# Patient Record
Sex: Male | Born: 1940 | Race: White | Hispanic: No | Marital: Married | Smoking: Never smoker
Health system: Southern US, Community
[De-identification: ages and names within clinical notes are randomized; demographics above are authoritative.]

## PROBLEM LIST (undated history)

## (undated) DIAGNOSIS — E119 Type 2 diabetes mellitus without complications: Secondary | ICD-10-CM

## (undated) DIAGNOSIS — T8859XA Other complications of anesthesia, initial encounter: Secondary | ICD-10-CM

## (undated) DIAGNOSIS — I499 Cardiac arrhythmia, unspecified: Secondary | ICD-10-CM

## (undated) DIAGNOSIS — R51 Headache: Secondary | ICD-10-CM

## (undated) DIAGNOSIS — R519 Headache, unspecified: Secondary | ICD-10-CM

## (undated) DIAGNOSIS — Z87442 Personal history of urinary calculi: Secondary | ICD-10-CM

## (undated) DIAGNOSIS — N200 Calculus of kidney: Secondary | ICD-10-CM

## (undated) DIAGNOSIS — D649 Anemia, unspecified: Secondary | ICD-10-CM

## (undated) DIAGNOSIS — I10 Essential (primary) hypertension: Secondary | ICD-10-CM

## (undated) DIAGNOSIS — C801 Malignant (primary) neoplasm, unspecified: Secondary | ICD-10-CM

## (undated) DIAGNOSIS — T4145XA Adverse effect of unspecified anesthetic, initial encounter: Secondary | ICD-10-CM

## (undated) DIAGNOSIS — I639 Cerebral infarction, unspecified: Secondary | ICD-10-CM

## (undated) HISTORY — PX: HERNIA REPAIR: SHX51

## (undated) HISTORY — DX: Calculus of kidney: N20.0

## (undated) HISTORY — PX: EYE SURGERY: SHX253

---

## 1987-01-11 HISTORY — PX: HAND SURGERY: SHX662

## 1998-12-15 ENCOUNTER — Ambulatory Visit (HOSPITAL_COMMUNITY): Admission: RE | Admit: 1998-12-15 | Discharge: 1998-12-15 | Payer: Self-pay

## 2003-04-01 ENCOUNTER — Encounter: Admission: RE | Admit: 2003-04-01 | Discharge: 2003-04-01 | Payer: Self-pay | Admitting: Internal Medicine

## 2004-02-16 ENCOUNTER — Other Ambulatory Visit: Payer: Self-pay

## 2004-02-17 ENCOUNTER — Observation Stay: Payer: Self-pay | Admitting: Internal Medicine

## 2004-11-22 ENCOUNTER — Emergency Department: Payer: Self-pay | Admitting: Emergency Medicine

## 2004-12-09 ENCOUNTER — Ambulatory Visit: Payer: Self-pay | Admitting: Unknown Physician Specialty

## 2006-06-01 ENCOUNTER — Ambulatory Visit: Payer: Self-pay | Admitting: Internal Medicine

## 2006-09-26 ENCOUNTER — Ambulatory Visit: Payer: Self-pay | Admitting: Urology

## 2006-10-07 ENCOUNTER — Encounter: Admission: RE | Admit: 2006-10-07 | Discharge: 2006-10-07 | Payer: Self-pay | Admitting: Internal Medicine

## 2007-03-27 ENCOUNTER — Ambulatory Visit: Payer: Self-pay | Admitting: Urology

## 2008-01-11 HISTORY — PX: HERNIA REPAIR: SHX51

## 2008-04-14 ENCOUNTER — Ambulatory Visit: Payer: Self-pay | Admitting: Surgery

## 2008-06-24 ENCOUNTER — Ambulatory Visit: Payer: Self-pay | Admitting: Urology

## 2011-01-21 ENCOUNTER — Ambulatory Visit: Payer: Self-pay | Admitting: Internal Medicine

## 2011-09-27 ENCOUNTER — Other Ambulatory Visit: Payer: Self-pay | Admitting: Podiatry

## 2011-10-02 LAB — WOUND CULTURE

## 2013-05-13 ENCOUNTER — Other Ambulatory Visit: Payer: Self-pay | Admitting: Internal Medicine

## 2013-05-13 DIAGNOSIS — M545 Low back pain, unspecified: Secondary | ICD-10-CM

## 2013-05-13 DIAGNOSIS — D510 Vitamin B12 deficiency anemia due to intrinsic factor deficiency: Secondary | ICD-10-CM | POA: Insufficient documentation

## 2013-05-13 DIAGNOSIS — G629 Polyneuropathy, unspecified: Secondary | ICD-10-CM | POA: Insufficient documentation

## 2013-05-13 DIAGNOSIS — Z139 Encounter for screening, unspecified: Secondary | ICD-10-CM

## 2013-05-16 ENCOUNTER — Ambulatory Visit
Admission: RE | Admit: 2013-05-16 | Discharge: 2013-05-16 | Disposition: A | Discharge status: Home / Self Care | Payer: No Typology Code available for payment source | Source: Ambulatory Visit | Attending: Internal Medicine | Admitting: Internal Medicine

## 2013-05-16 DIAGNOSIS — Z139 Encounter for screening, unspecified: Secondary | ICD-10-CM

## 2013-05-16 DIAGNOSIS — M545 Low back pain, unspecified: Secondary | ICD-10-CM

## 2013-06-17 DIAGNOSIS — M5416 Radiculopathy, lumbar region: Secondary | ICD-10-CM | POA: Insufficient documentation

## 2014-10-28 ENCOUNTER — Other Ambulatory Visit: Payer: Self-pay | Admitting: Neurology

## 2014-10-28 DIAGNOSIS — R404 Transient alteration of awareness: Secondary | ICD-10-CM

## 2014-10-29 ENCOUNTER — Ambulatory Visit
Admission: RE | Admit: 2014-10-29 | Discharge: 2014-10-29 | Disposition: A | Discharge status: Home / Self Care | Payer: Medicare Other | Source: Ambulatory Visit | Attending: Neurology | Admitting: Neurology

## 2014-10-29 DIAGNOSIS — R404 Transient alteration of awareness: Secondary | ICD-10-CM

## 2014-10-29 MED ORDER — GADOBENATE DIMEGLUMINE 529 MG/ML IV SOLN
19.0000 mL | Freq: Once | INTRAVENOUS | Status: AC | PRN
  Administered 2014-10-29: 19 mL via INTRAVENOUS

## 2015-08-18 ENCOUNTER — Other Ambulatory Visit: Payer: Self-pay | Admitting: Internal Medicine

## 2015-08-18 DIAGNOSIS — M5412 Radiculopathy, cervical region: Secondary | ICD-10-CM

## 2015-08-27 ENCOUNTER — Other Ambulatory Visit: Payer: Medicare Other

## 2015-09-15 ENCOUNTER — Ambulatory Visit
Admission: RE | Admit: 2015-09-15 | Discharge: 2015-09-15 | Disposition: A | Discharge status: Home / Self Care | Payer: Medicare Other | Source: Ambulatory Visit | Attending: Internal Medicine | Admitting: Internal Medicine

## 2015-09-15 ENCOUNTER — Other Ambulatory Visit: Payer: Self-pay | Admitting: Internal Medicine

## 2015-09-15 DIAGNOSIS — Z77018 Contact with and (suspected) exposure to other hazardous metals: Secondary | ICD-10-CM

## 2015-09-15 DIAGNOSIS — M5412 Radiculopathy, cervical region: Secondary | ICD-10-CM

## 2015-11-19 DIAGNOSIS — I10 Essential (primary) hypertension: Secondary | ICD-10-CM | POA: Insufficient documentation

## 2015-11-19 DIAGNOSIS — M5010 Cervical disc disorder with radiculopathy, unspecified cervical region: Secondary | ICD-10-CM | POA: Insufficient documentation

## 2016-08-06 ENCOUNTER — Emergency Department: Payer: Medicare Other

## 2016-08-06 ENCOUNTER — Emergency Department
Admission: EM | Admit: 2016-08-06 | Discharge: 2016-08-06 | Disposition: A | Discharge status: Home / Self Care | Payer: Medicare Other | Attending: Emergency Medicine | Admitting: Emergency Medicine

## 2016-08-06 DIAGNOSIS — Y9279 Other farm location as the place of occurrence of the external cause: Secondary | ICD-10-CM | POA: Diagnosis not present

## 2016-08-06 DIAGNOSIS — S92001A Unspecified fracture of right calcaneus, initial encounter for closed fracture: Secondary | ICD-10-CM | POA: Diagnosis not present

## 2016-08-06 DIAGNOSIS — W1789XA Other fall from one level to another, initial encounter: Secondary | ICD-10-CM | POA: Diagnosis not present

## 2016-08-06 DIAGNOSIS — Y93H9 Activity, other involving exterior property and land maintenance, building and construction: Secondary | ICD-10-CM | POA: Diagnosis not present

## 2016-08-06 DIAGNOSIS — Y999 Unspecified external cause status: Secondary | ICD-10-CM | POA: Diagnosis not present

## 2016-08-06 DIAGNOSIS — S92009A Unspecified fracture of unspecified calcaneus, initial encounter for closed fracture: Secondary | ICD-10-CM

## 2016-08-06 DIAGNOSIS — R52 Pain, unspecified: Secondary | ICD-10-CM

## 2016-08-06 DIAGNOSIS — S99911A Unspecified injury of right ankle, initial encounter: Secondary | ICD-10-CM | POA: Diagnosis present

## 2016-08-06 MED ORDER — OXYCODONE-ACETAMINOPHEN 5-325 MG PO TABS
1.0000 | ORAL_TABLET | Freq: Once | ORAL | Status: AC
  Administered 2016-08-06: 1 via ORAL
  Filled 2016-08-06: qty 1

## 2016-08-06 MED ORDER — ONDANSETRON HCL 4 MG/2ML IJ SOLN
4.0000 mg | Freq: Once | INTRAMUSCULAR | Status: AC
  Administered 2016-08-06: 4 mg via INTRAVENOUS
  Filled 2016-08-06: qty 2

## 2016-08-06 MED ORDER — OXYCODONE-ACETAMINOPHEN 5-325 MG PO TABS
1.0000 | ORAL_TABLET | Freq: Four times a day (QID) | ORAL | 0 refills | Status: DC | PRN
Start: 2016-08-06 — End: 2017-10-11

## 2016-08-06 MED ORDER — ONDANSETRON 4 MG PO TBDP: 4.0000 mg | ORAL_TABLET | Freq: Three times a day (TID) | ORAL | 0 refills | Status: DC | PRN

## 2016-08-06 NOTE — Discharge Instructions (Signed)
Please call the number provided for Dr. Sabra Heck for a follow-up appointment as soon as possible. Do not bear weight on your right ankle. Use crutches while walking. Please take your pain medication as needed, as written.

## 2016-08-06 NOTE — ED Notes (Signed)
Kurt Hernandez is in with pt to place splint. Crutches in room.

## 2016-08-06 NOTE — ED Triage Notes (Signed)
Pt was on the trailer helping to push off bales of hay (1400lb bales) had to jump off trailer out of the way of a falling bale and as he landed (approx jumping 3-4') he heard a pop and had pain and felt nauseated and sweaty.

## 2016-08-06 NOTE — ED Notes (Signed)
Pt taken out to car by tech after splint applied

## 2016-08-06 NOTE — ED Provider Notes (Signed)
[facility] Emergency Department Provider Note  Time seen: 7:42 PM  I have reviewed the triage vital signs and the nursing notes.   HISTORY  Chief Complaint Ankle Pain    HPI Kurt Hernandez is a 76 y.o. male with a very minimal past medical history of palpitations who presents to the emergency department with right ankle pain. According to the patient he was loading hay, states he slipped and had to jump off the top of the [employer] trailer approximately 8 feet off the ground. Patient states he landed on his right leg and rolled forwards. States immediate pain to the right heel denies any other injuries. Did not hit his head or pass out. Denies vomiting. States significant pain with attempted ambulation.  No past medical history on file.  There are no active problems to display for this patient.   No past surgical history on file.  Prior to Admission medications   Not on File    Not on File  No family history on file.  Social History Social History  Substance Use Topics  . Smoking status: Not on file  . Smokeless tobacco: Not on file  . Alcohol use Not on file    Review of Systems Constitutional: Negative for fever. Cardiovascular: Negative for chest pain. Respiratory: Negative for shortness of breath. Gastrointestinal: Negative for abdominal pain Genitourinary: Negative for dysuria. Musculoskeletal: Right heel pain Neurological: Negative for headache All other ROS negative  ____________________________________________   PHYSICAL EXAM:  VITAL SIGNS: ED Triage Vitals [08/06/16 1856]  Enc Vitals Group     BP 137/75     Pulse Rate (!) 51     Resp 18     Temp 98 F (36.7 C)     Temp src      SpO2 100 %     Weight 212 lb (96.2 kg)     Height 6\' 2"  (1.88 m)     Head Circumference      Peak Flow      Pain Score 3     Pain Loc      Pain Edu?      Excl. in [place]?     Constitutional: Alert and oriented. Well appearing and in no  distress. Eyes: Normal exam ENT   Head: Normocephalic and atraumatic.   Mouth/Throat: Mucous membranes are moist. Cardiovascular: Normal rate, regular rhythm. No murmur Respiratory: Normal respiratory effort without tachypnea nor retractions. Breath sounds are clear  Gastrointestinal: Soft and nontender. No distention. Musculoskeletal: Moderate tenderness of the right calcaneus. Neurologic:  Normal speech and language. No gross focal neurologic deficits are appreciated. Skin:  Skin is warm, dry and intact.  Psychiatric: Mood and affect are normal. Speech and behavior are normal.   ____________________________________________   RADIOLOGY   IMPRESSION: Comminuted and impacted fractures of the right calcaneus.  ____________________________________________   INITIAL IMPRESSION / ASSESSMENT AND PLAN / ED COURSE  Pertinent labs & imaging results that were available during my care of the patient were reviewed by me and considered in my medical decision making (see chart for details).  Patient presents to the emergency department with right heel pain after a fall from approximately 8 feet. No other injuries identified on examination. No other pain complaints. Patient has a comminuted and impacted right calcaneal fracture. I discussed the patient with orthopedics. They recommend splint/boot, nonweightbearing status. Patient sees Dr. Sabra Heck as his orthopedist we will have him follow-up with Dr. Sabra Heck this week. We will discharge pain medication. Orthopedics  recommends obtaining a CT scan in the emergency department which will be useful for possible operative planning if needed by Dr. Sabra Heck. Patient agreeable to this plan. States moderate pain at this time we will discharge with Percocet.  ____________________________________________   FINAL CLINICAL IMPRESSION(S) / ED DIAGNOSES  Right calcaneus fracture    Harvest Dark, MD 08/06/16 2010

## 2016-08-11 ENCOUNTER — Emergency Department
Admission: EM | Admit: 2016-08-11 | Discharge: 2016-08-11 | Disposition: A | Discharge status: Home / Self Care | Payer: Medicare Other | Attending: Emergency Medicine | Admitting: Emergency Medicine

## 2016-08-11 ENCOUNTER — Emergency Department: Payer: Medicare Other

## 2016-08-11 ENCOUNTER — Encounter: Payer: Self-pay | Admitting: Emergency Medicine

## 2016-08-11 DIAGNOSIS — R339 Retention of urine, unspecified: Secondary | ICD-10-CM | POA: Insufficient documentation

## 2016-08-11 DIAGNOSIS — Z79899 Other long term (current) drug therapy: Secondary | ICD-10-CM | POA: Insufficient documentation

## 2016-08-11 DIAGNOSIS — K5900 Constipation, unspecified: Secondary | ICD-10-CM

## 2016-08-11 DIAGNOSIS — K5641 Fecal impaction: Secondary | ICD-10-CM | POA: Insufficient documentation

## 2016-08-11 DIAGNOSIS — I10 Essential (primary) hypertension: Secondary | ICD-10-CM | POA: Insufficient documentation

## 2016-08-11 HISTORY — DX: Essential (primary) hypertension: I10

## 2016-08-11 HISTORY — DX: Cardiac arrhythmia, unspecified: I49.9

## 2016-08-11 MED ORDER — MAGNESIUM CITRATE PO SOLN
0.5000 | Freq: Once | ORAL | Status: AC
  Administered 2016-08-11: 0.5 via ORAL
  Filled 2016-08-11: qty 296

## 2016-08-11 MED ORDER — DOCUSATE SODIUM 50 MG/5ML PO LIQD
100.0000 mg | Freq: Once | ORAL | Status: AC
  Administered 2016-08-11: 100 mg
  Filled 2016-08-11: qty 10

## 2016-08-11 NOTE — ED Notes (Signed)
1/2 enema given at this time.  Pt tolerating only half at this time, stated he was starting to feel nauseated.  Pt instructed to hold liquid for as long as possible and to call out when he felt that he needed to have a BM.  Call bell given to patient.  Sign placed on patient's door for privacy.  Pt verbalized understanding of using call bell.

## 2016-08-11 NOTE — ED Triage Notes (Addendum)
Pt here Saturday because broke leg.  Took 3 hydrocodone but has not had bowel movement since then.  Tried fleets over the counter and stool softener without relief.  No vomiting but has had some mild nausea.  Pt feels like most of stool is stuck at rectum.

## 2016-08-11 NOTE — ED Notes (Signed)
Pt had BM on toilet, pt states that he feels relief in his stomach, but doesn't know if he passed all of the stool.  Pt states that he feels like his butt is extended outward and needs to retract back.  Pt asked if he has a history of hemorrhoids and he states no.  Pt states he is no longer having pain like he was prior to coming in.

## 2016-08-11 NOTE — ED Provider Notes (Signed)
[facility] Emergency Department Provider Note  ____________________________________________   First MD Initiated Contact with Patient 08/11/16 1103     (approximate)  I have reviewed the triage vital signs and the nursing notes.   HISTORY  Chief Complaint Constipation   HPI Kurt Hernandez is a 76 y.o. male with a history of recent right foot fracture who is presenting to the emergency department with constipation. He says that he normally has a bowel movement every morning but has not had a bowel movement since this past Saturday, 5 days ago. He says that he now feels like there is something in his rectum that he just can't push out. He has tried to disimpact himself at home and has been able to get a minimal amount of stool out but is still unable to have a normal bowel movement. After breaking his foot he has been walking minimally because of recommendations not to bear any weight to the right foot. He is also been taking hydrocodone. He has had no other changes in his diet. He has pain that feels like a pressure type pain in his suprapubic region. Has not had any vomiting. Says he is able to urinate.Patient says that he has been taking stool softeners but without relief.   Past Medical History:  Diagnosis Date  . Arrhythmia   . Hypertension     There are no active problems to display for this patient.   Past Surgical History:  Procedure Laterality Date  . HAND SURGERY    . HERNIA REPAIR      Prior to Admission medications   Medication Sig Start Date End Date Taking? Authorizing Provider  gabapentin (NEURONTIN) 300 MG capsule Take 300 mg by mouth 3 (three) times daily. 08/03/16  Yes [provider]  metoprolol succinate (TOPROL-XL) 25 MG 24 hr tablet Take 12.5 mg by mouth 2 (two) times daily. 07/20/16  Yes [provider]  olmesartan-hydrochlorothiazide (BENICAR HCT) 20-12.5 MG tablet Take 1 tablet by mouth daily. 06/01/16  Yes  [provider]  ondansetron (ZOFRAN ODT) 4 MG disintegrating tablet Take 1 tablet (4 mg total) by mouth every 8 (eight) hours as needed for nausea or vomiting. 08/06/16  Yes Harvest Dark, MD  oxyCODONE-acetaminophen (ROXICET) 5-325 MG tablet Take 1 tablet by mouth every 6 (six) hours as needed. 08/06/16  Yes Harvest Dark, MD    Allergies Patient has no known allergies.  History reviewed. No pertinent family history.  Social History Social History  Substance Use Topics  . Smoking status: Never Smoker  . Smokeless tobacco: Never Used  . Alcohol use No    Review of Systems  Constitutional: No fever/chills Eyes: No visual changes. ENT: No sore throat. Cardiovascular: Denies chest pain. Respiratory: Denies shortness of breath. Gastrointestinal:  No nausea, no vomiting.  No diarrhea.   Genitourinary: Negative for dysuria. Musculoskeletal: Negative for back pain. Skin: Negative for rash. Neurological: Negative for headaches, focal weakness or numbness.   ____________________________________________   PHYSICAL EXAM:  VITAL SIGNS: ED Triage Vitals  Enc Vitals Group     BP 08/11/16 1037 101/64     Pulse Rate 08/11/16 1037 64     Resp 08/11/16 1037 18     Temp 08/11/16 1036 98.1 F (36.7 C)     Temp Source 08/11/16 1036 Oral     SpO2 08/11/16 1037 96 %     Weight 08/11/16 1037 212 lb (96.2 kg)     Height 08/11/16 1037 6\' 2"  (1.88  m)     Head Circumference --      Peak Flow --      Pain Score 08/11/16 1036 8     Pain Loc --      Pain Edu? --      Excl. in [place]? --     Constitutional: Alert and oriented. Well appearing and in no acute distress. Eyes: Conjunctivae are normal.  Head: Atraumatic. Nose: No congestion/rhinnorhea. Mouth/Throat: Mucous membranes are moist.  Neck: No stridor.   Cardiovascular: Normal rate, regular rhythm. Grossly normal heart sounds. Respiratory: Normal respiratory effort.  No retractions. Lungs CTAB. Gastrointestinal:  Soft and nontender. No distention.   Digital rectal exam with stool that is high up in the rectum that I'm unable to disimpact. The stool is hard and there appears to be a large mass there. There is a small amount of liquid stool that came out of the patient's rectum after the exam. Stool is grossly brown.  Musculoskeletal: No lower extremity tenderness nor edema.  No joint effusions. Neurologic:  Normal speech and language. No gross focal neurologic deficits are appreciated. Skin:  Skin is warm, dry and intact. No rash noted. Psychiatric: Mood and affect are normal. Speech and behavior are normal.  ____________________________________________   LABS (all labs ordered are listed, but only abnormal results are displayed)  Labs Reviewed - No data to display ____________________________________________  EKG   ____________________________________________  RADIOLOGY  Abdominal x-ray with a right upper pole nephrolithiasis. ____________________________________________   PROCEDURES  Procedure(s) performed:   Procedures  Critical Care performed:   ____________________________________________   INITIAL IMPRESSION / ASSESSMENT AND PLAN / ED COURSE  Pertinent labs & imaging results that were available during my care of the patient were reviewed by me and considered in my medical decision making (see chart for details).    Clinical Course as of Aug 11 1605  Thu Aug 11, 2016  1111 DG Abdomen 1 View [DS]  2440 DG Abdomen 1 View [DS]  1027 DG Abdomen 1 View [DS]    Clinical Course User Index [DS] Orbie Pyo, MD   ----------------------------------------- 11:40 AM on 08/11/2016 -----------------------------------------  Patient to receive enema.  ----------------------------------------- 2:59 PM on 08/11/2016 -----------------------------------------  Patient had some success with the enema but was only able to tolerate half of the back. He says now that  he is unable to urinate and still feels a pressure-like sensation in his abdomen and also like his rectum is coming out. However, after resting he says that the sensation has resolved.  I reexamined his rectum and there is no prolapse. However, on digital exam I can still feel hard stool high up in the rectum. He had a bladder scan showing greater than 500 cc of urine that he was retaining. Likely caused by the persistent fecal impaction.  ----------------------------------------- 4:07 PM on 08/11/2016 -----------------------------------------  After the second enema patient had a large bowel movement and was also able to void. He is feeling much improved. He'll be discharged home. I discussed with him and his family to stop taking his hydrocodone and also to make sure to drink water as well as supplement his diet with fiber. He is understanding of this plan and willing to comply. He will continue as needed with his over-the-counter stool softeners, he is currently taking senna but we discussed advancing to MiraLAX or magnesium if needed. The patient is understanding of this plan and willing to comply. ____________________________________________   FINAL CLINICAL IMPRESSION(S) / ED DIAGNOSES  Fecal  impaction.    NEW MEDICATIONS STARTED DURING THIS VISIT:  New Prescriptions   No medications on file     Note:  This document was prepared using Dragon voice recognition software and may include unintentional dictation errors.     Orbie Pyo, MD 08/11/16 [phone]

## 2016-08-11 NOTE — ED Notes (Signed)
Administered other 1/2 of enema to patient.  Pt tolerated well.  Pt educated to call when he needs to have BM.  Verbalized understanding.

## 2016-08-11 NOTE — ED Notes (Signed)
Spoke with pharmacy regarding colace liquid, pharmacy to send to ER.

## 2016-08-11 NOTE — ED Notes (Signed)
Pt voided and had a bm. md aware.  Pt states he feels much better now.  Family with pt.  Pt alert.

## 2016-08-24 DIAGNOSIS — I493 Ventricular premature depolarization: Secondary | ICD-10-CM | POA: Insufficient documentation

## 2016-11-21 DIAGNOSIS — E039 Hypothyroidism, unspecified: Secondary | ICD-10-CM | POA: Insufficient documentation

## 2017-09-08 DIAGNOSIS — M189 Osteoarthritis of first carpometacarpal joint, unspecified: Secondary | ICD-10-CM | POA: Insufficient documentation

## 2017-09-08 DIAGNOSIS — M19039 Primary osteoarthritis, unspecified wrist: Secondary | ICD-10-CM | POA: Insufficient documentation

## 2017-09-08 DIAGNOSIS — S92009A Unspecified fracture of unspecified calcaneus, initial encounter for closed fracture: Secondary | ICD-10-CM | POA: Insufficient documentation

## 2017-09-08 DIAGNOSIS — M7670 Peroneal tendinitis, unspecified leg: Secondary | ICD-10-CM | POA: Insufficient documentation

## 2017-10-11 ENCOUNTER — Ambulatory Visit (INDEPENDENT_AMBULATORY_CARE_PROVIDER_SITE_OTHER): Payer: Medicare Other | Admitting: Urology

## 2017-10-11 ENCOUNTER — Ambulatory Visit
Admission: RE | Admit: 2017-10-11 | Discharge: 2017-10-11 | Disposition: A | Discharge status: Home / Self Care | Payer: Medicare Other | Source: Ambulatory Visit | Attending: Urology | Admitting: Urology

## 2017-10-11 ENCOUNTER — Other Ambulatory Visit: Payer: Self-pay | Admitting: Urology

## 2017-10-11 ENCOUNTER — Encounter: Payer: Self-pay | Admitting: Urology

## 2017-10-11 VITALS — BP 138/82 | HR 69 | Ht 74.0 in | Wt 194.1 lb

## 2017-10-11 DIAGNOSIS — N200 Calculus of kidney: Secondary | ICD-10-CM

## 2017-10-11 DIAGNOSIS — R109 Unspecified abdominal pain: Secondary | ICD-10-CM

## 2017-10-11 DIAGNOSIS — N281 Cyst of kidney, acquired: Secondary | ICD-10-CM | POA: Insufficient documentation

## 2017-10-11 DIAGNOSIS — N400 Enlarged prostate without lower urinary tract symptoms: Secondary | ICD-10-CM | POA: Insufficient documentation

## 2017-10-11 DIAGNOSIS — R3129 Other microscopic hematuria: Secondary | ICD-10-CM | POA: Diagnosis not present

## 2017-10-11 DIAGNOSIS — K5730 Diverticulosis of large intestine without perforation or abscess without bleeding: Secondary | ICD-10-CM | POA: Diagnosis not present

## 2017-10-11 LAB — URINALYSIS, COMPLETE
Bilirubin, UA: NEGATIVE
Glucose, UA: NEGATIVE
Ketones, UA: NEGATIVE
LEUKOCYTES UA: NEGATIVE
Nitrite, UA: NEGATIVE
PH UA: 5.5 (ref 5.0–7.5)
Specific Gravity, UA: >1.03 — ABNORMAL HIGH (ref 1.005–1.030)
Urobilinogen, Ur: 0.2 mg/dL (ref 0.2–1.0)

## 2017-10-11 LAB — MICROSCOPIC EXAMINATION: EPITHELIAL CELLS (NON RENAL): NONE SEEN /HPF (ref 0–10)

## 2017-10-11 NOTE — H&P (View-Only) (Signed)
10/11/2017 9:49 PM   Kurt Hernandez 1940-01-12 [id number]  Referring provider: Rusty Aus, MD [facility] [place], [place] [address]  Chief Complaint  Patient presents with  . Nephrolithiasis    HPI: Patient is a 77 year old Caucasian male who was referred by Dr. Emily Filbert for nephrolithiasis.    He was seen on 09/22/2017 by Dr. Sabra Heck for one month of right lower back pain that radiates around to the right groin.  He had an x-ray and I cannot view the films or the report.     Labs with Dr. Sabra Heck:  RBC's 0-3 and 0-3 WBC's.     Meds given by Dr. Sabra Heck:  tamsulosin 0.4 mg daily  Prior urological history:  He was followed by Dr. Quillian Quince, Dr. Karsten Ro and Dr. Bernardo Heater for nephrolithiasis.     Current NSAID/anticoagulation:   Mobic and ASA   Today, he is having right colicky pain in the flank radiating to the right waist.   Pain is 8/10.  The pain can last for several minutes.  He has not noted anything that helps the pain or makes it worse.  Patient denies any gross hematuria, dysuria or suprapubic/flank pain.  Patient denies any fevers, chills, nausea or vomiting.   STAT CT Renal stone study on 10/11/2017 revealed 10 x 9 mm upper pole right renal calculus but no obstructing ureteral calculi or bladder calculi.  Right renal cysts but no worrisome renal lesions without contrast.  No acute abdominal/pelvic findings, mass lesions or adenopathy.  Colonic diverticulosis without findings for acute diverticulitis.  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  His UA today is negative.    PMH: Past Medical History:  Diagnosis Date  . Arrhythmia   . Hypertension     Surgical History: Past Surgical History:  Procedure Laterality Date  . HAND SURGERY    . HERNIA REPAIR    . HERNIA REPAIR      Home Medications:  Allergies as of 10/11/2017   No Known Allergies     Medication  List        Accurate as of 10/11/17 11:59 PM. Always use your most recent med list.          aspirin 81 MG tablet Take 81 mg by mouth daily.   gabapentin 300 MG capsule Commonly known as:  NEURONTIN Take 300 mg by mouth 3 (three) times daily.   meloxicam 7.5 MG tablet Commonly known as:  MOBIC Take 7.5 mg by mouth daily.   metoprolol succinate 25 MG 24 hr tablet Commonly known as:  TOPROL-XL Take 12.5 mg by mouth 2 (two) times daily.   vitamin B-12 500 MCG tablet Commonly known as:  CYANOCOBALAMIN Take 500 mcg by mouth daily.       Allergies: No Known Allergies  Family History: Family History  Problem Relation Age of Onset  . Heart attack Father   . Stroke Mother     Social History:  reports that he has never smoked. He has never used smokeless tobacco. He reports that he does not drink alcohol or use drugs.  ROS: UROLOGY Frequent Urination?: No Hard to postpone urination?: No Burning/pain with urination?: No Get up at night to urinate?: No Leakage of urine?: Yes Urine stream starts and stops?: Yes Trouble starting stream?: No Do you have to strain to urinate?: No Blood in urine?: No Urinary tract infection?: No Sexually transmitted disease?: No Injury  to kidneys or bladder?: No Painful intercourse?: No Weak stream?: Yes Erection problems?: No Penile pain?: No  Gastrointestinal Nausea?: No Vomiting?: No Indigestion/heartburn?: No Diarrhea?: No Constipation?: No  Constitutional Fever: No Night sweats?: No Weight loss?: Yes Fatigue?: Yes  Skin Skin rash/lesions?: No Itching?: No  Eyes Blurred vision?: Yes Double vision?: No  Ears/Nose/Throat Sore throat?: No Sinus problems?: No  Hematologic/Lymphatic Swollen glands?: No Easy bruising?: Yes  Cardiovascular Leg swelling?: No Chest pain?: No  Respiratory Cough?: No Shortness of breath?: No  Endocrine Excessive thirst?: No  Musculoskeletal Back pain?: Yes Joint pain?:  Yes  Neurological Headaches?: No Dizziness?: No  Psychologic Depression?: No Anxiety?: No  Physical Exam: BP 138/82 (BP Location: Left Arm, Patient Position: Sitting, Cuff Size: Normal)   Pulse 69   Ht 6\' 2"  (1.88 m)   Wt 194 lb 1.6 oz (88 kg)   BMI 24.92 kg/m   Constitutional:  Well nourished. Alert and oriented, No acute distress. HEENT: [place] AT, moist mucus membranes.  Trachea midline, no masses. Cardiovascular: No clubbing, cyanosis, or edema. Respiratory: Normal respiratory effort, no increased work of breathing. GI: Abdomen is soft, non tender, non distended, no abdominal masses. Liver and spleen not palpable.  No hernias appreciated.  Stool sample for occult testing is not indicated.   GU: Right CVA tenderness.  No bladder fullness or masses.   Skin: No rashes, bruises or suspicious lesions. Lymph: No cervical or inguinal adenopathy. Neurologic: Grossly intact, no focal deficits, moving all 4 extremities. Psychiatric: Normal mood and affect.  Laboratory Data: Lab Results  Component Value Date   WBC WILL FOLLOW 10/11/2017   HGB WILL FOLLOW 10/11/2017   HCT WILL FOLLOW 10/11/2017   MCV WILL FOLLOW 10/11/2017    Lab Results  Component Value Date   CREATININE 1.29 (H) 10/11/2017    No results found for: PSA  No results found for: TESTOSTERONE  No results found for: HGBA1C  No results found for: TSH  No results found for: CHOL, HDL, CHOLHDL, VLDL, LDLCALC  No results found for: AST No results found for: ALT No components found for: ALKALINEPHOPHATASE No components found for: BILIRUBINTOTAL  No results found for: ESTRADIOL  Urinalysis    Component Value Date/Time   APPEARANCEUR Clear 10/11/2017 1445   GLUCOSEU Negative 10/11/2017 1445   BILIRUBINUR Negative 10/11/2017 1445   PROTEINUR 1+ (A) 10/11/2017 1445   NITRITE Negative 10/11/2017 1445   LEUKOCYTESUR Negative 10/11/2017 1445    I have reviewed the labs.   Pertinent Imaging: CLINICAL DATA:   Right flank pain for 1 month.  EXAM: CT ABDOMEN AND PELVIS WITHOUT CONTRAST  TECHNIQUE: Multidetector CT imaging of the abdomen and pelvis was performed following the standard protocol without IV contrast.  COMPARISON:  CT scan of 01/21/2011  FINDINGS: Lower chest: The lung bases are clear of an acute process. No worrisome pulmonary lesions. The heart is normal in size. No pericardial effusion.  Hepatobiliary: Simple appearing cyst at the hepatic dome. No worrisome hepatic lesions without IV contrast. Small calcified granuloma is noted in the lower aspect of the right hepatic lobe. The gallbladder is normal. No common bile duct dilatation.  Pancreas: No mass, inflammation or ductal dilatation. Prominent fatty interstices.  Spleen: Normal size.  No focal lesions.  Adrenals/Urinary Tract: The adrenal glands are normal.  10 x 9 mm upper pole right renal calculus but no obstructing ureteral calculi. No left-sided renal or ureteral calculi. Simple appearing right renal cysts are noted. No worrisome renal lesions without  IV contrast. No bladder calculi or bladder lesions.  Stomach/Bowel: The stomach, duodenum, small bowel and colon are grossly normal without oral contrast. No inflammatory changes, mass lesions or obstructive findings. The terminal ileum and appendix are normal. Colonic diverticulosis without findings for acute diverticulitis.  Vascular/Lymphatic: Scattered atherosclerotic calcifications involving the abdominal aorta and iliac arteries but no aneurysm. Small scattered mesenteric and retroperitoneal lymph nodes but no mass or adenopathy.  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 The seminal vesicles are grossly normal.  Other: Evidence of prior lower abdominal wall hernia repair with mesh. No recurrent abdominal wall or inguinal hernias. No pelvic mass or adenopathy. No  inguinal adenopathy.  Musculoskeletal: No significant bony findings. 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  IMPRESSION: 1. 10 x 9 mm upper pole right renal calculus but no obstructing ureteral calculi or bladder calculi. 2. Right renal cysts but no worrisome renal lesions without contrast. 3. No acute abdominal/pelvic findings, mass lesions or adenopathy. 4. Colonic diverticulosis without findings for acute diverticulitis. 5. 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   Electronically Signed   By: Marijo Sanes M.D.   On: 10/11/2017 15:53 I have independently reviewed the films.    Assessment & Plan:    1. Right renal stone/right flank pain Discussed with the patient possible approaches to treating his right renal stone such as ESWL, PCNL and URS.   He has a friend who has undergone URS in the past and was quite pleased with results and therefore the patient is wanting to pursue URS at this time schedule right ureteroscopy with laser lithotripsy and ureteral stent placement explained to the patient how the procedure is performed and the risks involved informed patient that they will have a stent placed during the procedure and will remain in place after the procedure for a short time.  stent may be removed in the office with a cystoscope or patient may be instructed to remove the stent themselves by the string described "stent pain" as feelings of needing to urinate/overactive bladder and a warm, tingling sensation to intense pain in the affected flank residual stones within the kidney or ureter may be present after the procedure and may need to have these addressed at a different encounter injury to the ureter is the most common intra-operative risk, it may result in an open procedure to correct the defect infection and bleeding are also risks explained the risks of general anesthesia, such as:  MI, CVA, paralysis, coma and/or death. advised to contact our office or seek treatment in the ED if becomes febrile or pain/ vomiting are difficult control in order to arrange for emergent/urgent intervention obtain RUS to ensure the hydronephrosis has resolved once they have passed and/or recovered from procedure to ensure to iatrogenic hydronephrosis remains - it is explained to the patient that it is important to document resolution of the hydronephrosis as "silent hydronephrosis" can occur and cause damage and/or loss of the kidney  2. Microscopic hematuria  - UA today demonstrates 0-2 RBC's  - continue to monitor the patient's UA after the treatment/passage of the stone to ensure the hematuria has resolved  - if hematuria persists, we will pursue a hematuria workup with CT Urogram and cystoscopy if appropriate.    Return for right URS/LL/ureteral stent .  These notes generated with voice recognition software. I apologize for typographical errors.  Rudolph Daoust,  PA-C  [facility] [address]  [place], [place] [address] [phone]

## 2017-10-11 NOTE — Progress Notes (Signed)
10/11/2017 9:49 PM   Kurt Hernandez Dec Dec 15, 1940 [id number]  Referring provider: Rusty Aus, MD [facility] [place], [place] [address]  Chief Complaint  Patient presents with  . Nephrolithiasis    HPI: Patient is a 77 year old Caucasian male who was referred by Dr. Emily Filbert for nephrolithiasis.    He was seen on 09/22/2017 by Dr. Sabra Heck for one month of right lower back pain that radiates around to the right groin.  He had an x-ray and I cannot view the films or the report.     Labs with Dr. Sabra Heck:  RBC's 0-3 and 0-3 WBC's.     Meds given by Dr. Sabra Heck:  tamsulosin 0.4 mg daily  Prior urological history:  He was followed by Dr. Quillian Quince, Dr. Karsten Ro and Dr. Bernardo Heater for nephrolithiasis.     Current NSAID/anticoagulation:   Mobic and ASA   Today, he is having right colicky pain in the flank radiating to the right waist.   Pain is 8/10.  The pain can last for several minutes.  He has not noted anything that helps the pain or makes it worse.  Patient denies any gross hematuria, dysuria or suprapubic/flank pain.  Patient denies any fevers, chills, nausea or vomiting.   STAT CT Renal stone study on 10/11/2017 revealed 10 x 9 mm upper pole right renal calculus but no obstructing ureteral calculi or bladder calculi.  Right renal cysts but no worrisome renal lesions without contrast.  No acute abdominal/pelvic findings, mass lesions or adenopathy.  Colonic diverticulosis without findings for acute diverticulitis.  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  His UA today is negative.    PMH: Past Medical History:  Diagnosis Date  . Arrhythmia   . Hypertension     Surgical History: Past Surgical History:  Procedure Laterality Date  . HAND SURGERY    . HERNIA REPAIR    . HERNIA REPAIR      Home Medications:  Allergies as of 10/11/2017   No Known Allergies     Medication  List        Accurate as of 10/11/17 11:59 PM. Always use your most recent med list.          aspirin 81 MG tablet Take 81 mg by mouth daily.   gabapentin 300 MG capsule Commonly known as:  NEURONTIN Take 300 mg by mouth 3 (three) times daily.   meloxicam 7.5 MG tablet Commonly known as:  MOBIC Take 7.5 mg by mouth daily.   metoprolol succinate 25 MG 24 hr tablet Commonly known as:  TOPROL-XL Take 12.5 mg by mouth 2 (two) times daily.   vitamin B-12 500 MCG tablet Commonly known as:  CYANOCOBALAMIN Take 500 mcg by mouth daily.       Allergies: No Known Allergies  Family History: Family History  Problem Relation Age of Onset  . Heart attack Father   . Stroke Mother     Social History:  reports that he has never smoked. He has never used smokeless tobacco. He reports that he does not drink alcohol or use drugs.  ROS: UROLOGY Frequent Urination?: No Hard to postpone urination?: No Burning/pain with urination?: No Get up at night to urinate?: No Leakage of urine?: Yes Urine stream starts and stops?: Yes Trouble starting stream?: No Do you have to strain to urinate?: No Blood in urine?: No Urinary tract infection?: No Sexually transmitted disease?: No Injury  to kidneys or bladder?: No Painful intercourse?: No Weak stream?: Yes Erection problems?: No Penile pain?: No  Gastrointestinal Nausea?: No Vomiting?: No Indigestion/heartburn?: No Diarrhea?: No Constipation?: No  Constitutional Fever: No Night sweats?: No Weight loss?: Yes Fatigue?: Yes  Skin Skin rash/lesions?: No Itching?: No  Eyes Blurred vision?: Yes Double vision?: No  Ears/Nose/Throat Sore throat?: No Sinus problems?: No  Hematologic/Lymphatic Swollen glands?: No Easy bruising?: Yes  Cardiovascular Leg swelling?: No Chest pain?: No  Respiratory Cough?: No Shortness of breath?: No  Endocrine Excessive thirst?: No  Musculoskeletal Back pain?: Yes Joint pain?:  Yes  Neurological Headaches?: No Dizziness?: No  Psychologic Depression?: No Anxiety?: No  Physical Exam: BP 138/82 (BP Location: Left Arm, Patient Position: Sitting, Cuff Size: Normal)   Pulse 69   Ht 6\' 2"  (1.88 m)   Wt 194 lb 1.6 oz (88 kg)   BMI 24.92 kg/m   Constitutional:  Well nourished. Alert and oriented, No acute distress. HEENT: [place] AT, moist mucus membranes.  Trachea midline, no masses. Cardiovascular: No clubbing, cyanosis, or edema. Respiratory: Normal respiratory effort, no increased work of breathing. GI: Abdomen is soft, non tender, non distended, no abdominal masses. Liver and spleen not palpable.  No hernias appreciated.  Stool sample for occult testing is not indicated.   GU: Right CVA tenderness.  No bladder fullness or masses.   Skin: No rashes, bruises or suspicious lesions. Lymph: No cervical or inguinal adenopathy. Neurologic: Grossly intact, no focal deficits, moving all 4 extremities. Psychiatric: Normal mood and affect.  Laboratory Data: Lab Results  Component Value Date   WBC WILL FOLLOW 10/11/2017   HGB WILL FOLLOW 10/11/2017   HCT WILL FOLLOW 10/11/2017   MCV WILL FOLLOW 10/11/2017    Lab Results  Component Value Date   CREATININE 1.29 (H) 10/11/2017    No results found for: PSA  No results found for: TESTOSTERONE  No results found for: HGBA1C  No results found for: TSH  No results found for: CHOL, HDL, CHOLHDL, VLDL, LDLCALC  No results found for: AST No results found for: ALT No components found for: ALKALINEPHOPHATASE No components found for: BILIRUBINTOTAL  No results found for: ESTRADIOL  Urinalysis    Component Value Date/Time   APPEARANCEUR Clear 10/11/2017 1445   GLUCOSEU Negative 10/11/2017 1445   BILIRUBINUR Negative 10/11/2017 1445   PROTEINUR 1+ (A) 10/11/2017 1445   NITRITE Negative 10/11/2017 1445   LEUKOCYTESUR Negative 10/11/2017 1445    I have reviewed the labs.   Pertinent Imaging: CLINICAL DATA:   Right flank pain for 1 month.  EXAM: CT ABDOMEN AND PELVIS WITHOUT CONTRAST  TECHNIQUE: Multidetector CT imaging of the abdomen and pelvis was performed following the standard protocol without IV contrast.  COMPARISON:  CT scan of 01/21/2011  FINDINGS: Lower chest: The lung bases are clear of an acute process. No worrisome pulmonary lesions. The heart is normal in size. No pericardial effusion.  Hepatobiliary: Simple appearing cyst at the hepatic dome. No worrisome hepatic lesions without IV contrast. Small calcified granuloma is noted in the lower aspect of the right hepatic lobe. The gallbladder is normal. No common bile duct dilatation.  Pancreas: No mass, inflammation or ductal dilatation. Prominent fatty interstices.  Spleen: Normal size.  No focal lesions.  Adrenals/Urinary Tract: The adrenal glands are normal.  10 x 9 mm upper pole right renal calculus but no obstructing ureteral calculi. No left-sided renal or ureteral calculi. Simple appearing right renal cysts are noted. No worrisome renal lesions without  IV contrast. No bladder calculi or bladder lesions.  Stomach/Bowel: The stomach, duodenum, small bowel and colon are grossly normal without oral contrast. No inflammatory changes, mass lesions or obstructive findings. The terminal ileum and appendix are normal. Colonic diverticulosis without findings for acute diverticulitis.  Vascular/Lymphatic: Scattered atherosclerotic calcifications involving the abdominal aorta and iliac arteries but no aneurysm. Small scattered mesenteric and retroperitoneal lymph nodes but no mass or adenopathy.  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 The seminal vesicles are grossly normal.  Other: Evidence of prior lower abdominal wall hernia repair with mesh. No recurrent abdominal wall or inguinal hernias. No pelvic mass or adenopathy. No  inguinal adenopathy.  Musculoskeletal: No significant bony findings. 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  IMPRESSION: 1. 10 x 9 mm upper pole right renal calculus but no obstructing ureteral calculi or bladder calculi. 2. Right renal cysts but no worrisome renal lesions without contrast. 3. No acute abdominal/pelvic findings, mass lesions or adenopathy. 4. Colonic diverticulosis without findings for acute diverticulitis. 5. 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   Electronically Signed   By: Marijo Sanes M.D.   On: 10/11/2017 15:53 I have independently reviewed the films.    Assessment & Plan:    1. Right renal stone/right flank pain Discussed with the patient possible approaches to treating his right renal stone such as ESWL, PCNL and URS.   He has a friend who has undergone URS in the past and was quite pleased with results and therefore the patient is wanting to pursue URS at this time schedule right ureteroscopy with laser lithotripsy and ureteral stent placement explained to the patient how the procedure is performed and the risks involved informed patient that they will have a stent placed during the procedure and will remain in place after the procedure for a short time.  stent may be removed in the office with a cystoscope or patient may be instructed to remove the stent themselves by the string described "stent pain" as feelings of needing to urinate/overactive bladder and a warm, tingling sensation to intense pain in the affected flank residual stones within the kidney or ureter may be present after the procedure and may need to have these addressed at a different encounter injury to the ureter is the most common intra-operative risk, it may result in an open procedure to correct the defect infection and bleeding are also risks explained the risks of general anesthesia, such as:  MI, CVA, paralysis, coma and/or death. advised to contact our office or seek treatment in the ED if becomes febrile or pain/ vomiting are difficult control in order to arrange for emergent/urgent intervention obtain RUS to ensure the hydronephrosis has resolved once they have passed and/or recovered from procedure to ensure to iatrogenic hydronephrosis remains - it is explained to the patient that it is important to document resolution of the hydronephrosis as "silent hydronephrosis" can occur and cause damage and/or loss of the kidney  2. Microscopic hematuria  - UA today demonstrates 0-2 RBC's  - continue to monitor the patient's UA after the treatment/passage of the stone to ensure the hematuria has resolved  - if hematuria persists, we will pursue a hematuria workup with CT Urogram and cystoscopy if appropriate.    Return for right URS/LL/ureteral stent .  These notes generated with voice recognition software. I apologize for typographical errors.  Marton Malizia,  PA-C  [facility] [address]  [place], [place] [address] [phone]

## 2017-10-13 ENCOUNTER — Telehealth: Payer: Self-pay | Admitting: Radiology

## 2017-10-13 ENCOUNTER — Other Ambulatory Visit: Payer: Self-pay | Admitting: Radiology

## 2017-10-13 DIAGNOSIS — N200 Calculus of kidney: Secondary | ICD-10-CM

## 2017-10-13 NOTE — Telephone Encounter (Signed)
Dr Bernardo Heater, This patient will be having right ureteroscopy, laser lithotripsy, stent placement on 11/03/2017 with you. He saw Larene Beach on 10/11/2017.

## 2017-10-14 LAB — CULTURE, URINE COMPREHENSIVE

## 2017-10-17 ENCOUNTER — Other Ambulatory Visit: Payer: Self-pay | Admitting: Radiology

## 2017-10-17 LAB — CBC WITH DIFFERENTIAL

## 2017-10-17 LAB — BASIC METABOLIC PANEL
BUN/Creatinine Ratio: 17 (ref 10–24)
BUN: 22 mg/dL (ref 8–27)
CO2: 23 mmol/L (ref 20–29)
CREATININE: 1.29 mg/dL — AB (ref 0.76–1.27)
Calcium: 9.6 mg/dL (ref 8.6–10.2)
Chloride: 104 mmol/L (ref 96–106)
GFR calc Af Amer: 62 mL/min/{1.73_m2} (ref >59)
GFR calc non Af Amer: 54 mL/min/{1.73_m2} — ABNORMAL LOW (ref >59)
GLUCOSE: 105 mg/dL — AB (ref 65–99)
Potassium: 4.6 mmol/L (ref 3.5–5.2)
Sodium: 142 mmol/L (ref 134–144)

## 2017-10-19 ENCOUNTER — Other Ambulatory Visit: Payer: Self-pay | Admitting: Radiology

## 2017-10-24 DIAGNOSIS — Z01810 Encounter for preprocedural cardiovascular examination: Secondary | ICD-10-CM | POA: Insufficient documentation

## 2017-10-26 ENCOUNTER — Encounter
Admission: RE | Admit: 2017-10-26 | Discharge: 2017-10-26 | Disposition: A | Discharge status: Home / Self Care | Payer: Medicare Other | Source: Ambulatory Visit | Attending: Urology | Admitting: Urology

## 2017-10-26 ENCOUNTER — Other Ambulatory Visit: Payer: Self-pay

## 2017-10-26 DIAGNOSIS — Z79899 Other long term (current) drug therapy: Secondary | ICD-10-CM | POA: Diagnosis not present

## 2017-10-26 DIAGNOSIS — Z01812 Encounter for preprocedural laboratory examination: Secondary | ICD-10-CM | POA: Diagnosis present

## 2017-10-26 DIAGNOSIS — R3129 Other microscopic hematuria: Secondary | ICD-10-CM | POA: Insufficient documentation

## 2017-10-26 DIAGNOSIS — R109 Unspecified abdominal pain: Secondary | ICD-10-CM | POA: Insufficient documentation

## 2017-10-26 DIAGNOSIS — N200 Calculus of kidney: Secondary | ICD-10-CM | POA: Insufficient documentation

## 2017-10-26 DIAGNOSIS — Z7982 Long term (current) use of aspirin: Secondary | ICD-10-CM | POA: Diagnosis not present

## 2017-10-26 HISTORY — DX: Malignant (primary) neoplasm, unspecified: C80.1

## 2017-10-26 HISTORY — DX: Headache, unspecified: R51.9

## 2017-10-26 HISTORY — DX: Other complications of anesthesia, initial encounter: T88.59XA

## 2017-10-26 HISTORY — DX: Headache: R51

## 2017-10-26 HISTORY — DX: Anemia, unspecified: D64.9

## 2017-10-26 HISTORY — DX: Adverse effect of unspecified anesthetic, initial encounter: T41.45XA

## 2017-10-26 HISTORY — DX: Personal history of urinary calculi: Z87.442

## 2017-10-26 LAB — CBC
HEMATOCRIT: 40.2 % (ref 39.0–52.0)
Hemoglobin: 13.9 g/dL (ref 13.0–17.0)
MCH: 31.2 pg (ref 26.0–34.0)
MCHC: 34.6 g/dL (ref 30.0–36.0)
MCV: 90.1 fL (ref 80.0–100.0)
Platelets: 182 10*3/uL (ref 150–400)
RBC: 4.46 MIL/uL (ref 4.22–5.81)
RDW: 12.3 % (ref 11.5–15.5)
WBC: 7.8 10*3/uL (ref 4.0–10.5)
nRBC: 0 % (ref 0.0–0.2)

## 2017-10-26 LAB — URINALYSIS, COMPLETE (UACMP) WITH MICROSCOPIC
BACTERIA UA: NONE SEEN
BILIRUBIN URINE: NEGATIVE
Glucose, UA: NEGATIVE mg/dL
KETONES UR: NEGATIVE mg/dL
LEUKOCYTES UA: NEGATIVE
Nitrite: NEGATIVE
Protein, ur: NEGATIVE mg/dL
SPECIFIC GRAVITY, URINE: 1.017 (ref 1.005–1.030)
SQUAMOUS EPITHELIAL / LPF: NONE SEEN (ref 0–5)
pH: 5 (ref 5.0–8.0)

## 2017-10-26 LAB — BASIC METABOLIC PANEL
Anion gap: 4 — ABNORMAL LOW (ref 5–15)
BUN: 21 mg/dL (ref 8–23)
CO2: 26 mmol/L (ref 22–32)
CREATININE: 1.24 mg/dL (ref 0.61–1.24)
Calcium: 9.3 mg/dL (ref 8.9–10.3)
Chloride: 105 mmol/L (ref 98–111)
GFR calc non Af Amer: 55 mL/min — ABNORMAL LOW (ref >60)
Glucose, Bld: 105 mg/dL — ABNORMAL HIGH (ref 70–99)
Potassium: 4.1 mmol/L (ref 3.5–5.1)
Sodium: 135 mmol/L (ref 135–145)

## 2017-10-26 NOTE — Patient Instructions (Signed)
Your procedure is scheduled on: Friday, October 25th  Report to [facility]     DO NOT STOP ON THE FIRST FLOOR TO REGISTER  To find out your arrival time please call [phone] between 1PM - 3PM on Thursday, October 24th  Remember: Instructions that are not followed completely may result in serious medical risk,  up to and including death, or upon the discretion of your surgeon and anesthesiologist your  surgery may need to be rescheduled.     _X__ 1. Do not eat food after midnight the night before your procedure.                 No gum chewing or hard candies.                   You may drink clear liquids up to 2 hours before you are scheduled to arrive for your surgery-                   DO not drink clear liquids within 2 hours of the start of your surgery.                  Clear Liquids include:  water, apple juice without pulp, clear carbohydrate                 drink such as Clearfast of Gatorade, Black Coffee or Tea (Do not add                 anything to coffee or tea).  __X__2.  On the morning of surgery brush your teeth with toothpaste and water,                 You may rinse your mouth with mouthwash if you wish.                       Do not swallow any toothpaste of mouthwash.     _X__ 3.  No Alcohol for 24 hours before or after surgery.   _X__ 4.  Do Not Smoke or use e-cigarettes For 24 Hours Prior to Your Surgery.                 Do not use any chewable tobacco products for at least 6 hours prior to                 surgery.  ____  5.  Bring all medications with you on the day of surgery if instructed.   ____  6.  Notify your doctor if there is any change in your medical condition      (cold, fever, infections).     Do not wear jewelry, make-up, hairpins, clips or nail polish. Do not wear lotions, powders, or perfumes. You may wear deodorant. Do not shave 48 hours prior to surgery. Men may shave face and neck. Do  not bring valuables to the hospital.    [facility] is not responsible for any belongings or valuables.  Contacts, dentures or bridgework may not be worn into surgery. Leave your suitcase in the car. After surgery it may be brought to your room. For patients admitted to the hospital, discharge time is determined by your treatment team.   Patients discharged the day of surgery will not be allowed to drive home.   Please read over the following fact sheets that you were given:   PREPARING FOR SURGERY  ____ Take these medicines the morning of surgery with A SIP OF WATER:    1. NEURONTIN  2. ASTELIN NASAL SPRAY  3.   4.  5.  6.  ____ Fleet Enema (as directed)   _X___ Use ANTIBACTERIAL Soap as directed  ____ Use inhalers on the day of surgery  _X___ Stop ASPIRIN PRODUCTS TODAY.               THIS INCLUDES EXCEDRIN MIGRAINE / BC POWDER / GOODIES POWDER  __X__ Stop Anti-inflammatories AS OF TODAY                    THIS INCLUDES MOBIC / IBUPROFEN / MOTRIN / ADVIL / ALEVE          YOU MAY TAKE TYLENOL AT ANY TIME PRIOR TO SURGERY  __X__ Stop supplements until after surgery.                   STOP PREVAGEN / NEUGENIX UNTIL AFTER SURGERY     YOU MAY CONTINUE TAKING VITAMIN B12 AND MAGNESIUM BUT DO NOT TAKE EITHER ON THE DAY OF SURGERY  ____ Bring C-Pap to the hospital.   CONTINUE TAKING THE METOPROLOL AT NIGHT AS USUAL.  WEAR SOMETHING LOOSE AND COMFORTABLE TO THE HOSPITAL.

## 2017-10-27 LAB — URINE CULTURE: CULTURE: NO GROWTH

## 2017-11-02 MED ORDER — CEFAZOLIN SODIUM-DEXTROSE 2-4 GM/100ML-% IV SOLN
2.0000 g | INTRAVENOUS | Status: AC
  Administered 2017-11-03: 2 g via INTRAVENOUS

## 2017-11-03 ENCOUNTER — Ambulatory Visit
Admission: RE | Admit: 2017-11-03 | Discharge: 2017-11-03 | Disposition: A | Discharge status: Home / Self Care | Payer: Medicare Other | Source: Ambulatory Visit | Attending: Urology | Admitting: Urology

## 2017-11-03 ENCOUNTER — Encounter
Admission: RE | Disposition: A | Discharge status: Home / Self Care | Payer: Self-pay | Source: Ambulatory Visit | Attending: Urology

## 2017-11-03 ENCOUNTER — Emergency Department
Admission: EM | Admit: 2017-11-03 | Discharge: 2017-11-04 | Disposition: A | Discharge status: Home / Self Care | Payer: Medicare Other | Source: Home / Self Care | Attending: Emergency Medicine | Admitting: Emergency Medicine

## 2017-11-03 ENCOUNTER — Ambulatory Visit: Payer: Medicare Other | Admitting: Anesthesiology

## 2017-11-03 ENCOUNTER — Other Ambulatory Visit: Payer: Self-pay

## 2017-11-03 ENCOUNTER — Telehealth: Payer: Self-pay | Admitting: Urology

## 2017-11-03 ENCOUNTER — Emergency Department: Payer: Medicare Other

## 2017-11-03 DIAGNOSIS — N281 Cyst of kidney, acquired: Secondary | ICD-10-CM | POA: Diagnosis not present

## 2017-11-03 DIAGNOSIS — Z79899 Other long term (current) drug therapy: Secondary | ICD-10-CM | POA: Insufficient documentation

## 2017-11-03 DIAGNOSIS — N200 Calculus of kidney: Secondary | ICD-10-CM | POA: Diagnosis present

## 2017-11-03 DIAGNOSIS — G9009 Other idiopathic peripheral autonomic neuropathy: Secondary | ICD-10-CM | POA: Insufficient documentation

## 2017-11-03 DIAGNOSIS — G8918 Other acute postprocedural pain: Secondary | ICD-10-CM

## 2017-11-03 DIAGNOSIS — E039 Hypothyroidism, unspecified: Secondary | ICD-10-CM

## 2017-11-03 DIAGNOSIS — Z7982 Long term (current) use of aspirin: Secondary | ICD-10-CM | POA: Insufficient documentation

## 2017-11-03 DIAGNOSIS — N400 Enlarged prostate without lower urinary tract symptoms: Secondary | ICD-10-CM | POA: Diagnosis not present

## 2017-11-03 DIAGNOSIS — K5730 Diverticulosis of large intestine without perforation or abscess without bleeding: Secondary | ICD-10-CM | POA: Diagnosis not present

## 2017-11-03 DIAGNOSIS — I10 Essential (primary) hypertension: Secondary | ICD-10-CM

## 2017-11-03 HISTORY — PX: CYSTOSCOPY/RETROGRADE/URETEROSCOPY: SHX5316

## 2017-11-03 HISTORY — PX: CYSTOSCOPY WITH STENT PLACEMENT: SHX5790

## 2017-11-03 SURGERY — CYSTOSCOPY/RETROGRADE/URETEROSCOPY
Site: Ureter | Laterality: Right

## 2017-11-03 MED ORDER — SUGAMMADEX SODIUM 500 MG/5ML IV SOLN
INTRAVENOUS | Status: DC | PRN
  Administered 2017-11-03: 200 mg via INTRAVENOUS

## 2017-11-03 MED ORDER — OXYBUTYNIN CHLORIDE 5 MG PO TABS: ORAL_TABLET | ORAL | 1 refills | Status: DC

## 2017-11-03 MED ORDER — LACTATED RINGERS IV SOLN
INTRAVENOUS | Status: DC
  Administered 2017-11-03: 08:00:00 via INTRAVENOUS

## 2017-11-03 MED ORDER — NALOXEGOL OXALATE 25 MG PO TABS: 25.0000 mg | ORAL_TABLET | Freq: Every day | ORAL | 0 refills | Status: AC

## 2017-11-03 MED ORDER — FAMOTIDINE 20 MG PO TABS
ORAL_TABLET | ORAL | Status: AC
  Administered 2017-11-03: 20 mg via ORAL
  Filled 2017-11-03: qty 1

## 2017-11-03 MED ORDER — ONDANSETRON 4 MG PO TBDP
ORAL_TABLET | ORAL | Status: AC
  Administered 2017-11-03: 8 mg via ORAL
  Filled 2017-11-03: qty 2

## 2017-11-03 MED ORDER — PROPOFOL 10 MG/ML IV BOLUS
INTRAVENOUS | Status: DC | PRN
  Administered 2017-11-03: 130 mg via INTRAVENOUS

## 2017-11-03 MED ORDER — MIDAZOLAM HCL 2 MG/2ML IJ SOLN
INTRAMUSCULAR | Status: AC
  Filled 2017-11-03: qty 2

## 2017-11-03 MED ORDER — IOPAMIDOL (ISOVUE-200) INJECTION 41%
INTRAVENOUS | Status: DC | PRN
  Administered 2017-11-03: 15 mL

## 2017-11-03 MED ORDER — OXYCODONE-ACETAMINOPHEN 5-325 MG PO TABS: 1.0000 | ORAL_TABLET | Freq: Four times a day (QID) | ORAL | 0 refills | Status: DC | PRN

## 2017-11-03 MED ORDER — TRAMADOL HCL 50 MG PO TABS
ORAL_TABLET | ORAL | Status: AC
  Administered 2017-11-03: 50 mg via ORAL
  Filled 2017-11-03: qty 1

## 2017-11-03 MED ORDER — ROCURONIUM BROMIDE 100 MG/10ML IV SOLN
INTRAVENOUS | Status: DC | PRN
  Administered 2017-11-03: 30 mg via INTRAVENOUS

## 2017-11-03 MED ORDER — ONDANSETRON 4 MG PO TBDP
8.0000 mg | ORAL_TABLET | Freq: Once | ORAL | Status: AC
  Administered 2017-11-03: 8 mg via ORAL

## 2017-11-03 MED ORDER — DEXAMETHASONE SODIUM PHOSPHATE 10 MG/ML IJ SOLN
INTRAMUSCULAR | Status: DC | PRN
  Administered 2017-11-03: 5 mg via INTRAVENOUS

## 2017-11-03 MED ORDER — OXYBUTYNIN CHLORIDE 5 MG PO TABS
ORAL_TABLET | ORAL | Status: AC
  Administered 2017-11-03: 5 mg
  Filled 2017-11-03: qty 1

## 2017-11-03 MED ORDER — FENTANYL CITRATE (PF) 100 MCG/2ML IJ SOLN
INTRAMUSCULAR | Status: DC | PRN
  Administered 2017-11-03 (×2): 50 ug via INTRAVENOUS

## 2017-11-03 MED ORDER — FENTANYL CITRATE (PF) 100 MCG/2ML IJ SOLN
INTRAMUSCULAR | Status: AC
  Filled 2017-11-03: qty 2

## 2017-11-03 MED ORDER — OXYCODONE-ACETAMINOPHEN 5-325 MG PO TABS: 1.0000 | ORAL_TABLET | ORAL | 0 refills | Status: DC | PRN

## 2017-11-03 MED ORDER — HYDROMORPHONE HCL 1 MG/ML IJ SOLN
1.0000 mg | Freq: Once | INTRAMUSCULAR | Status: AC
  Administered 2017-11-03: 1 mg via SUBCUTANEOUS
  Filled 2017-11-03: qty 1

## 2017-11-03 MED ORDER — HYDROMORPHONE HCL 1 MG/ML IJ SOLN
INTRAMUSCULAR | Status: AC
  Filled 2017-11-03: qty 1

## 2017-11-03 MED ORDER — IBUPROFEN 600 MG PO TABS
600.0000 mg | ORAL_TABLET | Freq: Once | ORAL | Status: AC
  Administered 2017-11-03: 600 mg via ORAL
  Filled 2017-11-03: qty 1

## 2017-11-03 MED ORDER — FAMOTIDINE 20 MG PO TABS
20.0000 mg | ORAL_TABLET | Freq: Once | ORAL | Status: AC
  Administered 2017-11-03: 20 mg via ORAL

## 2017-11-03 MED ORDER — PROPOFOL 10 MG/ML IV BOLUS
INTRAVENOUS | Status: AC
  Filled 2017-11-03: qty 20

## 2017-11-03 MED ORDER — ONDANSETRON HCL 4 MG/2ML IJ SOLN
INTRAMUSCULAR | Status: AC
  Filled 2017-11-03: qty 2

## 2017-11-03 MED ORDER — OXYBUTYNIN CHLORIDE 5 MG PO TABS
5.0000 mg | ORAL_TABLET | Freq: Three times a day (TID) | ORAL | Status: DC | PRN
  Filled 2017-11-03: qty 1

## 2017-11-03 MED ORDER — TAMSULOSIN HCL 0.4 MG PO CAPS: 0.4000 mg | ORAL_CAPSULE | Freq: Every day | ORAL | 0 refills | Status: DC

## 2017-11-03 MED ORDER — MIDAZOLAM HCL 2 MG/2ML IJ SOLN
INTRAMUSCULAR | Status: DC | PRN
  Administered 2017-11-03 (×2): 1 mg via INTRAVENOUS

## 2017-11-03 MED ORDER — GABAPENTIN 300 MG PO CAPS
900.0000 mg | ORAL_CAPSULE | Freq: Once | ORAL | Status: AC
  Administered 2017-11-03: 900 mg via ORAL
  Filled 2017-11-03 (×2): qty 3

## 2017-11-03 MED ORDER — TRAMADOL HCL 50 MG PO TABS: 50.0000 mg | ORAL_TABLET | Freq: Four times a day (QID) | ORAL | 0 refills | Status: DC | PRN

## 2017-11-03 MED ORDER — ONDANSETRON HCL 4 MG/2ML IJ SOLN
INTRAMUSCULAR | Status: AC
  Administered 2017-11-03: 4 mg via INTRAVENOUS
  Filled 2017-11-03: qty 2

## 2017-11-03 MED ORDER — LIDOCAINE HCL (CARDIAC) PF 100 MG/5ML IV SOSY
PREFILLED_SYRINGE | INTRAVENOUS | Status: DC | PRN
  Administered 2017-11-03: 40 mg via INTRAVENOUS

## 2017-11-03 MED ORDER — ONDANSETRON HCL 4 MG/2ML IJ SOLN
INTRAMUSCULAR | Status: DC | PRN
  Administered 2017-11-03: 4 mg via INTRAVENOUS

## 2017-11-03 MED ORDER — TRAMADOL HCL 50 MG PO TABS
50.0000 mg | ORAL_TABLET | Freq: Four times a day (QID) | ORAL | Status: AC
  Administered 2017-11-03: 50 mg via ORAL
  Filled 2017-11-03: qty 1

## 2017-11-03 MED ORDER — ONDANSETRON HCL 4 MG/2ML IJ SOLN
4.0000 mg | Freq: Once | INTRAMUSCULAR | Status: AC
  Administered 2017-11-03: 4 mg via INTRAVENOUS

## 2017-11-03 MED ORDER — CEFAZOLIN SODIUM-DEXTROSE 2-4 GM/100ML-% IV SOLN
INTRAVENOUS | Status: AC
  Filled 2017-11-03: qty 100

## 2017-11-03 SURGICAL SUPPLY — 30 items
BAG DRAIN CYSTO-URO LG1000N (MISCELLANEOUS) ×4 IMPLANT
BASKET ZERO TIP 1.9FR (BASKET) IMPLANT
BRUSH SCRUB EZ 1% IODOPHOR (MISCELLANEOUS) ×4 IMPLANT
CATH URETL 5X70 OPEN END (CATHETERS) IMPLANT
CNTNR SPEC 2.5X3XGRAD LEK (MISCELLANEOUS)
CONRAY 43 FOR UROLOGY 50M (MISCELLANEOUS) ×4 IMPLANT
CONT SPEC 4OZ STER OR WHT (MISCELLANEOUS)
CONTAINER SPEC 2.5X3XGRAD LEK (MISCELLANEOUS) IMPLANT
DRAPE UTILITY 15X26 TOWEL STRL (DRAPES) ×4 IMPLANT
FIBER LASER LITHO 273 (Laser) IMPLANT
GLOVE BIO SURGEON STRL SZ8 (GLOVE) ×4 IMPLANT
GOWN STRL REUS W/ TWL LRG LVL3 (GOWN DISPOSABLE) ×2 IMPLANT
GOWN STRL REUS W/ TWL XL LVL3 (GOWN DISPOSABLE) ×2 IMPLANT
GOWN STRL REUS W/TWL LRG LVL3 (GOWN DISPOSABLE) ×2
GOWN STRL REUS W/TWL XL LVL3 (GOWN DISPOSABLE) ×2
INFUSOR MANOMETER BAG 3000ML (MISCELLANEOUS) ×4 IMPLANT
INTRODUCER DILATOR DOUBLE (INTRODUCER) ×3 IMPLANT
KIT TURNOVER CYSTO (KITS) ×4 IMPLANT
PACK CYSTO AR (MISCELLANEOUS) ×4 IMPLANT
SENSORWIRE 0.038 NOT ANGLED (WIRE) ×8
SET CYSTO W/LG BORE CLAMP LF (SET/KITS/TRAYS/PACK) ×4 IMPLANT
SHEATH URETERAL 12FR 45CM (SHEATH) ×3 IMPLANT
SHEATH URETERAL 12FRX35CM (MISCELLANEOUS) IMPLANT
SOL .9 NS 3000ML IRR  AL (IV SOLUTION) ×2
SOL .9 NS 3000ML IRR UROMATIC (IV SOLUTION) ×2 IMPLANT
STENT URET 6FRX24 CONTOUR (STENTS) IMPLANT
STENT URET 6FRX26 CONTOUR (STENTS) ×3 IMPLANT
SURGILUBE 2OZ TUBE FLIPTOP (MISCELLANEOUS) ×4 IMPLANT
WATER STERILE IRR 1000ML POUR (IV SOLUTION) ×4 IMPLANT
WIRE SENSOR 0.038 NOT ANGLED (WIRE) ×3 IMPLANT

## 2017-11-03 NOTE — Transfer of Care (Signed)
Immediate Anesthesia Transfer of Care Note  Patient: Kurt Hernandez  Procedure(s) Performed: CYSTOSCOPY/RETROGRADE/URETEROSCOPY (Right Ureter) CYSTOSCOPY WITH STENT PLACEMENT (Right Ureter)  Patient Location: PACU  Anesthesia Type:General  Level of Consciousness: sedated  Airway & Oxygen Therapy: Patient Spontanous Breathing and Patient connected to face mask oxygen  Post-op Assessment: Report given to RN and Post -op Vital signs reviewed and stable  Post vital signs: Reviewed and stable  Last Vitals:  Vitals Value Taken Time  BP 159/84 11/03/2017 10:03 AM  Temp    Pulse 50 11/03/2017 10:09 AM  Resp 18 11/03/2017 10:09 AM  SpO2 100 % 11/03/2017 10:09 AM  Vitals shown include unvalidated device data.  Last Pain:  Vitals:   11/03/17 0732  TempSrc: Tympanic  PainSc: 0-No pain         Complications: No apparent anesthesia complications

## 2017-11-03 NOTE — Telephone Encounter (Signed)
Informed Dr.Stoioff of this information, he states that he will call in a stronger medicine for patient

## 2017-11-03 NOTE — Anesthesia Procedure Notes (Signed)
Procedure Name: Intubation Date/Time: 11/03/2017 9:02 AM Performed by: Justus Memory, CRNA Pre-anesthesia Checklist: Patient identified, Patient being monitored, Timeout performed, Emergency Drugs available and Suction available Patient Re-evaluated:Patient Re-evaluated prior to induction Oxygen Delivery Method: Circle system utilized Preoxygenation: Pre-oxygenation with 100% oxygen Induction Type: IV induction Ventilation: Mask ventilation without difficulty Laryngoscope Size: Mac and 3 Grade View: Grade I Tube type: Oral Tube size: 7.0 mm Number of attempts: 1 Airway Equipment and Method: Stylet Placement Confirmation: ETT inserted through vocal cords under direct vision,  positive ETCO2 and breath sounds checked- equal and bilateral Secured at: 21 cm Tube secured with: Tape Dental Injury: Teeth and Oropharynx as per pre-operative assessment

## 2017-11-03 NOTE — Telephone Encounter (Signed)
Pt informed. Please send in pain meds

## 2017-11-03 NOTE — ED Triage Notes (Signed)
Patient presents to Emergency Department via EMS with complaints of right flank pain.  Pt had stent placed this am by Dr Bernardo Heater for "30mm kidney stone".     Pt took Toradol, Ditropan, and Flomax earlier today  Prefers non-narcotic interventions for pain owing to severe bout of constipation from last percoset   Pt took beta blocker approx 24 hours prior

## 2017-11-03 NOTE — ED Provider Notes (Signed)
[facility] Emergency Department Provider Note  ____________________________________________   First MD Initiated Contact with Patient 11/03/17 2301     (approximate)  I have reviewed the triage vital signs and the nursing notes.   HISTORY  Chief Complaint Flank Pain   HPI Kurt Hernandez is a 77 y.o. male comes to the emergency department by EMS with gradual onset slowly progressive now severe right flank pain associated with nausea and occasional vomiting.  Yesterday he had a ureteral stent placed for 14 mm right sided kidney stone.  He initially did well postoperatively however throughout the course of the day his symptoms have gradually progressed and are now severe.  He has a long-standing history of peripheral neuropathy and takes gabapentin daily.  He refuses to take any opioid pain medications because in the past if this made him constipated and he had to come to the hospital to be disimpacted.  Nothing at this time seems to make his symptoms better or worse.  He denies dysuria.  He denies fevers or chills.  He is quite frustrated with the situation and is "desperate for help".    Past Medical History:  Diagnosis Date  . Anemia    pernicious anemia  . Arrhythmia    patient unaware of diagnosis except heart skips beats  . Cancer (HCC)    lip cancer  . Complication of anesthesia    unable to void after surgery and had to stay overnight for several days w/ cath  . Headache   . History of kidney stones   . Hypertension     Patient Active Problem List   Diagnosis Date Noted  . Acquired hypothyroidism 11/21/2016  . PVC's (premature ventricular contractions) 08/24/2016  . Benign essential hypertension 11/19/2015  . 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 Lumbar radiculopathy 06/17/2013  . Peripheral neuropathy 05/13/2013  . Pernicious anemia 05/13/2013    Past Surgical History:  Procedure Laterality Date  . CYSTOSCOPY WITH  STENT PLACEMENT Right 11/03/2017   Procedure: CYSTOSCOPY WITH STENT PLACEMENT;  Surgeon: Abbie Sons, MD;  Location: [facility] ORS;  Service: Urology;  Laterality: Right;  . CYSTOSCOPY/RETROGRADE/URETEROSCOPY Right 11/03/2017   Procedure: CYSTOSCOPY/RETROGRADE/URETEROSCOPY;  Surgeon: Abbie Sons, MD;  Location: [facility] ORS;  Service: Urology;  Laterality: Right;  . EYE SURGERY Right    cataract extraction.  needed yag laser and still has problems   . HAND SURGERY Right 1989   tendons came undone from bones in wrist. metal removed  . HERNIA REPAIR Bilateral 2010   inguinal hernias  . HERNIA REPAIR      Prior to Admission medications   Medication Sig Start Date End Date Taking? Authorizing Provider  Apoaequorin (PREVAGEN) 10 MG CAPS Take 10 mg by mouth daily.    [provider]  aspirin 81 MG tablet Take 81 mg by mouth daily.    [provider]  aspirin-acetaminophen-caffeine (EXCEDRIN MIGRAINE) [phone] MG tablet Take 1 tablet by mouth daily as needed for headache.    [provider]  azelastine (ASTELIN) 0.1 % nasal spray Place 1 spray into both nostrils at bedtime. Use in each nostril as directed    [provider]  Cyanocobalamin (B-12) 5000 MCG CAPS Take 5,000 mcg by mouth daily.    [provider]  gabapentin (NEURONTIN) 300 MG capsule Take 300 mg by mouth See admin instructions. Take 300 mg by mouth 2 times daily, may take a 3rd 300 mg dose as needed for pain 08/03/16  [provider]  Magnesium 500 MG TABS Take 500 mg by mouth daily.    [provider]  meloxicam (MOBIC) 15 MG tablet Take 7.5 mg by mouth at bedtime.    [provider]  metoprolol succinate (TOPROL-XL) 25 MG 24 hr tablet Take 12.5 mg by mouth at bedtime.  07/20/16   [provider]  naloxegol oxalate (MOVANTIK) 25 MG TABS tablet Take 1 tablet (25 mg total) by mouth daily for 7 days. 11/03/17 11/10/17  Darel Hong, MD  ondansetron  (ZOFRAN ODT) 4 MG disintegrating tablet Take 1 tablet (4 mg total) by mouth every 8 (eight) hours as needed for nausea or vomiting. 11/04/17   Darel Hong, MD  OVER THE COUNTER MEDICATION Take 1 tablet by mouth daily. Nugenix otc supplement    [provider]  oxybutynin (DITROPAN) 5 MG tablet 1 tab tid prn frequency,urgency, bladder spasm 11/03/17   Stoioff, Ronda Fairly, MD  oxyCODONE-acetaminophen (PERCOCET/ROXICET) 5-325 MG tablet Take 1 tablet by mouth every 6 (six) hours as needed for severe pain. 11/03/17   Darel Hong, MD  tamsulosin (FLOMAX) 0.4 MG CAPS capsule Take 1 capsule (0.4 mg total) by mouth daily. 11/03/17   Stoioff, Ronda Fairly, MD    Allergies Patient has no known allergies.  Family History  Problem Relation Age of Onset  . Heart attack Father   . Stroke Mother     Social History Social History   Tobacco Use  . Smoking status: Never Smoker  . Smokeless tobacco: Never Used  Substance Use Topics  . Alcohol use: No    Comment: rare  . Drug use: No    Review of Systems Constitutional: No fever/chills Eyes: No visual changes. ENT: No sore throat. Cardiovascular: Denies chest pain. Respiratory: Denies shortness of breath. Gastrointestinal: Positive for abdominal pain.  Positive for nausea, positive for vomiting.  No diarrhea.  No constipation. Genitourinary: Negative for dysuria. Musculoskeletal: Positive for back pain. Skin: Negative for rash. Neurological: Negative for headaches, focal weakness or numbness.   ____________________________________________   PHYSICAL EXAM:  VITAL SIGNS: ED Triage Vitals  Enc Vitals Group     BP      Pulse      Resp      Temp      Temp src      SpO2      Weight      Height      Head Circumference      Peak Flow      Pain Score      Pain Loc      Pain Edu?      Excl. in [place]?     Constitutional: Alert and oriented x4 appears quite uncomfortable grimacing and holding his right side Eyes: PERRL  EOMI. Head: Atraumatic. Nose: No congestion/rhinnorhea. Mouth/Throat: No trismus Neck: No stridor.   Cardiovascular: Normal rate, regular rhythm. Grossly normal heart sounds.  Good peripheral circulation. Respiratory: Normal respiratory effort.  No retractions. Lungs CTAB and moving good air Gastrointestinal: Soft nondistended nontender no rebound or guarding no peritonitis very minimal right-sided costovertebral tenderness Musculoskeletal: No lower extremity edema   Neurologic:  Normal speech and language. No gross focal neurologic deficits are appreciated. Skin:  Skin is warm, dry and intact. No rash noted. Psychiatric: Mood and affect are normal. Speech and behavior are normal.    ____________________________________________   DIFFERENTIAL includes but not limited to  Postoperative pain, kidney stone, urinary tract infection, pyelonephritis ____________________________________________   LABS (all labs ordered  are listed, but only abnormal results are displayed)  Labs Reviewed - No data to display   __________________________________________  EKG   ____________________________________________  RADIOLOGY  KUB reviewed by me shows ureteral stent in appropriate position ____________________________________________   PROCEDURES  Procedure(s) performed: no  Procedures  Critical Care performed: no  ____________________________________________   INITIAL IMPRESSION / ASSESSMENT AND PLAN / ED COURSE  Pertinent labs & imaging results that were available during my care of the patient were reviewed by me and considered in my medical decision making (see chart for details).   As part of my medical decision making, I reviewed the following data within the [place] History obtained from family if available, nursing notes, old chart and ekg, as well as notes from prior ED visits.  Had a lengthy discussion with the patient, his wife, and son at bedside  regarding his postoperative pain.  I did recommend at least a single dose of opioid pain medication and he was given 1 mg of Dilaudid subcutaneously with significant improvement in his symptoms.  He persisted with nausea and I gave 8 mg of Zofran ODT which did help.  I also gave the patient 900 mg of Neurontin for pain and sedative effects in addition to 600 mg of ibuprofen.  I then had another conversation with the patient and family regarding pain control in the postoperative period.  We discussed that constipation is a side effect of both Zofran and opioid pain medications however it can be mitigated by taking over-the-counter Colace as well as a high-fiber diet and remaining well-hydrated.  I also recommended that he begin taking movantik which should help mitigate the effects of Percocet.  The patient is afebrile and nontoxic and his KUB shows the stent in appropriate position.  He is not peritoneal it.  I think this is normal postoperative pain that had not been appropriately treated as the patient was afraid.  I encouraged him to call Dr. Bernardo Heater in the morning to discuss future options.  He is discharged home with his wife driving in improved condition after a second dose of subcutaneous Dilaudid as it is quite late and no pharmacies are open.      ____________________________________________   FINAL CLINICAL IMPRESSION(S) / ED DIAGNOSES  Final diagnoses:  Acute post-operative pain      NEW MEDICATIONS STARTED DURING THIS VISIT:  Discharge Medication List as of 11/04/2017 12:54 AM    START taking these medications   Details  naloxegol oxalate (MOVANTIK) 25 MG TABS tablet Take 1 tablet (25 mg total) by mouth daily for 7 days., Starting Fri 11/03/2017, Until Fri 11/10/2017, Print    ondansetron (ZOFRAN ODT) 4 MG disintegrating tablet Take 1 tablet (4 mg total) by mouth every 8 (eight) hours as needed for nausea or vomiting., Starting Sat 11/04/2017, Print         Note:  This  document was prepared using Dragon voice recognition software and may include unintentional dictation errors.     Darel Hong, MD 11/04/17 [phone]

## 2017-11-03 NOTE — Telephone Encounter (Signed)
Pt's wife called and said Stoioff did surgery this morning and pt is in extreme pain, throwing up and they can't get the pain under control.  Please call pt [phone]

## 2017-11-03 NOTE — Progress Notes (Signed)
Pt spit up x1  Not measurable  Amt too small  But states feels better at present

## 2017-11-03 NOTE — Anesthesia Post-op Follow-up Note (Signed)
Anesthesia QCDR form completed.        

## 2017-11-03 NOTE — Telephone Encounter (Signed)
Rx sent 

## 2017-11-03 NOTE — Interval H&P Note (Signed)
History and Physical Interval Note: I discussed the procedure in detail with Kurt Hernandez.  He currently has a mild, achy right flank pain and a nonobstructing upper pole stone.  He was informed that his pain may not be related to the calculus and there is no guarantee it will resolve with stone removal.  The procedure was discussed in detail.  The low chance of inability to remove the stone secondary to inability to gain ureteral access was discussed.  In this event he would require stent placement and follow-up ureteroscopy versus shockwave lithotripsy.  11/03/2017 8:19 AM  Kurt Hernandez  has presented today for surgery, with the diagnosis of Right renal stone  The various methods of treatment have been discussed with the patient and family. After consideration of risks, benefits and other options for treatment, the patient has consented to  Procedure(s): CYSTOSCOPY/URETEROSCOPY/HOLMIUM LASER/STENT PLACEMENT (Right) as a surgical intervention .  The patient's history has been reviewed, patient examined, no change in status, stable for surgery.  I have reviewed the patient's chart and labs.  Questions were answered to the patient's satisfaction.     [place]

## 2017-11-03 NOTE — Anesthesia Preprocedure Evaluation (Signed)
Anesthesia Evaluation  Patient identified by MRN, date of birth, ID band Patient awake    Reviewed: Allergy & Precautions, H&P , NPO status , Patient's Chart, lab work & pertinent test results, reviewed documented beta blocker date and time   History of Anesthesia Complications (+) history of anesthetic complications  Airway Mallampati: II  TM Distance: >3 FB Neck ROM: full    Dental  (+) Teeth Intact   Pulmonary neg pulmonary ROS,    Pulmonary exam normal        Cardiovascular Exercise Tolerance: Good hypertension, On Medications negative cardio ROS Normal cardiovascular exam Rhythm:regular Rate:Normal     Neuro/Psych  Headaches,  Neuromuscular disease negative neurological ROS  negative psych ROS   GI/Hepatic negative GI ROS, Neg liver ROS,   Endo/Other  negative endocrine ROSHypothyroidism   Renal/GU negative Renal ROS  negative genitourinary   Musculoskeletal   Abdominal   Peds  Hematology negative hematology ROS (+) Blood dyscrasia, anemia ,   Anesthesia Other Findings Past Medical History: No date: Anemia     Comment:  pernicious anemia No date: Arrhythmia     Comment:  patient unaware of diagnosis except heart skips beats No date: Cancer ([place])     Comment:  lip cancer No date: Complication of anesthesia     Comment:  unable to void after surgery and had to stay overnight               for several days w/ cath No date: Headache No date: History of kidney stones No date: Hypertension Past Surgical History: No date: EYE SURGERY; Right     Comment:  cataract extraction.  needed yag laser and still has               problems  1989: HAND SURGERY; Right     Comment:  tendons came undone from bones in wrist. metal removed 2010: [place]; Bilateral     Comment:  inguinal hernias No date: HERNIA REPAIR   Reproductive/Obstetrics negative OB ROS                              Anesthesia Physical Anesthesia Plan  ASA: II  Anesthesia Plan: General ETT   Post-op Pain Management:    Induction:   PONV Risk Score and Plan:   Airway Management Planned:   Additional Equipment:   Intra-op Plan:   Post-operative Plan:   Informed Consent: I have reviewed the patients History and Physical, chart, labs and discussed the procedure including the risks, benefits and alternatives for the proposed anesthesia with the patient or authorized representative who has indicated his/her understanding and acceptance.   Dental Advisory Given  Plan Discussed with: CRNA  Anesthesia Plan Comments:         Anesthesia Quick Evaluation

## 2017-11-03 NOTE — Interval H&P Note (Signed)
History and Physical Interval Note: CV RRR, lungs clear  11/03/2017 8:43 AM  Kurt Hernandez  has presented today for surgery, with the diagnosis of Right renal stone  The various methods of treatment have been discussed with the patient and family. After consideration of risks, benefits and other options for treatment, the patient has consented to  Procedure(s): CYSTOSCOPY/URETEROSCOPY/HOLMIUM LASER/STENT PLACEMENT (Right) as a surgical intervention .  The patient's history has been reviewed, patient examined, no change in status, stable for surgery.  I have reviewed the patient's chart and labs.  Questions were answered to the patient's satisfaction.     [place]

## 2017-11-03 NOTE — Discharge Instructions (Addendum)
Please call Dr. Dene Gentry office tomorrow to discuss your postoperative pain.  Take your pain and nausea medication as needed for severe symptoms and return to the emergency department for any concerns.  It was a pleasure to take care of you today, and thank you for coming to our emergency department.  If you have any questions or concerns before leaving please ask the nurse to grab me and I'm more than happy to go through your aftercare instructions again.  If you were prescribed any opioid pain medication today such as Norco, Vicodin, Percocet, morphine, hydrocodone, or oxycodone please make sure you do not drive when you are taking this medication as it can alter your ability to drive safely.  If you have any concerns once you are home that you are not improving or are in fact getting worse before you can make it to your follow-up appointment, please do not hesitate to call 911 and come back for further evaluation.  Darel Hong, MD  Results for orders placed or performed during the hospital encounter of 10/26/17  Urine Culture  Result Value Ref Range   Specimen Description      URINE, CLEAN CATCH Performed at [facility], [address] [place], [place] [address]    Special Requests      NONE Performed at [facility], [address] [place], [place] [address]    Culture      NO GROWTH Performed at [facility], [place] [address] [place], [place] [address]    Report Status 10/27/2017 FINAL   CBC  Result Value Ref Range   WBC 7.8 4.0 - 10.5 K/uL   RBC 4.46 4.22 - 5.81 MIL/uL   Hemoglobin 13.9 13.0 - 17.0 g/dL   HCT 40.2 39.0 - 52.0 %   MCV 90.1 80.0 - 100.0 fL   MCH 31.2 26.0 - 34.0 pg   MCHC 34.6 30.0 - 36.0 g/dL   RDW 12.3 11.5 - 15.5 %   Platelets 182 150 - 400 K/uL   nRBC 0.0 0.0 - 0.2 %  Basic metabolic panel  Result Value Ref Range   Sodium 135 135 - 145 mmol/L   Potassium 4.1 3.5 - 5.1 mmol/L   Chloride 105 98 - 111 mmol/L   CO2 26 22  - 32 mmol/L   Glucose, Bld 105 (H) 70 - 99 mg/dL   BUN 21 8 - 23 mg/dL   Creatinine, Ser 1.24 0.61 - 1.24 mg/dL   Calcium 9.3 8.9 - 10.3 mg/dL   GFR calc non Af Amer 55 (L) >60 mL/min   GFR calc Af Amer >60 >60 mL/min   Anion gap 4 (L) 5 - 15  Urinalysis, Complete w Microscopic  Result Value Ref Range   Color, Urine YELLOW (A) YELLOW   APPearance CLEAR (A) CLEAR   Specific Gravity, Urine 1.017 1.005 - 1.030   pH 5.0 5.0 - 8.0   Glucose, UA NEGATIVE NEGATIVE mg/dL   Hgb urine dipstick SMALL (A) NEGATIVE   Bilirubin Urine NEGATIVE NEGATIVE   Ketones, ur NEGATIVE NEGATIVE mg/dL   Protein, ur NEGATIVE NEGATIVE mg/dL   Nitrite NEGATIVE NEGATIVE   Leukocytes, UA NEGATIVE NEGATIVE   RBC / HPF 0-5 0 - 5 RBC/hpf   WBC, UA 0-5 0 - 5 WBC/hpf   Bacteria, UA NONE SEEN NONE SEEN   Squamous Epithelial / LPF NONE SEEN 0 - 5   Mucus PRESENT    Dg Abdomen 1 View  Result Date: 11/03/2017  CLINICAL DATA:  Right flank pain after stent placement EXAM: ABDOMEN - 1 VIEW COMPARISON:  CT 10/11/2017 FINDINGS: Nonobstructed bowel-gas pattern with moderate stool in the colon. Right-sided ureteral stent. No stones along the course of the stent. 11 mm stone projecting over the upper right kidney. Previous hernia repair at the pelvis. IMPRESSION: Right-sided ureteral stent in place. 11 mm stone projecting over the upper aspect of right kidney. Electronically Signed   By: Donavan Foil M.D.   On: 11/03/2017 23:50   Ct Renal Stone Study  Result Date: 10/11/2017 CLINICAL DATA:  Right flank pain for 1 month. EXAM: CT ABDOMEN AND PELVIS WITHOUT CONTRAST TECHNIQUE: Multidetector CT imaging of the abdomen and pelvis was performed following the standard protocol without IV contrast. COMPARISON:  CT scan of 01/21/2011 FINDINGS: Lower chest: The lung bases are clear of an acute process. No worrisome pulmonary lesions. The heart is normal in size. No pericardial effusion. Hepatobiliary: Simple appearing cyst at the hepatic  dome. No worrisome hepatic lesions without IV contrast. Small calcified granuloma is noted in the lower aspect of the right hepatic lobe. The gallbladder is normal. No common bile duct dilatation. Pancreas: No mass, inflammation or ductal dilatation. Prominent fatty interstices. Spleen: Normal size.  No focal lesions. Adrenals/Urinary Tract: The adrenal glands are normal. 10 x 9 mm upper pole right renal calculus but no obstructing ureteral calculi. No left-sided renal or ureteral calculi. Simple appearing right renal cysts are noted. No worrisome renal lesions without IV contrast. No bladder calculi or bladder lesions. Stomach/Bowel: The stomach, duodenum, small bowel and colon are grossly normal without oral contrast. No inflammatory changes, mass lesions or obstructive findings. The terminal ileum and appendix are normal. Colonic diverticulosis without findings for acute diverticulitis. Vascular/Lymphatic: Scattered atherosclerotic calcifications involving the abdominal aorta and iliac arteries but no aneurysm. Small scattered mesenteric and retroperitoneal lymph nodes but no mass or adenopathy. 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 The seminal vesicles are grossly normal. Other: Evidence of prior lower abdominal wall hernia repair with mesh. No recurrent abdominal wall or inguinal hernias. No pelvic mass or adenopathy. No inguinal adenopathy. Musculoskeletal: No significant bony findings. 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 IMPRESSION: 1. 10 x 9 mm upper pole right renal calculus but no obstructing ureteral calculi or bladder calculi. 2. Right renal cysts but no worrisome renal lesions without contrast. 3. No acute abdominal/pelvic findings, mass lesions or adenopathy. 4. Colonic diverticulosis without findings for acute diverticulitis. 5. Mild prostate gland enlargement with median lobe hypertrophy  impressing on the base the bladder. Electronically Signed   By: Marijo Sanes M.D.   On: 10/11/2017 15:53

## 2017-11-03 NOTE — Op Note (Signed)
Preoperative diagnosis: Right nephrolithiasis   Postoperative diagnosis: Right nephrolithiasis  Procedure:  1. Cystoscopy 2. Right ureteroscopy  3. Right ureteral stent placement (6FR) 26 cm 4. Right retrograde pyelography with interpretation  Surgeon: Nicki Reaper C. Loribeth Katich, M.D.  Anesthesia: General  Complications: None  Intraoperative findings:  1.  Right retrograde pyelography demonstrated a filling defect within a right upper calyx consistent with the patient's known calculus.  No hydronephrosis was noted.  Minimal contrast extravasation.    EBL: Minimal  Specimens: 1. None   Indication: Kurt Hernandez is a 77 y.o. year old patient with a nonobstructing right upper pole renal calculus who desired ureteroscopic removal. After reviewing the management options for treatment, the patient elected to proceed with the above surgical procedure(s). We have discussed the potential benefits and risks of the procedure, side effects of the proposed treatment, the likelihood of the patient achieving the goals of the procedure, and any potential problems that might occur during the procedure or recuperation. Informed consent has been obtained.  Description of procedure:  The patient was taken to the operating room and general anesthesia was induced.  The patient was placed in the dorsal lithotomy position, prepped and draped in the usual sterile fashion, and preoperative antibiotics were administered. A preoperative time-out was performed.   A 22 French cystoscope was lubricated and passed under direct vision.  The urethra was normal in caliber without stricture.  The prostate demonstrated mild lateral lobe enlargement.  Panendoscopy was performed and the bladder mucosa showed no erythema, solid or papillary lesions.  Attention was directed to the right ureteral orifice and a 0.038 Sensor wire was then advanced up the ureter into the renal pelvis under fluoroscopic guidance.  The cystoscope was  removed and a dual-lumen catheter was placed over the sensor wire and a second sensor wire was placed in a similar fashion.  A 12/14 French ureteral access sheath was placed over the working wire under fluoroscopic guidance; mild resistance was noted in the proximal ureter and the sheath was not forced.  A digital flexible ureteroscope was placed through the access sheath and up the ureter.  A small perforation was noted in the proximal ureter.  The ureteroscope was unable to be advanced beyond a narrowed area in the proximal ureter even over a second guidewire.  The flexible ureteroscope and sheath were removed in tandem and the remainder of the ureter was normal in appearance.  A 6 French open-ended ureteral catheter was placed over the guidewire and retrograde pyelogram was performed to confirm correct location with findings as described above.  The guidewire was replaced and backloaded on the cystoscope.  A 6 French/26 cm double-J ureteral stent was placed through the cystoscope.  The proximal tip was located in an upper pole calyx.  The distal lumen stent was well-positioned in the bladder.  The bladder was then emptied and the procedure ended.  The patient appeared to tolerate the procedure well and without complications.  After anesthetic reversal the patient was transported to the PACU in stable condition.   Plan: We will leave the stent indwelling for a period of passive dilation of the ureter and will schedule follow-up ureteroscopy.  The alternative of shockwave lithotripsy will also be discussed with the patient.   John Giovanni, MD

## 2017-11-03 NOTE — Progress Notes (Signed)
Voided 100cc blood tinged urine

## 2017-11-04 DIAGNOSIS — N200 Calculus of kidney: Secondary | ICD-10-CM | POA: Diagnosis not present

## 2017-11-04 MED ORDER — HYDROMORPHONE HCL 1 MG/ML IJ SOLN
1.0000 mg | Freq: Once | INTRAMUSCULAR | Status: AC
  Administered 2017-11-04: 1 mg via SUBCUTANEOUS
  Filled 2017-11-04: qty 1

## 2017-11-04 MED ORDER — ONDANSETRON 4 MG PO TBDP
4.0000 mg | ORAL_TABLET | Freq: Once | ORAL | Status: AC
  Administered 2017-11-04: 4 mg via ORAL
  Filled 2017-11-04: qty 1

## 2017-11-04 MED ORDER — ONDANSETRON 4 MG PO TBDP: 4.0000 mg | ORAL_TABLET | Freq: Three times a day (TID) | ORAL | 0 refills | Status: DC | PRN

## 2017-11-04 NOTE — ED Notes (Signed)
ED Provider at bedside. 

## 2017-11-04 NOTE — ED Notes (Signed)
No peripheral IV placed this visit.   Discharge instructions reviewed with patient. Questions fielded by this RN. Patient verbalizes understanding of instructions. Patient discharged home in stable condition per rifenbark. No acute distress noted at time of discharge.   

## 2017-11-04 NOTE — ED Notes (Signed)
Pt given PB, crackers, applesauce, and cranberry

## 2017-11-06 ENCOUNTER — Telehealth: Payer: Self-pay | Admitting: Urology

## 2017-11-06 NOTE — Telephone Encounter (Signed)
Pt wife was advised by ER to call Dr. Bernardo Heater to let him know the pt was transported to ER Friday evening, Pt wife states pt is doing somewhat better now, still in some pain but only taking tramadol due to pt not wanting to take the stronger pain meds. Wife also wants to know when will the next surgery be. Advised wife that the Surgical coordinator should be in touch once Dr. Bernardo Heater gives her the details.  FYI

## 2017-11-06 NOTE — Telephone Encounter (Signed)
Will need follow-up ureteroscopy in 2 to 3 weeks

## 2017-11-08 ENCOUNTER — Other Ambulatory Visit: Payer: Medicare Other

## 2017-11-08 ENCOUNTER — Other Ambulatory Visit: Payer: Self-pay | Admitting: Radiology

## 2017-11-08 DIAGNOSIS — N200 Calculus of kidney: Secondary | ICD-10-CM

## 2017-11-08 NOTE — Telephone Encounter (Signed)
Surgery has been scheduled on 11/14/2017. Appointment made to return to clinic for urine culture prior to surgery.

## 2017-11-11 LAB — CULTURE, URINE COMPREHENSIVE

## 2017-11-13 MED ORDER — CEFAZOLIN SODIUM-DEXTROSE 2-4 GM/100ML-% IV SOLN
2.0000 g | INTRAVENOUS | Status: AC
  Administered 2017-11-14: 2 g via INTRAVENOUS

## 2017-11-13 NOTE — Anesthesia Postprocedure Evaluation (Signed)
Anesthesia Post Note  Patient: Marquette Blodgett Singleton  Procedure(s) Performed: CYSTOSCOPY/RETROGRADE/URETEROSCOPY (Right Ureter) CYSTOSCOPY WITH STENT PLACEMENT (Right Ureter)  Patient location during evaluation: PACU Anesthesia Type: General Level of consciousness: awake and alert Pain management: pain level controlled Vital Signs Assessment: post-procedure vital signs reviewed and stable Respiratory status: spontaneous breathing, nonlabored ventilation, respiratory function stable and patient connected to nasal cannula oxygen Cardiovascular status: blood pressure returned to baseline and stable Postop Assessment: no apparent nausea or vomiting Anesthetic complications: no     Last Vitals:  Vitals:   11/03/17 1103 11/03/17 1126  BP: (!) 181/89 (!) 154/99  Pulse: 65 (!) 51  Resp: 16   Temp: (!) 35.8 C   SpO2: 97% 100%    Last Pain:  Vitals:   11/06/17 0825  TempSrc:   PainSc: Vicco Adams

## 2017-11-14 ENCOUNTER — Encounter
Admission: RE | Disposition: A | Discharge status: Home / Self Care | Payer: Self-pay | Source: Ambulatory Visit | Attending: Urology

## 2017-11-14 ENCOUNTER — Ambulatory Visit
Admission: RE | Admit: 2017-11-14 | Discharge: 2017-11-14 | Disposition: A | Discharge status: Home / Self Care | Payer: Medicare Other | Source: Ambulatory Visit | Attending: Urology | Admitting: Urology

## 2017-11-14 ENCOUNTER — Ambulatory Visit: Payer: Medicare Other | Admitting: Anesthesiology

## 2017-11-14 DIAGNOSIS — I10 Essential (primary) hypertension: Secondary | ICD-10-CM | POA: Insufficient documentation

## 2017-11-14 DIAGNOSIS — N200 Calculus of kidney: Secondary | ICD-10-CM | POA: Insufficient documentation

## 2017-11-14 DIAGNOSIS — Z85819 Personal history of malignant neoplasm of unspecified site of lip, oral cavity, and pharynx: Secondary | ICD-10-CM | POA: Diagnosis not present

## 2017-11-14 HISTORY — PX: CYSTOSCOPY/URETEROSCOPY/HOLMIUM LASER/STENT PLACEMENT: SHX6546

## 2017-11-14 HISTORY — PX: CYSTOSCOPY W/ RETROGRADES: SHX1426

## 2017-11-14 SURGERY — CYSTOSCOPY/URETEROSCOPY/HOLMIUM LASER/STENT PLACEMENT
Anesthesia: General | Site: Ureter | Laterality: Right

## 2017-11-14 MED ORDER — LIDOCAINE HCL (CARDIAC) PF 100 MG/5ML IV SOSY
PREFILLED_SYRINGE | INTRAVENOUS | Status: DC | PRN
  Administered 2017-11-14: 80 mg via INTRAVENOUS

## 2017-11-14 MED ORDER — MEPERIDINE HCL 50 MG/ML IJ SOLN: 6.2500 mg | INTRAMUSCULAR | Status: DC | PRN

## 2017-11-14 MED ORDER — FENTANYL CITRATE (PF) 100 MCG/2ML IJ SOLN
INTRAMUSCULAR | Status: DC | PRN
  Administered 2017-11-14 (×2): 50 ug via INTRAVENOUS

## 2017-11-14 MED ORDER — ROCURONIUM BROMIDE 100 MG/10ML IV SOLN
INTRAVENOUS | Status: DC | PRN
  Administered 2017-11-14: 50 mg via INTRAVENOUS

## 2017-11-14 MED ORDER — ONDANSETRON HCL 4 MG/2ML IJ SOLN
INTRAMUSCULAR | Status: DC | PRN
  Administered 2017-11-14: 4 mg via INTRAVENOUS

## 2017-11-14 MED ORDER — MIDAZOLAM HCL 2 MG/2ML IJ SOLN
INTRAMUSCULAR | Status: DC | PRN
  Administered 2017-11-14: 2 mg via INTRAVENOUS

## 2017-11-14 MED ORDER — OXYCODONE HCL 5 MG/5ML PO SOLN: 5.0000 mg | Freq: Once | ORAL | Status: DC | PRN

## 2017-11-14 MED ORDER — ONDANSETRON HCL 4 MG/2ML IJ SOLN
INTRAMUSCULAR | Status: AC
  Filled 2017-11-14: qty 2

## 2017-11-14 MED ORDER — IOPAMIDOL (ISOVUE-200) INJECTION 41%
INTRAVENOUS | Status: DC | PRN
  Administered 2017-11-14: 15 mL

## 2017-11-14 MED ORDER — PROPOFOL 10 MG/ML IV BOLUS
INTRAVENOUS | Status: DC | PRN
  Administered 2017-11-14: 160 mg via INTRAVENOUS

## 2017-11-14 MED ORDER — HYDROMORPHONE HCL 1 MG/ML IJ SOLN
INTRAMUSCULAR | Status: AC
  Filled 2017-11-14: qty 1

## 2017-11-14 MED ORDER — EPHEDRINE SULFATE 50 MG/ML IJ SOLN
INTRAMUSCULAR | Status: DC | PRN
  Administered 2017-11-14: 10 mg via INTRAVENOUS

## 2017-11-14 MED ORDER — PROMETHAZINE HCL 25 MG/ML IJ SOLN
6.2500 mg | INTRAMUSCULAR | Status: DC | PRN
  Administered 2017-11-14: 6.25 mg via INTRAVENOUS

## 2017-11-14 MED ORDER — PROMETHAZINE HCL 25 MG/ML IJ SOLN
INTRAMUSCULAR | Status: AC
  Filled 2017-11-14: qty 1

## 2017-11-14 MED ORDER — MIDAZOLAM HCL 2 MG/2ML IJ SOLN
INTRAMUSCULAR | Status: AC
  Filled 2017-11-14: qty 2

## 2017-11-14 MED ORDER — HYDROMORPHONE HCL 1 MG/ML IJ SOLN
INTRAMUSCULAR | Status: DC | PRN
  Administered 2017-11-14 (×2): .3 mg via INTRAVENOUS

## 2017-11-14 MED ORDER — OXYCODONE HCL 5 MG PO TABS: 5.0000 mg | ORAL_TABLET | Freq: Once | ORAL | Status: DC | PRN

## 2017-11-14 MED ORDER — CEFAZOLIN SODIUM-DEXTROSE 2-4 GM/100ML-% IV SOLN
INTRAVENOUS | Status: AC
  Filled 2017-11-14: qty 100

## 2017-11-14 MED ORDER — FENTANYL CITRATE (PF) 100 MCG/2ML IJ SOLN
INTRAMUSCULAR | Status: AC
  Filled 2017-11-14: qty 2

## 2017-11-14 MED ORDER — PROPOFOL 10 MG/ML IV BOLUS
INTRAVENOUS | Status: AC
  Filled 2017-11-14: qty 20

## 2017-11-14 MED ORDER — SODIUM CHLORIDE FLUSH 0.9 % IV SOLN
INTRAVENOUS | Status: AC
  Filled 2017-11-14: qty 10

## 2017-11-14 MED ORDER — LACTATED RINGERS IV SOLN
INTRAVENOUS | Status: DC | PRN
  Administered 2017-11-14: 07:00:00 via INTRAVENOUS

## 2017-11-14 MED ORDER — LACTATED RINGERS IV SOLN
Freq: Once | INTRAVENOUS | Status: AC
  Administered 2017-11-14: 07:00:00 via INTRAVENOUS

## 2017-11-14 MED ORDER — FENTANYL CITRATE (PF) 100 MCG/2ML IJ SOLN: 25.0000 ug | INTRAMUSCULAR | Status: DC | PRN

## 2017-11-14 MED ORDER — DEXAMETHASONE SODIUM PHOSPHATE 10 MG/ML IJ SOLN
INTRAMUSCULAR | Status: DC | PRN
  Administered 2017-11-14: 10 mg via INTRAVENOUS

## 2017-11-14 SURGICAL SUPPLY — 30 items
BAG DRAIN CYSTO-URO LG1000N (MISCELLANEOUS) ×4 IMPLANT
BASKET ZERO TIP 1.9FR (BASKET) ×4 IMPLANT
BRUSH SCRUB EZ 1% IODOPHOR (MISCELLANEOUS) ×4 IMPLANT
CATH URETL 5X70 OPEN END (CATHETERS) IMPLANT
CNTNR SPEC 2.5X3XGRAD LEK (MISCELLANEOUS) ×2
CONRAY 43 FOR UROLOGY 50M (MISCELLANEOUS) ×4 IMPLANT
CONT SPEC 4OZ STER OR WHT (MISCELLANEOUS) ×2
CONTAINER SPEC 2.5X3XGRAD LEK (MISCELLANEOUS) ×2 IMPLANT
DRAPE UTILITY 15X26 TOWEL STRL (DRAPES) ×4 IMPLANT
FIBER LASER LITHO 273 (Laser) ×4 IMPLANT
GLOVE BIO SURGEON STRL SZ8 (GLOVE) ×4 IMPLANT
GOWN STRL REUS W/ TWL LRG LVL3 (GOWN DISPOSABLE) ×2 IMPLANT
GOWN STRL REUS W/ TWL XL LVL3 (GOWN DISPOSABLE) ×2 IMPLANT
GOWN STRL REUS W/TWL LRG LVL3 (GOWN DISPOSABLE) ×2
GOWN STRL REUS W/TWL XL LVL3 (GOWN DISPOSABLE) ×2
INFUSOR MANOMETER BAG 3000ML (MISCELLANEOUS) ×4 IMPLANT
INTRODUCER DILATOR DOUBLE (INTRODUCER) IMPLANT
KIT TURNOVER CYSTO (KITS) ×4 IMPLANT
PACK CYSTO AR (MISCELLANEOUS) ×4 IMPLANT
SENSORWIRE 0.038 NOT ANGLED (WIRE) ×8
SET CYSTO W/LG BORE CLAMP LF (SET/KITS/TRAYS/PACK) ×4 IMPLANT
SHEATH URETERAL 12FRX35CM (MISCELLANEOUS) IMPLANT
SOL .9 NS 3000ML IRR  AL (IV SOLUTION) ×2
SOL .9 NS 3000ML IRR UROMATIC (IV SOLUTION) ×2 IMPLANT
STENT PERCUFLEX 4.8FRX26 (STENTS) ×4 IMPLANT
STENT URET 6FRX24 CONTOUR (STENTS) IMPLANT
STENT URET 6FRX26 CONTOUR (STENTS) IMPLANT
SURGILUBE 2OZ TUBE FLIPTOP (MISCELLANEOUS) ×4 IMPLANT
WATER STERILE IRR 1000ML POUR (IV SOLUTION) ×4 IMPLANT
WIRE SENSOR 0.038 NOT ANGLED (WIRE) ×4 IMPLANT

## 2017-11-14 NOTE — Anesthesia Post-op Follow-up Note (Signed)
Anesthesia QCDR form completed.        

## 2017-11-14 NOTE — Anesthesia Procedure Notes (Signed)
Procedure Name: Intubation Date/Time: 11/14/2017 7:34 AM Performed by: Justus Memory, CRNA Pre-anesthesia Checklist: Patient identified, Patient being monitored, Timeout performed, Emergency Drugs available and Suction available Patient Re-evaluated:Patient Re-evaluated prior to induction Oxygen Delivery Method: Circle system utilized Preoxygenation: Pre-oxygenation with 100% oxygen Induction Type: IV induction Ventilation: Mask ventilation without difficulty Laryngoscope Size: Mac and 3 Grade View: Grade I Tube type: Oral Tube size: 7.0 mm Number of attempts: 1 Airway Equipment and Method: Stylet Placement Confirmation: ETT inserted through vocal cords under direct vision,  positive ETCO2 and breath sounds checked- equal and bilateral Secured at: 21 cm Tube secured with: Tape Dental Injury: Teeth and Oropharynx as per pre-operative assessment

## 2017-11-14 NOTE — Anesthesia Postprocedure Evaluation (Signed)
Anesthesia Post Note  Patient: Kurt Hernandez  Procedure(s) Performed: CYSTOSCOPY/URETEROSCOPY/HOLMIUM LASER/STENT Exchange (Right ) CYSTOSCOPY WITH RETROGRADE PYELOGRAM (Right Ureter)  Patient location during evaluation: PACU Anesthesia Type: General Level of consciousness: awake and alert and oriented Pain management: pain level controlled Vital Signs Assessment: post-procedure vital signs reviewed and stable Respiratory status: spontaneous breathing, nonlabored ventilation and respiratory function stable Cardiovascular status: blood pressure returned to baseline and stable Postop Assessment: no signs of nausea or vomiting Anesthetic complications: no     Last Vitals:  Vitals:   11/14/17 1023 11/14/17 1037  BP: 138/72 137/67  Pulse: 60 (!) 55  Resp:  18  Temp:  (!) 36.3 C  SpO2: 95% 100%    Last Pain:  Vitals:   11/14/17 1037  TempSrc: Tympanic  PainSc: 0-No pain                 Romelo Sciandra

## 2017-11-14 NOTE — Progress Notes (Signed)
   11/14/17 0700  Clinical Encounter Type  Visited With Patient;Family (Wife, Zane)  Visit Type Initial;Spiritual support  Recommendations Follow-up as requested.  Spiritual Encounters  Spiritual Needs Emotional;Prayer   Chaplain encountered the patient and wife and offered pastoral presence, active listening, and prayer.

## 2017-11-14 NOTE — Anesthesia Preprocedure Evaluation (Signed)
Anesthesia Evaluation  Patient identified by MRN, date of birth, ID band Patient awake    Reviewed: Allergy & Precautions, NPO status , Patient's Chart, lab work & pertinent test results  History of Anesthesia Complications Negative for: history of anesthetic complications  Airway Mallampati: II  TM Distance: >3 FB Neck ROM: Full    Dental  (+) Missing, Partial Upper   Pulmonary neg pulmonary ROS, neg sleep apnea, neg COPD,    breath sounds clear to auscultation- rhonchi (-) wheezing      Cardiovascular hypertension, Pt. on medications (-) CAD, (-) Past MI, (-) Cardiac Stents and (-) CABG  Rhythm:Regular Rate:Normal - Systolic murmurs and - Diastolic murmurs    Neuro/Psych  Headaches, negative psych ROS   GI/Hepatic negative GI ROS, Neg liver ROS,   Endo/Other  neg diabetesHypothyroidism   Renal/GU Renal disease: nephrolithiasis.     Musculoskeletal negative musculoskeletal ROS (+)   Abdominal (+) - obese,   Peds  Hematology  (+) anemia ,   Anesthesia Other Findings Past Medical History: No date: Anemia     Comment:  pernicious anemia No date: Arrhythmia     Comment:  patient unaware of diagnosis except heart skips beats No date: Cancer ([facility])     Comment:  lip cancer No date: Complication of anesthesia     Comment:  unable to void after surgery and had to stay overnight               for several days w/ cath No date: Headache No date: History of kidney stones No date: Hypertension   Reproductive/Obstetrics                             Anesthesia Physical Anesthesia Plan  ASA: II  Anesthesia Plan: General   Post-op Pain Management:    Induction: Intravenous  PONV Risk Score and Plan: 1 and Ondansetron and Midazolam  Airway Management Planned: Oral ETT  Additional Equipment:   Intra-op Plan:   Post-operative Plan: Extubation in OR  Informed Consent: I have reviewed  the patients History and Physical, chart, labs and discussed the procedure including the risks, benefits and alternatives for the proposed anesthesia with the patient or authorized representative who has indicated his/her understanding and acceptance.   Dental advisory given  Plan Discussed with: CRNA and Anesthesiologist  Anesthesia Plan Comments:         Anesthesia Quick Evaluation

## 2017-11-14 NOTE — H&P (Signed)
   11/14/2017 7:19 AM   Kurt Hernandez 12-Sep-1940 [id number]  Referring provider: No referring provider defined for this encounter.  HPI: 77 year old male with a 10 mm right upper pole nonobstructing renal cactus.  He was having dull right flank pain requested stone removal.  He underwent right ureteroscopy on 10/25 however the upper tract could not be accessed and he required stent placement.  He is having fairly significant stent irritative symptoms.  He presents today for follow-up ureteroscopy.   PMH: Past Medical History:  Diagnosis Date  . Anemia    pernicious anemia  . Arrhythmia    patient unaware of diagnosis except heart skips beats  . Cancer ([facility])    lip cancer  . Complication of anesthesia    unable to void after surgery and had to stay overnight for several days w/ cath  . Headache   . History of kidney stones   . Hypertension     Surgical History: Past Surgical History:  Procedure Laterality Date  . CYSTOSCOPY WITH STENT PLACEMENT Right 11/03/2017   Procedure: CYSTOSCOPY WITH STENT PLACEMENT;  Surgeon: Abbie Sons, MD;  Location: [facility] ORS;  Service: Urology;  Laterality: Right;  . CYSTOSCOPY/RETROGRADE/URETEROSCOPY Right 11/03/2017   Procedure: CYSTOSCOPY/RETROGRADE/URETEROSCOPY;  Surgeon: Abbie Sons, MD;  Location: [facility] ORS;  Service: Urology;  Laterality: Right;  . EYE SURGERY Right    cataract extraction.  needed yag laser and still has problems   . HAND SURGERY Right 1989   tendons came undone from bones in wrist. metal removed  . HERNIA REPAIR Bilateral 2010   inguinal hernias  . HERNIA REPAIR      Home Medications:  Medications reviewed  Allergies: No Known Allergies  Family History: Family History  Problem Relation Age of Onset  . Heart attack Father   . Stroke Mother     Social History:  reports that he has never smoked. He has never used smokeless tobacco. He reports that he does not drink alcohol or use drugs.  ROS:  Noncontributory except as per the HPI  Physical Exam: BP 125/76   Pulse (!) 57   Temp 97.8 F (36.6 C) (Tympanic)   Resp 17   SpO2 97%   Constitutional:  Alert and oriented, No acute distress. HEENT: [place] AT, moist mucus membranes.  Trachea midline, no masses. Cardiovascular: No clubbing, cyanosis, or edema.  RRR Respiratory: Normal respiratory effort, no increased work of breathing.  Clear GI: Abdomen is soft, nontender, nondistended, no abdominal masses GU: No CVA tenderness Lymph: No cervical or inguinal lymphadenopathy. Skin: No rashes, bruises or suspicious lesions. Neurologic: Grossly intact, no focal deficits, moving all 4 extremities. Psychiatric: Normal mood and affect.   Assessment & Plan:   Patient presents for follow-up right ureteroscopy with laser lithotripsy stone removal.  The procedure was again discussed in detail and the possibility that the upper tract may still not be accessed.  In this event he may need shockwave lithotripsy.  Potential risks were discussed including bleeding, infection and ureteral injury.  He indicated all questions were answered and desires to proceed.   Abbie Sons, [place] [address], [place], [place] [address] [phone]

## 2017-11-14 NOTE — Transfer of Care (Addendum)
Immediate Anesthesia Transfer of Care Note  Patient: Kurt Hernandez  Procedure(s) Performed: CYSTOSCOPY/URETEROSCOPY/HOLMIUM LASER/STENT Exchange (Right ) CYSTOSCOPY WITH RETROGRADE PYELOGRAM (Right Ureter)  Patient Location: PACU  Anesthesia Type:General  Level of Consciousness: sedated  Airway & Oxygen Therapy: Patient Spontanous Breathing and Patient connected to face mask oxygen  Post-op Assessment: Report given to RN and Post -op Vital signs reviewed and stable  Post vital signs: Reviewed and stable  Last Vitals:  Vitals Value Taken Time  BP 150/71 11/14/2017  9:52 AM  Temp 36.3 C 11/14/2017  9:52 AM  Pulse 66 11/14/2017  9:55 AM  Resp    SpO2 100 % 11/14/2017  9:55 AM  Vitals shown include unvalidated device data.  Last Pain:  Vitals:   11/14/17 0952  TempSrc:   PainSc: Asleep         Complications: No apparent anesthesia complications

## 2017-11-14 NOTE — Interval H&P Note (Signed)
History and Physical Interval Note:  11/14/2017 7:22 AM  Kurt Hernandez  has presented today for surgery, with the diagnosis of right renal stone  The various methods of treatment have been discussed with the patient and family. After consideration of risks, benefits and other options for treatment, the patient has consented to  Procedure(s) with comments: CYSTOSCOPY/URETEROSCOPY/HOLMIUM LASER/STENT Exchange (Right) - stent exchange as a surgical intervention .  The patient's history has been reviewed, patient examined, no change in status, stable for surgery.  I have reviewed the patient's chart and labs.  Questions were answered to the patient's satisfaction.     [place]

## 2017-11-14 NOTE — Op Note (Signed)
Preoperative diagnosis: Right nephrolithiasis   Postoperative diagnosis: Right nephrolithiasis  Procedure:  1. Cystoscopy 2. Right ureteroscopy and stone removal 3. Ureteroscopic laser lithotripsy 4. Right ureteral stent placement (4.8 FR) 26 cm 5. Right retrograde pyelography with interpretation  Surgeon: Nicki Reaper C. Shaneice Barsanti, M.D.  Anesthesia: General  Complications: None  Intraoperative findings:  1. Right retrograde pyelography post procedure showed no filling defects, stone fragments or contrast extravasation  EBL: Minimal  Specimens: 1. Calculus fragments for analysis   Indication: Kurt Hernandez is a 77 y.o. year old patient with a 10 mm nonobstructing right upper pole calculus who desired endoscopic removal.  He underwent ureteroscopy on 11/03/2017 however access to the renal pelvis could not be obtained.  There was a small ureteral perforation that was noted.  A ureteral stent was placed which he has not tolerated well.  He presents for follow-up ureteroscopy. After reviewing the management options for treatment, the patient elected to proceed with the above surgical procedure(s). We have discussed the potential benefits and risks of the procedure, side effects of the proposed treatment, the likelihood of the patient achieving the goals of the procedure, and any potential problems that might occur during the procedure or recuperation. Informed consent has been obtained.  Description of procedure:  The patient was taken to the operating room and general anesthesia was induced.  The patient was placed in the dorsal lithotomy position, prepped and draped in the usual sterile fashion, and preoperative antibiotics were administered. A preoperative time-out was performed.   A 22 French cystoscope was lubricated and passed under direct vision.  The urethra was normal in caliber without stricture.  The prostate demonstrated mild lateral lobe enlargement.  Panendoscopy was performed  and the bladder mucosa showed no erythema, solid or papillary lesions.  There were mild inflammatory changes at the right ureteral orifice secondary to the indwelling stent  Attention was directed to the right ureteral orifice and a 0.038 Sensor wire was then advanced up the ureter into the renal pelvis under fluoroscopic guidance.  The cystoscope was removed and re-passed.  The stent was grasped with endoscopic forceps and brought out to the urethral meatus.  A second guidewire was placed up the stent and advanced into the renal pelvis without difficulty.  The stent was then removed.  A single channel flexible ureteroscope was then placed over the second wire and advanced into the ureter without difficulty.  The working guidewire was removed and the ureteroscope was easily advanced proximally into the renal pelvis.  There was no evidence of the previous perforation noted in the proximal ureter.  The renal pelvis and each calyx were examined.  There were multiple Randall plaques in the mid lower pole calyces.  The 10 mm calculus was identified in an upper pole calyx.  A 273 micron holmium laser fiber was placed through the ureteroscope and the stone was dusted at a setting of 0.2 J and frequency of 40 hz.  Once the stone is almost completely fragmented.  I broke up into a few larger fragments.  All significantly sized fragments were then removed from the collecting system with a zero tip nitinol basket.  The laser fiber was then replaced and the small fragments in the upper pole calyx were further fragmented at a setting of 0.2 J / 20 Hz.  Retrograde pyelogram was performed and each calyx was sequentially examined under fluoroscopic guidance and no significant size fragments were identified.  A 4.8 FR/26 CM stent was was placed under fluoroscopic  guidance.  The wire was then removed with an adequate stent curl noted in the renal pelvis as well as in the bladder.  The bladder was then emptied and the  procedure ended.  The patient appeared to tolerate the procedure well and without complications.  After anesthetic reversal the patient was transported to the PACU in stable condition.   Plan: He will follow-up early next week for stent removal.

## 2017-11-15 ENCOUNTER — Other Ambulatory Visit: Payer: Self-pay | Admitting: Urology

## 2017-11-15 DIAGNOSIS — N200 Calculus of kidney: Secondary | ICD-10-CM

## 2017-11-18 LAB — STONE ANALYSIS
CA PHOS CRY STONE QL IR: 5 %
Ca Oxalate,Dihydrate: 15 %
Ca Oxalate,Monohydr.: 80 %
STONE WEIGHT KSTONE: 56.8 mg

## 2017-11-21 ENCOUNTER — Ambulatory Visit
Admission: RE | Admit: 2017-11-21 | Discharge: 2017-11-21 | Disposition: A | Discharge status: Home / Self Care | Payer: Medicare Other | Source: Ambulatory Visit | Attending: Urology | Admitting: Urology

## 2017-11-21 ENCOUNTER — Encounter: Payer: Self-pay | Admitting: Urology

## 2017-11-21 ENCOUNTER — Ambulatory Visit (INDEPENDENT_AMBULATORY_CARE_PROVIDER_SITE_OTHER): Payer: Medicare Other | Admitting: Urology

## 2017-11-21 VITALS — BP 166/82 | HR 50 | Ht 74.0 in | Wt 199.0 lb

## 2017-11-21 DIAGNOSIS — N200 Calculus of kidney: Secondary | ICD-10-CM

## 2017-11-21 DIAGNOSIS — R3129 Other microscopic hematuria: Secondary | ICD-10-CM

## 2017-11-21 DIAGNOSIS — Z09 Encounter for follow-up examination after completed treatment for conditions other than malignant neoplasm: Secondary | ICD-10-CM | POA: Diagnosis not present

## 2017-11-21 MED ORDER — LIDOCAINE HCL URETHRAL/MUCOSAL 2 % EX GEL
1.0000 "application " | Freq: Once | CUTANEOUS | Status: AC
  Administered 2017-11-21: 1 via URETHRAL

## 2017-11-22 LAB — URINALYSIS, COMPLETE
BILIRUBIN UA: NEGATIVE
GLUCOSE, UA: NEGATIVE
Ketones, UA: NEGATIVE
Nitrite, UA: NEGATIVE
PH UA: 6 (ref 5.0–7.5)
Specific Gravity, UA: >1.03 — ABNORMAL HIGH (ref 1.005–1.030)
UUROB: 0.2 mg/dL (ref 0.2–1.0)

## 2017-11-22 LAB — MICROSCOPIC EXAMINATION: RBC, UA: >30 /hpf — ABNORMAL HIGH (ref 0–2)

## 2017-11-27 NOTE — Progress Notes (Signed)
Indications: Patient is 77 y.o., male who recently underwent ureteroscopic removal of a 10 mm right upper pole renal calculus.  Postoperative KUB reviewed and there are no calcifications noted overlying the renal outline and along the stent. The patient is presenting today for stent removal.  Procedure:  Flexible Cystoscopy with stent removal (09811)  Timeout was performed and the correct patient, procedure and participants were identified.    Description:  The patient was prepped and draped in the usual sterile fashion. Flexible cystosopy was performed.  The stent was visualized, grasped, and removed intact without difficulty. The patient tolerated the procedure well.  A single dose of oral antibiotics was given.  Complications:  None  Plan: He was instructed to call for development of flank pain or fever after stent removal.  He is getting ready to leave for [place] for the winter and will follow-up in April with a KUB.

## 2017-12-29 DIAGNOSIS — K439 Ventral hernia without obstruction or gangrene: Secondary | ICD-10-CM | POA: Insufficient documentation

## 2017-12-29 DIAGNOSIS — N200 Calculus of kidney: Secondary | ICD-10-CM | POA: Insufficient documentation

## 2018-04-26 ENCOUNTER — Other Ambulatory Visit: Payer: Self-pay | Admitting: Urology

## 2018-04-26 DIAGNOSIS — Z87442 Personal history of urinary calculi: Secondary | ICD-10-CM

## 2018-05-21 ENCOUNTER — Ambulatory Visit
Admission: RE | Admit: 2018-05-21 | Discharge: 2018-05-21 | Disposition: A | Discharge status: Home / Self Care | Payer: Medicare Other | Source: Ambulatory Visit | Attending: Urology | Admitting: Urology

## 2018-05-21 ENCOUNTER — Other Ambulatory Visit: Payer: Self-pay

## 2018-05-21 DIAGNOSIS — Z87442 Personal history of urinary calculi: Secondary | ICD-10-CM | POA: Diagnosis not present

## 2018-05-23 ENCOUNTER — Other Ambulatory Visit: Payer: Self-pay

## 2018-05-23 ENCOUNTER — Telehealth (INDEPENDENT_AMBULATORY_CARE_PROVIDER_SITE_OTHER): Payer: Medicare Other | Admitting: Urology

## 2018-05-23 DIAGNOSIS — Z87442 Personal history of urinary calculi: Secondary | ICD-10-CM | POA: Diagnosis not present

## 2018-05-23 NOTE — Progress Notes (Signed)
Virtual Visit via Telephone Note  I connected with Kurt Hernandez on 05/23/18 at 11:15 AM EDT by telephone and verified that I am speaking with the correct person using two identifiers.  Location: Patient: Home Provider: Home office   I discussed the limitations, risks, security and privacy concerns of performing an evaluation and management service by telephone and the availability of in person appointments. I also discussed with the patient that there may be a patient responsible charge related to this service. The patient expressed understanding and agreed to proceed.   History of Present Illness: 78 year old male presents for follow-up telephone visit secondary to COVID-19 pandemic.  Kurt Hernandez is status post ureteroscopic removal of a 10 mm right upper pole calculus in November 2019.  He presents for six-month follow-up and states he is doing well.  Denies dysuria, gross hematuria or flank/abdominal/pelvic/scrotal pain.  KUB performed on 05/21/2018 was reviewed and there are no calcifications seen suspicious for urinary tract calculi.  His preoperative CT did not show renal calculi.   Observations/Objective:   Assessment and Plan: 78 year old male with a history of stone disease currently doing well without evidence of recurrent calculi.  Follow Up Instructions: Follow-up 1 year with a KUB.   I discussed the assessment and treatment plan with the patient. The patient was provided an opportunity to ask questions and all were answered. The patient agreed with the plan and demonstrated an understanding of the instructions.   The patient was advised to call back or seek an in-person evaluation if the symptoms worsen or if the condition fails to improve as anticipated.  I provided 7 minutes of non-face-to-face time during this encounter.   Abbie Sons, MD

## 2018-06-27 DIAGNOSIS — E782 Mixed hyperlipidemia: Secondary | ICD-10-CM | POA: Insufficient documentation
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
## 2018-09-19 IMAGING — CT CT ANKLE*R* W/O CM
2 series · 16 of 27 positions shown, 20 images · non-contrast
Comparison: Radiographs of same day.

CLINICAL DATA: Calcaneal fracture.

EXAM:
CT OF THE RIGHT ANKLE WITHOUT CONTRAST
TECHNIQUE: Multidetector CT imaging of the right ankle was performed according
to the standard protocol. Multiplanar CT image reconstructions were
also generated.

[Series 602: long axis · axial · 0.50mm/px · z∈[-358,-247]mm · 11 of 84 slices shown, 14 images]
[im 7/84  soft-tissue]
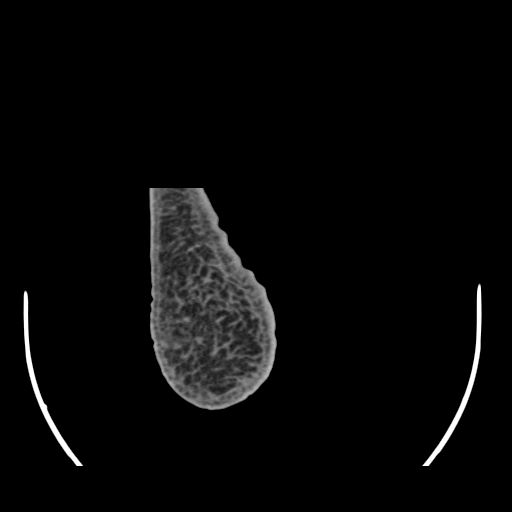
[im 7/84  bone]
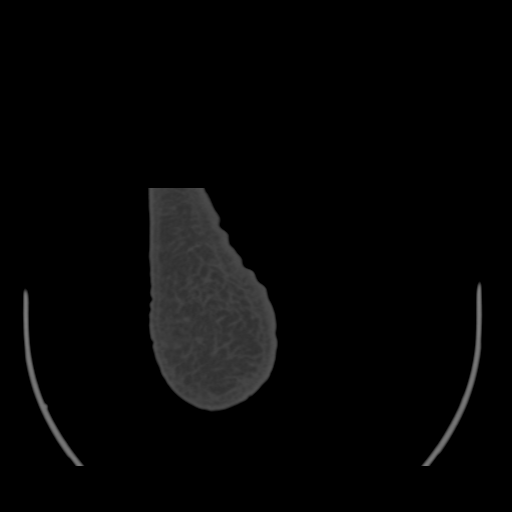
[im 13/84  bone]
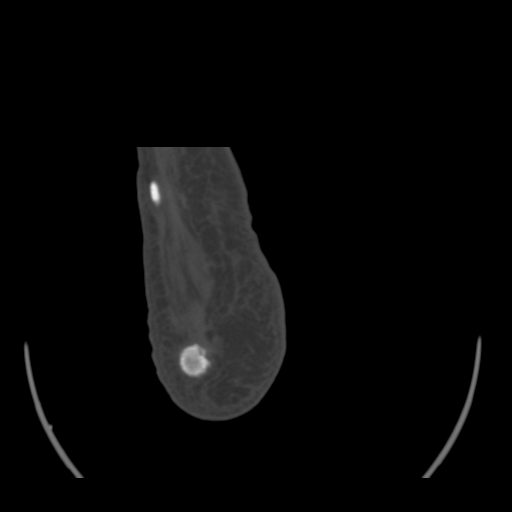
[im 20/84  bone]
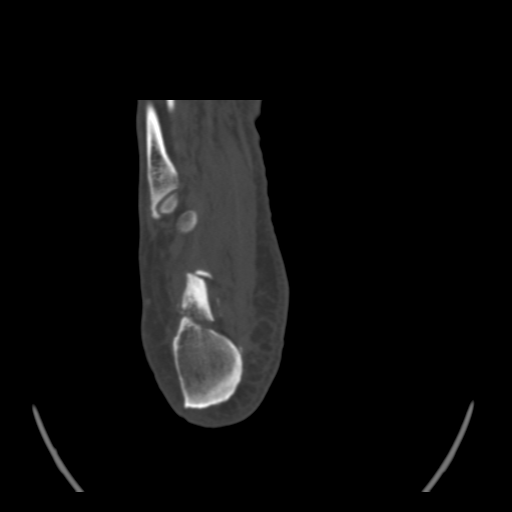
[im 26/84  bone]
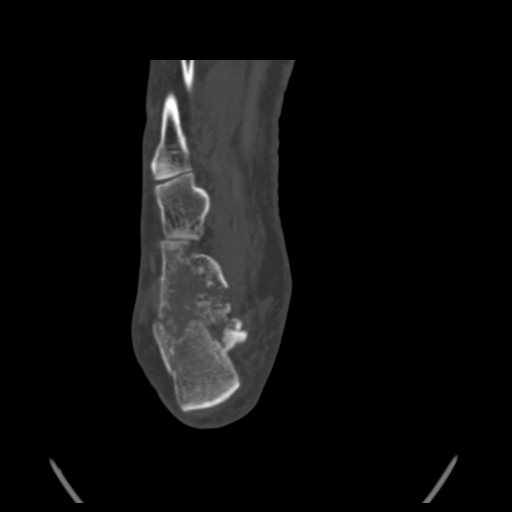
[im 32/84  soft-tissue]
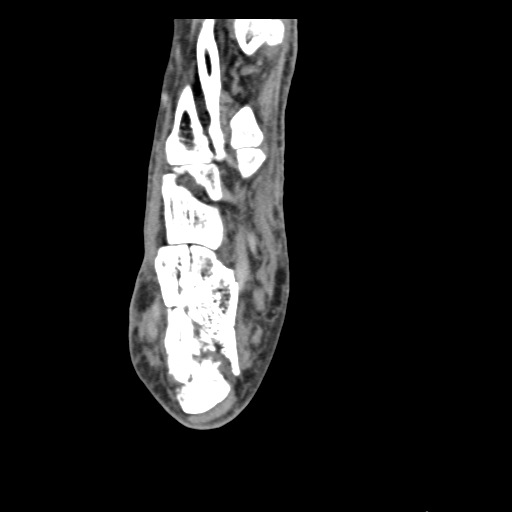
[im 32/84  bone]
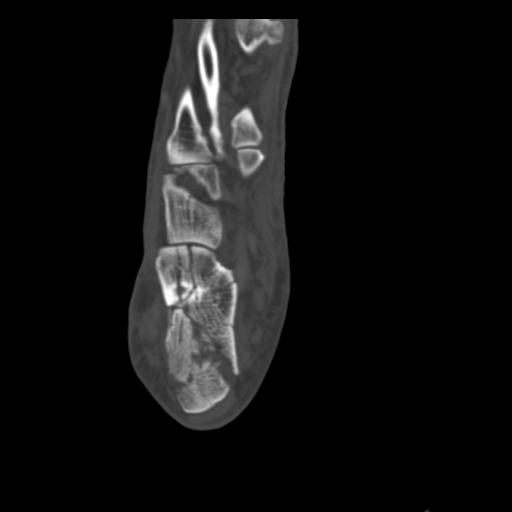
[im 45/84  bone]
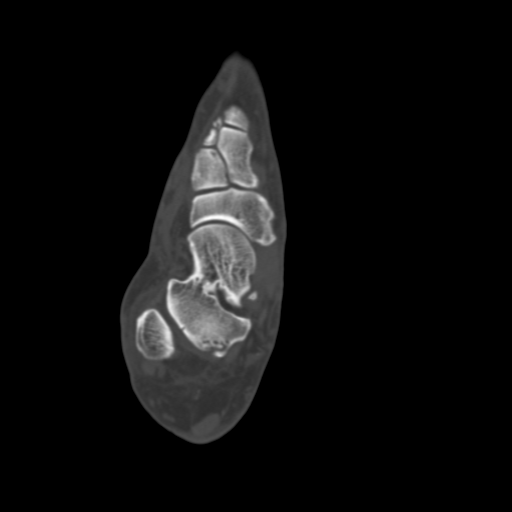
[im 52/84  bone]
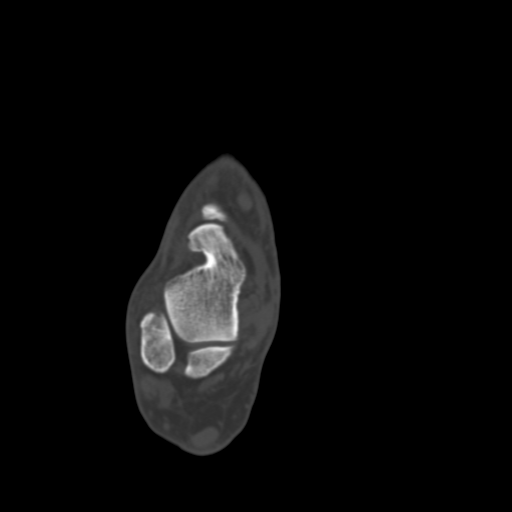
[im 58/84  bone]
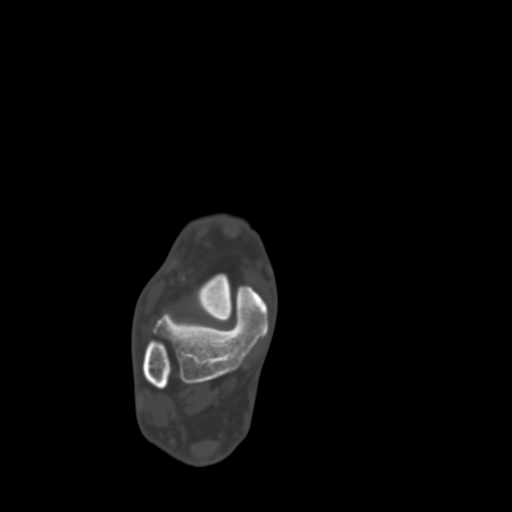
[im 64/84  soft-tissue]
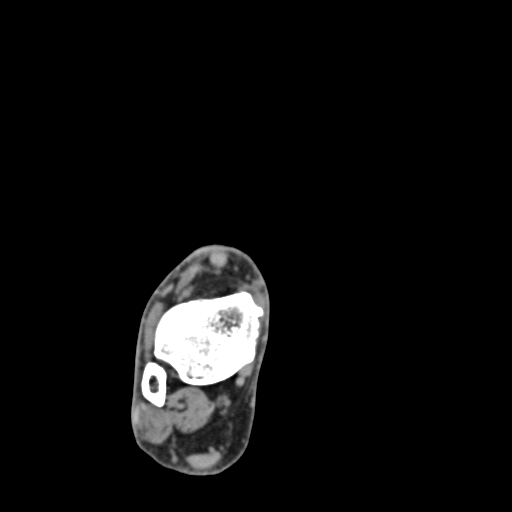
[im 64/84  bone]
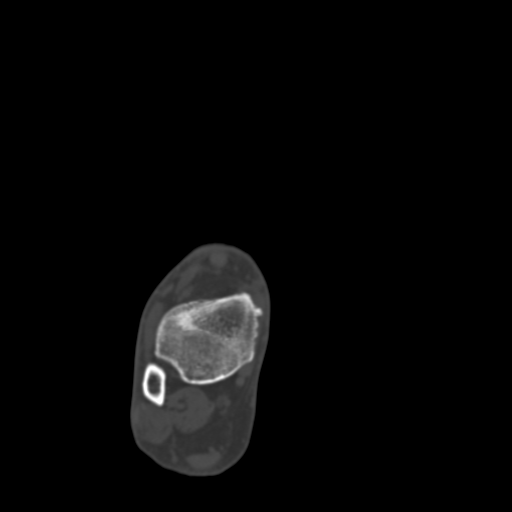
[im 71/84  bone]
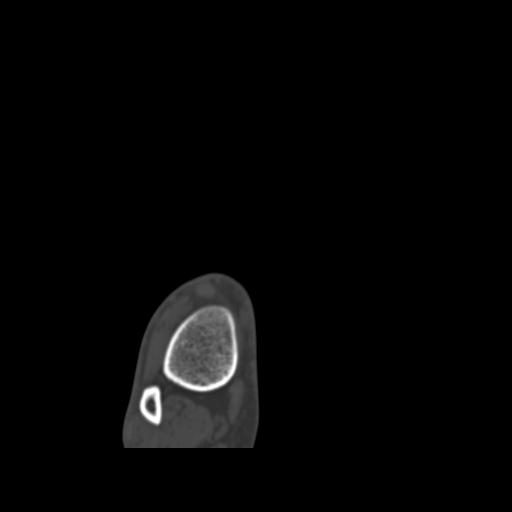
[im 77/84  bone]
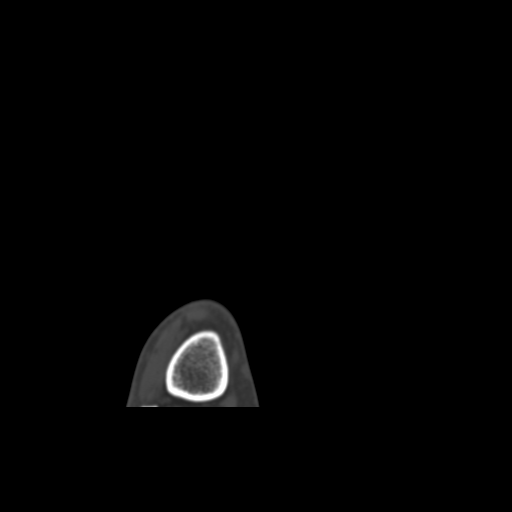

[Series 604: sag · sagittal · 0.50mm/px · 5 of 45 slices shown, 6 images]
[im 15/45  bone]
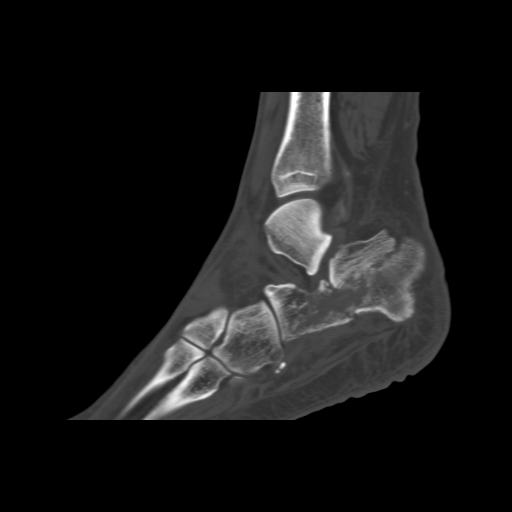
[im 19/45  bone]
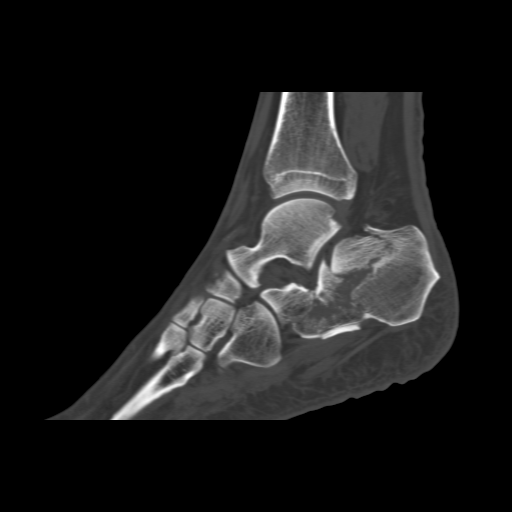
[im 23/45  soft-tissue]
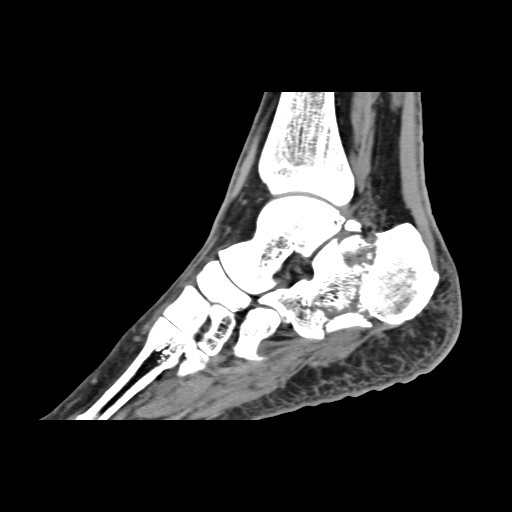
[im 23/45  bone]
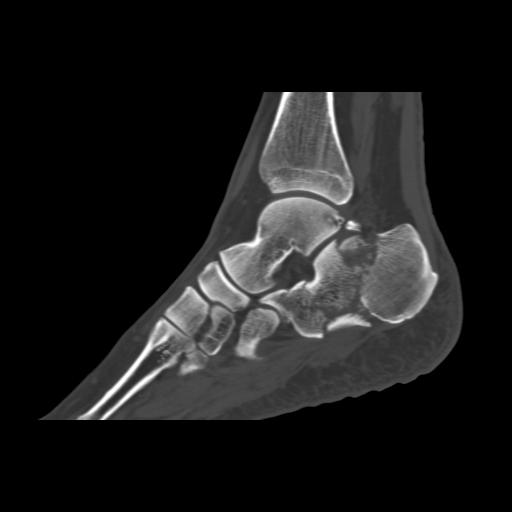
[im 26/45  bone]
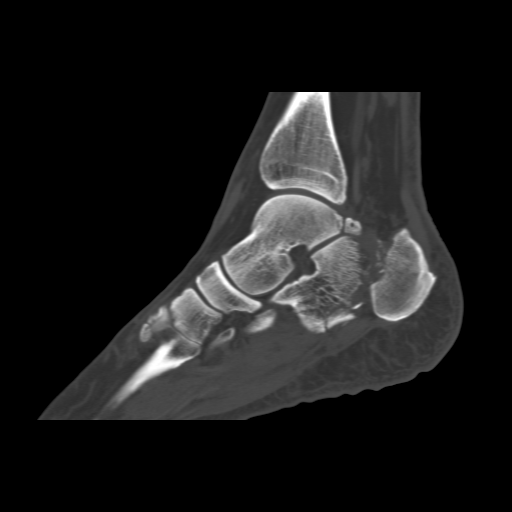
[im 30/45  bone]
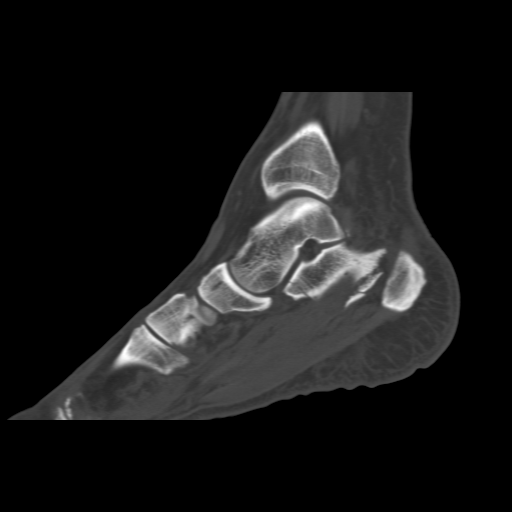

[16 of 27 positions shown; findings below may reference images not displayed]

FINDINGS: Severely comminuted calcaneal fracture is noted involving its
anterior and posterior portions. Largest fragment is the posterior
portion. One large fragment which articulates with the posterior
portion of the talus is rotated anteriorly. The remaining visualized
bones appear normal. No significant soft tissue abnormality is
noted.
IMPRESSION: Severely comminuted calcaneal fracture is noted.

## 2019-01-01 DIAGNOSIS — N1831 Chronic kidney disease, stage 3a: Secondary | ICD-10-CM | POA: Insufficient documentation

## 2019-01-01 DIAGNOSIS — N1832 Chronic kidney disease, stage 3b: Secondary | ICD-10-CM | POA: Insufficient documentation

## 2019-04-16 DIAGNOSIS — Z8679 Personal history of other diseases of the circulatory system: Secondary | ICD-10-CM | POA: Insufficient documentation

## 2019-04-16 DIAGNOSIS — I2699 Other pulmonary embolism without acute cor pulmonale: Secondary | ICD-10-CM | POA: Insufficient documentation

## 2019-04-26 DIAGNOSIS — E1122 Type 2 diabetes mellitus with diabetic chronic kidney disease: Secondary | ICD-10-CM | POA: Insufficient documentation

## 2019-05-23 ENCOUNTER — Ambulatory Visit: Payer: Medicare Other | Admitting: Urology

## 2019-05-24 ENCOUNTER — Other Ambulatory Visit: Payer: Self-pay

## 2019-05-24 ENCOUNTER — Ambulatory Visit (INDEPENDENT_AMBULATORY_CARE_PROVIDER_SITE_OTHER): Payer: Medicare Other | Admitting: Urology

## 2019-05-24 ENCOUNTER — Encounter: Payer: Self-pay | Admitting: Urology

## 2019-05-24 VITALS — BP 180/95 | HR 58 | Ht 74.5 in | Wt 197.7 lb

## 2019-05-24 DIAGNOSIS — Z87442 Personal history of urinary calculi: Secondary | ICD-10-CM | POA: Diagnosis not present

## 2019-05-24 DIAGNOSIS — R3911 Hesitancy of micturition: Secondary | ICD-10-CM | POA: Diagnosis not present

## 2019-05-24 DIAGNOSIS — R3913 Splitting of urinary stream: Secondary | ICD-10-CM | POA: Diagnosis not present

## 2019-05-24 DIAGNOSIS — N390 Urinary tract infection, site not specified: Secondary | ICD-10-CM

## 2019-05-24 DIAGNOSIS — Z87440 Personal history of urinary (tract) infections: Secondary | ICD-10-CM

## 2019-05-24 LAB — BLADDER SCAN AMB NON-IMAGING

## 2019-05-24 NOTE — Progress Notes (Signed)
05/24/2019 1:49 PM   Kurt Hernandez July 10, 1940 [id number]  Referring provider: Rusty Aus, MD [place] [facility] [place],  [place] [address]  Chief Complaint  Patient presents with  . Nephrolithiasis    HPI: 79 y.o. male presents for annual follow-up.  He has a history of recurrent stone disease and underwent ureteroscopic stone removal November 2019.  He spends the winters in [place] and was hospitalized in March 2020 for a UTI and states he had a complicated hospital course developing an arrhythmia, bilateral pulmonary emboli and hyperglycemia.  No previous history recurrent UTI.  He does not think he had any abdominal imaging.  Records of that hospitalization are not in Epic.  Since his hospitalization he does void with a split urinary stream and has urinary hesitancy with post void dribbling which has not previously been present.  Follow-up urinalysis and Dr. Ammie Ferrier office 05/10/2019 was clear.  PMH: Past Medical History:  Diagnosis Date  . Anemia    pernicious anemia  . Arrhythmia    patient unaware of diagnosis except heart skips beats  . Cancer (HCC)    lip cancer  . Complication of anesthesia    unable to void after surgery and had to stay overnight for several days w/ cath  . Headache   . History of kidney stones   . Hypertension   . Kidney stone     Surgical History: Past Surgical History:  Procedure Laterality Date  . CYSTOSCOPY W/ RETROGRADES Right 11/14/2017   Procedure: CYSTOSCOPY WITH RETROGRADE PYELOGRAM;  Surgeon: Abbie Sons, MD;  Location: [facility];  Service: Urology;  Laterality: Right;  . CYSTOSCOPY WITH STENT PLACEMENT Right 11/03/2017   Procedure: CYSTOSCOPY WITH STENT PLACEMENT;  Surgeon: Abbie Sons, MD;  Location: [facility];  Service: Urology;  Laterality: Right;  . CYSTOSCOPY/RETROGRADE/URETEROSCOPY Right 11/03/2017   Procedure: CYSTOSCOPY/RETROGRADE/URETEROSCOPY;  Surgeon: Abbie Sons,  MD;  Location: [facility];  Service: Urology;  Laterality: Right;  . CYSTOSCOPY/URETEROSCOPY/HOLMIUM LASER/STENT PLACEMENT Right 11/14/2017   Procedure: CYSTOSCOPY/URETEROSCOPY/HOLMIUM LASER/STENT Exchange;  Surgeon: Abbie Sons, MD;  Location: [facility];  Service: Urology;  Laterality: Right;  stent exchange  . EYE SURGERY Right    cataract extraction.  needed yag laser and still has problems   . HAND SURGERY Right 1989   tendons came undone from bones in wrist. metal removed  . HERNIA REPAIR Bilateral 2010   inguinal hernias  . HERNIA REPAIR      Home Medications:  Allergies as of 05/24/2019      Reactions   Mirtazapine    Other reaction(s): Dizziness      Medication List       Accurate as of May 24, 2019  1:49 PM. If you have any questions, ask your nurse or doctor.        amiodarone 200 MG tablet Commonly known as: PACERONE TAKE 2 TABLETS BY MOUTH THREE TIMES DAILY FOR 2 DAYS THEN ONE TABLET ONCE EVERY DAY   aspirin 81 MG tablet Take 81 mg by mouth daily.   aspirin-acetaminophen-caffeine 250-250-65 MG tablet Commonly known as: EXCEDRIN MIGRAINE Take 1 tablet by mouth daily as needed for headache.   azelastine 0.1 % nasal spray Commonly known as: ASTELIN Place 1 spray into both nostrils at bedtime. Use in each nostril as directed   B-12 5000 MCG Caps Take 5,000 mcg by mouth daily.   BD Veo Insulin Syringe U/F 31G X 15/64" 0.3 ML Misc Generic drug: Insulin  Syringe-Needle U-100 USE TO INJECT INSULIN 6 TIMES DAILY   Contour Next Test test strip Generic drug: glucose blood TEST 3 TIMES A DAY AS DIRECTED   Eliquis 5 MG Tabs tablet Generic drug: apixaban Take 5 mg by mouth 2 (two) times daily.   fluocinolone 0.01 % external solution Commonly known as: SYNALAR fluocinolone 0.01 % topical solution   gabapentin 300 MG capsule Commonly known as: NEURONTIN Take 300 mg by mouth See admin instructions. Take 300 mg by mouth 2 times daily, may take a 3rd 300 mg dose  as needed for pain   glipiZIDE 5 MG tablet Commonly known as: GLUCOTROL   Levemir 100 UNIT/ML injection Generic drug: insulin detemir SMARTSIG:10 Unit(s) SUB-Q Every 12 Hours   levothyroxine 75 MCG tablet Commonly known as: SYNTHROID Take by mouth.   lisinopril 5 MG tablet Commonly known as: ZESTRIL Take 5 mg by mouth daily.   Magnesium 500 MG Tabs Take 500 mg by mouth daily.   meloxicam 15 MG tablet Commonly known as: MOBIC Take 7.5 mg by mouth at bedtime.   metFORMIN 500 MG tablet Commonly known as: GLUCOPHAGE Take 500 mg by mouth 2 (two) times daily.   metoprolol succinate 25 MG 24 hr tablet Commonly known as: TOPROL-XL Take 12.5 mg by mouth at bedtime.   NovoLIN R 100 units/mL injection Generic drug: insulin regular PLEASE SEE ATTACHED FOR DETAILED DIRECTIONS   omeprazole 40 MG capsule Commonly known as: PRILOSEC Take 40 mg by mouth daily.   ondansetron 4 MG disintegrating tablet Commonly known as: Zofran ODT Take 1 tablet (4 mg total) by mouth every 8 (eight) hours as needed for nausea or vomiting.   OVER THE COUNTER MEDICATION Take 1 tablet by mouth daily. Nugenix otc supplement   oxybutynin 5 MG tablet Commonly known as: DITROPAN 1 tab tid prn frequency,urgency, bladder spasm   oxyCODONE-acetaminophen 5-325 MG tablet Commonly known as: PERCOCET/ROXICET Take 1 tablet by mouth every 6 (six) hours as needed for severe pain.   pantoprazole 40 MG tablet Commonly known as: PROTONIX pantoprazole 40 mg tablet,delayed release   Prevagen 10 MG Caps Generic drug: Apoaequorin Take 10 mg by mouth daily.   tamsulosin 0.4 MG Caps capsule Commonly known as: FLOMAX Take 1 capsule (0.4 mg total) by mouth daily.   triamcinolone ointment 0.1 % Commonly known as: KENALOG APPLY THIN FILM TO PSORIASIS SITES TWICE DAILY UNTIL FLAT THEN USE ONLY AS NEEDED       Allergies:  Allergies  Allergen Reactions  . Mirtazapine     Other reaction(s): Dizziness     Family History: Family History  Problem Relation Age of Onset  . Heart attack Father   . Stroke Mother     Social History:  reports that he has never smoked. He has never used smokeless tobacco. He reports that he does not drink alcohol or use drugs.   Physical Exam: BP (!) 180/95 (BP Location: Left Arm, Patient Position: Sitting, Cuff Size: Normal)   Pulse (!) 58   Ht 6' 2.5" (1.892 m)   Wt 197 lb 11.2 oz (89.7 kg)   BMI 25.04 kg/m   Constitutional:  Alert and oriented, No acute distress. HEENT:  AT, moist mucus membranes.  Trachea midline, no masses. Cardiovascular: No clubbing, cyanosis, or edema. Respiratory: Normal respiratory effort, no increased work of breathing. GI: Abdomen is soft, nontender, nondistended, no abdominal masses GU: No CVA tenderness Lymph: No cervical or inguinal lymphadenopathy. Skin: No rashes, bruises or suspicious lesions. Neurologic: Grossly  intact, no focal deficits, moving all 4 extremities. Psychiatric: Normal mood and affect.   Assessment & Plan:   79 y.o. male with a history of nephrolithiasis hospitalized March while in [place] with a urinary tract infection with a complicated hospital course.  Presently with urinary hesitancy and a split urinary stream.  PVR by bladder scan today was 77 mL.  I recommended scheduling a renal ultrasound and cystoscopy.   Abbie Sons, [place] [address] [place], [place] [address] [phone]

## 2019-05-24 NOTE — Patient Instructions (Signed)
Cystoscopy Cystoscopy is a procedure that is used to help diagnose and sometimes treat conditions that affect the lower urinary tract. The lower urinary tract includes the bladder and the urethra. The urethra is the tube that drains urine from the bladder. Cystoscopy is done using a thin, tube-shaped instrument with a light and camera at the end (cystoscope). The cystoscope may be hard or flexible, depending on the goal of the procedure. The cystoscope is inserted through the urethra, into the bladder. Cystoscopy may be recommended if you have:  Urinary tract infections that keep coming back.  Blood in the urine (hematuria).  An inability to control when you urinate (urinary incontinence) or an overactive bladder.  Unusual cells found in a urine sample.  A blockage in the urethra, such as a urinary stone.  Painful urination.  An abnormality in the bladder found during an intravenous pyelogram (IVP) or CT scan. Cystoscopy may also be done to remove a sample of tissue to be examined under a microscope (biopsy). What are the risks? Generally, this is a safe procedure. However, problems may occur, including:  Infection.  Bleeding.  What happens during the procedure?  1. You will be given one or more of the following: ? A medicine to numb the area (local anesthetic). 2. The area around the opening of your urethra will be cleaned. 3. The cystoscope will be passed through your urethra into your bladder. 4. Germ-free (sterile) fluid will flow through the cystoscope to fill your bladder. The fluid will stretch your bladder so that your health care provider can clearly examine your bladder walls. 5. Your doctor will look at the urethra and bladder. 6. The cystoscope will be removed The procedure may vary among health care providers  What can I expect after the procedure? After the procedure, it is common to have: 1. Some soreness or pain in your abdomen and urethra. 2. Urinary symptoms.  These include: ? Mild pain or burning when you urinate. Pain should stop within a few minutes after you urinate. This may last for up to 1 week. ? A small amount of blood in your urine for several days. ? Feeling like you need to urinate but producing only a small amount of urine. Follow these instructions at home: General instructions  Return to your normal activities as told by your health care provider.   Do not drive for 24 hours if you were given a sedative during your procedure.  Watch for any blood in your urine. If the amount of blood in your urine increases, call your health care provider.  If a tissue sample was removed for testing (biopsy) during your procedure, it is up to you to get your test results. Ask your health care provider, or the department that is doing the test, when your results will be ready.  Drink enough fluid to keep your urine pale yellow.  Keep all follow-up visits as told by your health care provider. This is important. Contact a health care provider if you:  Have pain that gets worse or does not get better with medicine, especially pain when you urinate.  Have trouble urinating.  Have more blood in your urine. Get help right away if you:  Have blood clots in your urine.  Have abdominal pain.  Have a fever or chills.  Are unable to urinate. Summary  Cystoscopy is a procedure that is used to help diagnose and sometimes treat conditions that affect the lower urinary tract.  Cystoscopy is done using   a thin, tube-shaped instrument with a light and camera at the end.  After the procedure, it is common to have some soreness or pain in your abdomen and urethra.  Watch for any blood in your urine. If the amount of blood in your urine increases, call your health care provider.  If you were prescribed an antibiotic medicine, take it as told by your health care provider. Do not stop taking the antibiotic even if you start to feel better. This  information is not intended to replace advice given to you by your health care provider. Make sure you discuss any questions you have with your health care provider. Document Revised: 12/19/2017 Document Reviewed: 12/19/2017 Elsevier Patient Education  2020 [employer]   

## 2019-06-12 ENCOUNTER — Other Ambulatory Visit: Payer: Self-pay | Admitting: Urology

## 2019-06-14 ENCOUNTER — Other Ambulatory Visit: Payer: Self-pay

## 2019-06-14 ENCOUNTER — Ambulatory Visit
Admission: RE | Admit: 2019-06-14 | Discharge: 2019-06-14 | Disposition: A | Discharge status: Home / Self Care | Payer: Medicare Other | Source: Ambulatory Visit | Attending: Urology | Admitting: Urology

## 2019-06-14 DIAGNOSIS — Z87440 Personal history of urinary (tract) infections: Secondary | ICD-10-CM | POA: Diagnosis present

## 2019-06-14 DIAGNOSIS — N390 Urinary tract infection, site not specified: Secondary | ICD-10-CM | POA: Insufficient documentation

## 2019-06-14 DIAGNOSIS — Z87442 Personal history of urinary calculi: Secondary | ICD-10-CM | POA: Insufficient documentation

## 2019-06-14 DIAGNOSIS — R3913 Splitting of urinary stream: Secondary | ICD-10-CM | POA: Insufficient documentation

## 2019-06-14 DIAGNOSIS — R3911 Hesitancy of micturition: Secondary | ICD-10-CM | POA: Diagnosis present

## 2019-06-26 ENCOUNTER — Ambulatory Visit (INDEPENDENT_AMBULATORY_CARE_PROVIDER_SITE_OTHER): Payer: Medicare Other | Admitting: Urology

## 2019-06-26 ENCOUNTER — Encounter: Payer: Self-pay | Admitting: Urology

## 2019-06-26 ENCOUNTER — Other Ambulatory Visit: Payer: Self-pay

## 2019-06-26 VITALS — BP 182/80 | HR 53 | Ht 74.5 in | Wt 193.2 lb

## 2019-06-26 DIAGNOSIS — R399 Unspecified symptoms and signs involving the genitourinary system: Secondary | ICD-10-CM

## 2019-06-26 NOTE — Progress Notes (Signed)
° °  06/26/2019  CC:  Chief Complaint  Patient presents with   Cysto    HPI: Kurt Hernandez is a 79 y.o. male who presents today for a cystoscopy. - See prior note 05/24/2019 - Notes urinary symptoms have improved - RUS without hydronephrosis, renal mass, calculi; moderate prostate enlargement  NED. A&Ox3.   No respiratory distress   Abd soft, NT, ND Normal phallus with bilateral descended testicles  Cystoscopy Procedure Note  Patient identification was confirmed, informed consent was obtained, and patient was prepped using Betadine solution.  Lidocaine jelly was administered per urethral meatus.     Pre-Procedure: - Inspection reveals a normal caliber urethral meatus.  Procedure: The flexible cystoscope was introduced without difficulty - No urethral strictures/lesions are present. - Prostatic urethra had moderate lateral lobe enlargement  - Normal bladder neck - Bilateral ureteral orifices identified - Bladder mucosa  reveals no ulcers, tumors, or lesions - No bladder stones - No trabeculation  Retroflexion shows no median lobe    Post-Procedure: - Patient tolerated the procedure well  Assessment/ Plan:  1.  Lower urinary tract symptoms -Marked symptom improvement -No significant abnormalities, moderate prostate enlargement on cystoscopy.  -Follow-up 1 year or earlier for recurrent voiding or UTI symptoms  I, Kyla Peace, am acting as a Education administrator for Dr. Nicki Reaper C. Treyvone Chelf.  I have reviewed the above documentation for accuracy and completeness, and I agree with the above.   Abbie Sons, MD

## 2019-07-01 LAB — URINALYSIS, COMPLETE
Bilirubin, UA: NEGATIVE
Ketones, UA: NEGATIVE
Leukocytes,UA: NEGATIVE
Nitrite, UA: NEGATIVE
Protein,UA: NEGATIVE
Specific Gravity, UA: >1.03 — ABNORMAL HIGH (ref 1.005–1.030)
Urobilinogen, Ur: 0.2 mg/dL (ref 0.2–1.0)
pH, UA: 5.5 (ref 5.0–7.5)

## 2019-07-01 LAB — MICROSCOPIC EXAMINATION
Bacteria, UA: NONE SEEN
Epithelial Cells (non renal): NONE SEEN /hpf (ref 0–10)

## 2019-09-19 ENCOUNTER — Emergency Department: Payer: Medicare Other

## 2019-09-19 ENCOUNTER — Inpatient Hospital Stay
Admission: EM | Admit: 2019-09-19 | Discharge: 2019-09-22 | DRG: 177 | Disposition: A | Discharge status: Home Health | Payer: Medicare Other | Attending: Internal Medicine | Admitting: Internal Medicine

## 2019-09-19 ENCOUNTER — Encounter: Payer: Self-pay | Admitting: Emergency Medicine

## 2019-09-19 ENCOUNTER — Other Ambulatory Visit: Payer: Self-pay

## 2019-09-19 DIAGNOSIS — J189 Pneumonia, unspecified organism: Secondary | ICD-10-CM

## 2019-09-19 DIAGNOSIS — Z7901 Long term (current) use of anticoagulants: Secondary | ICD-10-CM

## 2019-09-19 DIAGNOSIS — U071 COVID-19: Principal | ICD-10-CM | POA: Diagnosis present

## 2019-09-19 DIAGNOSIS — J1282 Pneumonia due to coronavirus disease 2019: Secondary | ICD-10-CM | POA: Diagnosis present

## 2019-09-19 DIAGNOSIS — Z791 Long term (current) use of non-steroidal anti-inflammatories (NSAID): Secondary | ICD-10-CM

## 2019-09-19 DIAGNOSIS — Z79899 Other long term (current) drug therapy: Secondary | ICD-10-CM

## 2019-09-19 DIAGNOSIS — G629 Polyneuropathy, unspecified: Secondary | ICD-10-CM

## 2019-09-19 DIAGNOSIS — Z794 Long term (current) use of insulin: Secondary | ICD-10-CM

## 2019-09-19 DIAGNOSIS — E782 Mixed hyperlipidemia: Secondary | ICD-10-CM | POA: Diagnosis present

## 2019-09-19 DIAGNOSIS — N1832 Chronic kidney disease, stage 3b: Secondary | ICD-10-CM | POA: Diagnosis present

## 2019-09-19 DIAGNOSIS — I10 Essential (primary) hypertension: Secondary | ICD-10-CM | POA: Diagnosis present

## 2019-09-19 DIAGNOSIS — I4891 Unspecified atrial fibrillation: Secondary | ICD-10-CM | POA: Diagnosis present

## 2019-09-19 DIAGNOSIS — N1831 Chronic kidney disease, stage 3a: Secondary | ICD-10-CM | POA: Diagnosis present

## 2019-09-19 DIAGNOSIS — J9601 Acute respiratory failure with hypoxia: Secondary | ICD-10-CM | POA: Diagnosis present

## 2019-09-19 DIAGNOSIS — R531 Weakness: Secondary | ICD-10-CM

## 2019-09-19 DIAGNOSIS — I129 Hypertensive chronic kidney disease with stage 1 through stage 4 chronic kidney disease, or unspecified chronic kidney disease: Secondary | ICD-10-CM | POA: Diagnosis present

## 2019-09-19 DIAGNOSIS — Z85819 Personal history of malignant neoplasm of unspecified site of lip, oral cavity, and pharynx: Secondary | ICD-10-CM

## 2019-09-19 DIAGNOSIS — D696 Thrombocytopenia, unspecified: Secondary | ICD-10-CM | POA: Diagnosis present

## 2019-09-19 DIAGNOSIS — E1122 Type 2 diabetes mellitus with diabetic chronic kidney disease: Secondary | ICD-10-CM | POA: Diagnosis present

## 2019-09-19 DIAGNOSIS — Z79890 Hormone replacement therapy (postmenopausal): Secondary | ICD-10-CM

## 2019-09-19 DIAGNOSIS — E039 Hypothyroidism, unspecified: Secondary | ICD-10-CM | POA: Diagnosis present

## 2019-09-19 DIAGNOSIS — E1142 Type 2 diabetes mellitus with diabetic polyneuropathy: Secondary | ICD-10-CM | POA: Diagnosis present

## 2019-09-19 DIAGNOSIS — Z8679 Personal history of other diseases of the circulatory system: Secondary | ICD-10-CM

## 2019-09-19 DIAGNOSIS — Z7982 Long term (current) use of aspirin: Secondary | ICD-10-CM

## 2019-09-19 LAB — GLUCOSE, CAPILLARY: Glucose-Capillary: 152 mg/dL — ABNORMAL HIGH (ref 70–99)

## 2019-09-19 LAB — CBC
HCT: 41 % (ref 39.0–52.0)
Hemoglobin: 14.9 g/dL (ref 13.0–17.0)
MCH: 30.7 pg (ref 26.0–34.0)
MCHC: 36.3 g/dL — ABNORMAL HIGH (ref 30.0–36.0)
MCV: 84.4 fL (ref 80.0–100.0)
Platelets: 127 10*3/uL — ABNORMAL LOW (ref 150–400)
RBC: 4.86 MIL/uL (ref 4.22–5.81)
RDW: 12.1 % (ref 11.5–15.5)
WBC: 4.3 10*3/uL (ref 4.0–10.5)
nRBC: 0 % (ref 0.0–0.2)

## 2019-09-19 LAB — URINALYSIS, COMPLETE (UACMP) WITH MICROSCOPIC
Bacteria, UA: NONE SEEN
Bilirubin Urine: NEGATIVE
Glucose, UA: NEGATIVE mg/dL
Ketones, ur: 20 mg/dL — AB
Leukocytes,Ua: NEGATIVE
Nitrite: NEGATIVE
Protein, ur: 30 mg/dL — AB
Specific Gravity, Urine: 1.021 (ref 1.005–1.030)
pH: 5 (ref 5.0–8.0)

## 2019-09-19 LAB — BASIC METABOLIC PANEL
Anion gap: 9 (ref 5–15)
BUN: 25 mg/dL — ABNORMAL HIGH (ref 8–23)
CO2: 22 mmol/L (ref 22–32)
Calcium: 9 mg/dL (ref 8.9–10.3)
Chloride: 102 mmol/L (ref 98–111)
Creatinine, Ser: 1.52 mg/dL — ABNORMAL HIGH (ref 0.61–1.24)
GFR calc Af Amer: 50 mL/min — ABNORMAL LOW (ref >60)
GFR calc non Af Amer: 44 mL/min — ABNORMAL LOW (ref >60)
Glucose, Bld: 161 mg/dL — ABNORMAL HIGH (ref 70–99)
Potassium: 4.6 mmol/L (ref 3.5–5.1)
Sodium: 133 mmol/L — ABNORMAL LOW (ref 135–145)

## 2019-09-19 LAB — TSH: TSH: 2.128 u[IU]/mL (ref 0.350–4.500)

## 2019-09-19 LAB — PROCALCITONIN: Procalcitonin: 1.76 ng/mL

## 2019-09-19 MED ORDER — SODIUM CHLORIDE 0.9 % IV SOLN
1.0000 g | Freq: Once | INTRAVENOUS | Status: AC
  Administered 2019-09-20: 1 g via INTRAVENOUS
  Filled 2019-09-19: qty 10

## 2019-09-19 MED ORDER — SODIUM CHLORIDE 0.9 % IV BOLUS
1000.0000 mL | Freq: Once | INTRAVENOUS | Status: AC
  Administered 2019-09-19: 1000 mL via INTRAVENOUS

## 2019-09-19 MED ORDER — ONDANSETRON 4 MG PO TBDP
4.0000 mg | ORAL_TABLET | Freq: Once | ORAL | Status: AC
  Administered 2019-09-19: 4 mg via ORAL
  Filled 2019-09-19: qty 1

## 2019-09-19 MED ORDER — SODIUM CHLORIDE 0.9 % IV SOLN
500.0000 mg | Freq: Once | INTRAVENOUS | Status: AC
  Administered 2019-09-20: 500 mg via INTRAVENOUS
  Filled 2019-09-19: qty 500

## 2019-09-19 NOTE — ED Notes (Signed)
Lab called for repeat blood draw for lab d/t ER staff not being able to get blood

## 2019-09-19 NOTE — ED Triage Notes (Signed)
Pt in via EMS form home with c/o weakness for 3 weeks. Pt was put on abx by MD and feels worse. Pt states symptoms have worsened. Pt bleeding from penis and has hx fo UTI. 140/78, 80's HR, FSB 150

## 2019-09-19 NOTE — ED Provider Notes (Signed)
[facility] Emergency Department Provider Note   ____________________________________________   I have reviewed the triage vital signs and the nursing notes.   HISTORY  Chief Complaint Feel sick  History limited by: Not Limited   HPI Kurt Hernandez is a 79 y.o. male who presents to the emergency department today because he states he feels ill.  The patient states that he has felt bad for the past 3 months.  Symptoms primarily consist of the patient feeling generalized weakness.  States he has had some shortness of breath and cough.  He denies any pain.  States he has had subjective fevers.  Has not received the Covid vaccine although was tested earlier in this illness for Covid and it was negative.  Patient states he had somewhat similar symptoms in the past when he was diagnosed with a UTI.  He states he does not quite feel as bad as he did then.   Records reviewed. Per medical record review patient has a history of HTN  Past Medical History:  Diagnosis Date  . Anemia    pernicious anemia  . Arrhythmia    patient unaware of diagnosis except heart skips beats  . Cancer (HCC)    lip cancer  . Complication of anesthesia    unable to void after surgery and had to stay overnight for several days w/ cath  . Headache   . History of kidney stones   . Hypertension   . Kidney stone     Patient Active Problem List   Diagnosis Date Noted  . Type 2 diabetes mellitus with stage 3b chronic kidney disease, without long-term current use of insulin ([place]) 04/26/2019  . Bilateral pulmonary embolism ([place]) 04/16/2019  . History of atrial fibrillation 04/16/2019  . Stage 3b chronic kidney disease 01/01/2019  . Hyperlipidemia, mixed 06/27/2018  . Abdominal wall hernia 12/29/2017  . Nephrolithiasis 12/29/2017  . Preoperative cardiovascular examination 10/24/2017  . Peroneal tendinitis 09/08/2017  . 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 Closed fracture of heel bone  09/08/2017  . Osteoarthritis of carpometacarpal (CMC) joint of thumb 09/08/2017  . Acquired hypothyroidism 11/21/2016  . PVC's (premature ventricular contractions) 08/24/2016  . Benign essential hypertension 11/19/2015  . 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 Lumbar radiculopathy 06/17/2013  . Peripheral neuropathy 05/13/2013  . Pernicious anemia 05/13/2013    Past Surgical History:  Procedure Laterality Date  . CYSTOSCOPY W/ RETROGRADES Right 11/14/2017   Procedure: CYSTOSCOPY WITH RETROGRADE PYELOGRAM;  Surgeon: Abbie Sons, MD;  Location: [facility] ORS;  Service: Urology;  Laterality: Right;  . CYSTOSCOPY WITH STENT PLACEMENT Right 11/03/2017   Procedure: CYSTOSCOPY WITH STENT PLACEMENT;  Surgeon: Abbie Sons, MD;  Location: [facility] ORS;  Service: Urology;  Laterality: Right;  . CYSTOSCOPY/RETROGRADE/URETEROSCOPY Right 11/03/2017   Procedure: CYSTOSCOPY/RETROGRADE/URETEROSCOPY;  Surgeon: Abbie Sons, MD;  Location: [facility] ORS;  Service: Urology;  Laterality: Right;  . CYSTOSCOPY/URETEROSCOPY/HOLMIUM LASER/STENT PLACEMENT Right 11/14/2017   Procedure: CYSTOSCOPY/URETEROSCOPY/HOLMIUM LASER/STENT Exchange;  Surgeon: Abbie Sons, MD;  Location: [facility] OR[facility];  Service: Urology;  Laterality: Right;  stent exchange  . EYE SURGERY Right    cataract extraction.  needed yag laser and still has problems   . HAND SURGERY Right 1989   tendons came undone from bones in wrist. metal removed  . HERNIA REPAIR Bilateral 2010   inguinal hernias  . HERNIA REPAIR      Prior to Admission medications   Medication Sig Start Date End Date Taking? Authorizing Provider  amiodarone (PACERONE) 200 MG tablet TAKE 2 TABLETS BY MOUTH THREE TIMES DAILY FOR 2 DAYS THEN ONE TABLET ONCE EVERY DAY 04/06/19   [provider]  Apoaequorin (PREVAGEN) 10 MG CAPS Take 10 mg by mouth daily.    [provider]  aspirin 81 MG tablet Take 81 mg by mouth daily.    [provider]  aspirin-acetaminophen-caffeine (EXCEDRIN MIGRAINE) [id number] MG tablet Take 1 tablet by mouth daily as needed for headache.    [provider]  azelastine (ASTELIN) 0.1 % nasal spray Place 1 spray into both nostrils at bedtime. Use in each nostril as directed    [provider]  BD VEO INSULIN SYRINGE U/F 31G X 15/64" 0.3 ML MISC USE TO INJECT INSULIN 6 TIMES DAILY 04/05/19   [provider]  CONTOUR NEXT TEST test strip TEST 3 TIMES A DAY AS DIRECTED 04/06/19   [provider]  Cyanocobalamin (B-12) 5000 MCG CAPS Take 5,000 mcg by mouth daily.    [provider]  ELIQUIS 5 MG TABS tablet Take 5 mg by mouth 2 (two) times daily. 04/29/19   [provider]  fluocinolone (SYNALAR) 0.01 % external solution fluocinolone 0.01 % topical solution    [provider]  gabapentin (NEURONTIN) 300 MG capsule Take 300 mg by mouth See admin instructions. Take 300 mg by mouth 2 times daily, may take a 3rd 300 mg dose as needed for pain 08/03/16   [provider]  glipiZIDE (GLUCOTROL) 5 MG tablet  05/17/19   [provider]  LEVEMIR 100 UNIT/ML injection SMARTSIG:10 Unit(s) SUB-Q Every 12 Hours 04/05/19   [provider]  levothyroxine (SYNTHROID) 75 MCG tablet Take by mouth. 01/01/19 01/01/20  [provider]  lisinopril (ZESTRIL) 5 MG tablet Take 5 mg by mouth daily. 04/02/19   [provider]  Magnesium 500 MG TABS Take 500 mg by mouth daily.    [provider]  meloxicam (MOBIC) 15 MG tablet Take 7.5 mg by mouth at bedtime.    [provider]  metFORMIN (GLUCOPHAGE) 500 MG tablet Take 500 mg by mouth 2 (two) times daily. 03/29/19   [provider]  metoprolol succinate (TOPROL-XL) 25 MG 24 hr tablet Take 12.5 mg by mouth at bedtime.  07/20/16   [provider]  NOVOLIN R 100 UNIT/ML injection PLEASE SEE ATTACHED FOR DETAILED DIRECTIONS 04/05/19   [provider]   omeprazole (PRILOSEC) 40 MG capsule Take 40 mg by mouth daily. 04/16/19   [provider]  ondansetron (ZOFRAN ODT) 4 MG disintegrating tablet Take 1 tablet (4 mg total) by mouth every 8 (eight) hours as needed for nausea or vomiting. 11/04/17   Darel Hong, MD  OVER THE COUNTER MEDICATION Take 1 tablet by mouth daily. Nugenix otc supplement    [provider]  oxybutynin (DITROPAN) 5 MG tablet 1 tab tid prn frequency,urgency, bladder spasm 11/03/17   Stoioff, Ronda Fairly, MD  oxyCODONE-acetaminophen (PERCOCET/ROXICET) 5-325 MG tablet Take 1 tablet by mouth every 6 (six) hours as needed for severe pain. Patient not taking: Reported on 06/26/2019 11/03/17   Darel Hong, MD  pantoprazole (PROTONIX) 40 MG tablet pantoprazole 40 mg tablet,delayed release    [provider]  tamsulosin (FLOMAX) 0.4 MG CAPS capsule Take 1 capsule (0.4 mg total) by mouth daily. 11/03/17   Stoioff, Ronda Fairly, MD  triamcinolone ointment (KENALOG) 0.1 % APPLY THIN FILM TO PSORIASIS SITES TWICE DAILY UNTIL FLAT THEN USE ONLY AS NEEDED 11/06/17  [provider]    Allergies Mirtazapine  Family History  Problem Relation Age of Onset  . Heart attack Father   . Stroke Mother     Social History Social History   Tobacco Use  . Smoking status: Never Smoker  . Smokeless tobacco: Never Used  Vaping Use  . Vaping Use: Never used  Substance Use Topics  . Alcohol use: No    Comment: rare  . Drug use: No    Review of Systems Constitutional: Positive for subjective fever.  Eyes: No visual changes. ENT: No sore throat. Cardiovascular: Denies chest pain. Respiratory: Positive for cough and shortness of breath. Gastrointestinal: No abdominal pain.  No nausea, no vomiting.  No diarrhea.   Genitourinary: Negative for dysuria. Musculoskeletal: Negative for back pain. Skin: Negative for rash. Neurological: Negative for headaches, focal weakness or  numbness.  ____________________________________________   PHYSICAL EXAM:  VITAL SIGNS: ED Triage Vitals  Enc Vitals Group     BP 09/19/19 1137 124/72     Pulse Rate 09/19/19 1137 81     Resp 09/19/19 1137 18     Temp 09/19/19 1137 98.6 F (37 C)     Temp Source 09/19/19 1137 Oral     SpO2 09/19/19 1137 99 %     Weight 09/19/19 1137 175 lb (79.4 kg)     Height 09/19/19 1137 6\' 3"  (1.905 m)     Head Circumference --      Peak Flow --      Pain Score 09/19/19 1204 6   Constitutional: Alert and oriented.  Eyes: Conjunctivae are normal.  ENT      Head: Normocephalic and atraumatic.      Nose: No congestion/rhinnorhea.      Mouth/Throat: Mucous membranes are moist.      Neck: No stridor. Hematological/Lymphatic/Immunilogical: No cervical lymphadenopathy. Cardiovascular: Normal rate, regular rhythm.  No murmurs, rubs, or gallops.  Respiratory: Normal respiratory effort without tachypnea nor retractions. Breath sounds are clear and equal bilaterally. No wheezes/rales/rhonchi. Gastrointestinal: Soft and non tender. No rebound. No guarding.  Genitourinary: Deferred Musculoskeletal: Normal range of motion in all extremities. No lower extremity edema. Neurologic:  Normal speech and language. No gross focal neurologic deficits are appreciated.  Skin:  Skin is warm, dry and intact. No rash noted. Psychiatric: Mood and affect are normal. Speech and behavior are normal. Patient exhibits appropriate insight and judgment.  ____________________________________________    LABS (pertinent positives/negatives)  BMP na 133, k 4.6, glu 161, cr 1.52 CBC wbc 4.3, hgb 14.9, plt 127 UA hazy, small hgb dipstick  ____________________________________________   EKG  I, Nance Pear, attending physician, personally viewed and interpreted this EKG  EKG Time: 1142 Rate: 82 Rhythm: normal sinus rhythm Axis: normal Intervals: qtc 427 QRS: narrow ST changes: no st elevation Impression:  abnormal ekg  ____________________________________________    RADIOLOGY  CXR Subsegmental right lung base opacity, thought to represent atelectasis over infection  ____________________________________________   PROCEDURES  Procedures  ____________________________________________   INITIAL IMPRESSION / ASSESSMENT AND PLAN / ED COURSE  Pertinent labs & imaging results that were available during my care of the patient were reviewed by me and considered in my medical decision making (see chart for details).   Patient presented to the emergency department today because of concerns for continued feelings of illness and weakness for the past 3 weeks.  On exam patient vital signs without concerning tachycardia or hypotension.  Work-up showed chest x-ray with possible atelectasis versus pneumonia.  Procalcitonin  was elevated given concern for possible pneumonia. Will plan on admission for iv antibiotics and further work up and management.    ____________________________________________   FINAL CLINICAL IMPRESSION(S) / ED DIAGNOSES  Final diagnoses:  Weakness  Pneumonia due to infectious organism, unspecified laterality, unspecified part of lung     Note: This dictation was prepared with Dragon dictation. Any transcriptional errors that result from this process are unintentional     Nance Pear, MD 09/19/19 [id number]

## 2019-09-19 NOTE — ED Notes (Signed)
Called from main waiting room for vitals, no answer.

## 2019-09-19 NOTE — ED Triage Notes (Signed)
Pt in via [facility] from home, complaints of hyperglycemia w/ glucose >500.  Pt also reports recently finishing round of antibiotics but does not recall what the antibiotics were for.  Pt A/Ox4, vitals WDL, NAD noted at this time.

## 2019-09-19 NOTE — ED Notes (Signed)
Pt was given a warm blanket and a urinal per request

## 2019-09-20 DIAGNOSIS — J9600 Acute respiratory failure, unspecified whether with hypoxia or hypercapnia: Secondary | ICD-10-CM | POA: Diagnosis present

## 2019-09-20 DIAGNOSIS — J9601 Acute respiratory failure with hypoxia: Secondary | ICD-10-CM | POA: Diagnosis present

## 2019-09-20 DIAGNOSIS — U071 COVID-19: Secondary | ICD-10-CM | POA: Diagnosis present

## 2019-09-20 DIAGNOSIS — E1122 Type 2 diabetes mellitus with diabetic chronic kidney disease: Secondary | ICD-10-CM | POA: Diagnosis present

## 2019-09-20 DIAGNOSIS — I129 Hypertensive chronic kidney disease with stage 1 through stage 4 chronic kidney disease, or unspecified chronic kidney disease: Secondary | ICD-10-CM | POA: Diagnosis present

## 2019-09-20 DIAGNOSIS — E1142 Type 2 diabetes mellitus with diabetic polyneuropathy: Secondary | ICD-10-CM | POA: Diagnosis present

## 2019-09-20 DIAGNOSIS — E039 Hypothyroidism, unspecified: Secondary | ICD-10-CM | POA: Diagnosis present

## 2019-09-20 DIAGNOSIS — R531 Weakness: Secondary | ICD-10-CM | POA: Diagnosis present

## 2019-09-20 DIAGNOSIS — Z79890 Hormone replacement therapy (postmenopausal): Secondary | ICD-10-CM | POA: Diagnosis not present

## 2019-09-20 DIAGNOSIS — Z79899 Other long term (current) drug therapy: Secondary | ICD-10-CM | POA: Diagnosis not present

## 2019-09-20 DIAGNOSIS — N1832 Chronic kidney disease, stage 3b: Secondary | ICD-10-CM | POA: Diagnosis present

## 2019-09-20 DIAGNOSIS — Z791 Long term (current) use of non-steroidal anti-inflammatories (NSAID): Secondary | ICD-10-CM | POA: Diagnosis not present

## 2019-09-20 DIAGNOSIS — I4891 Unspecified atrial fibrillation: Secondary | ICD-10-CM | POA: Diagnosis present

## 2019-09-20 DIAGNOSIS — Z85819 Personal history of malignant neoplasm of unspecified site of lip, oral cavity, and pharynx: Secondary | ICD-10-CM | POA: Diagnosis not present

## 2019-09-20 DIAGNOSIS — Z794 Long term (current) use of insulin: Secondary | ICD-10-CM | POA: Diagnosis not present

## 2019-09-20 DIAGNOSIS — Z7901 Long term (current) use of anticoagulants: Secondary | ICD-10-CM | POA: Diagnosis not present

## 2019-09-20 DIAGNOSIS — Z7982 Long term (current) use of aspirin: Secondary | ICD-10-CM | POA: Diagnosis not present

## 2019-09-20 DIAGNOSIS — J1282 Pneumonia due to coronavirus disease 2019: Secondary | ICD-10-CM | POA: Diagnosis present

## 2019-09-20 DIAGNOSIS — D696 Thrombocytopenia, unspecified: Secondary | ICD-10-CM | POA: Diagnosis present

## 2019-09-20 DIAGNOSIS — E782 Mixed hyperlipidemia: Secondary | ICD-10-CM | POA: Diagnosis present

## 2019-09-20 HISTORY — DX: Acute respiratory failure, unspecified whether with hypoxia or hypercapnia: J96.00

## 2019-09-20 HISTORY — DX: COVID-19: U07.1

## 2019-09-20 LAB — C-REACTIVE PROTEIN: CRP: 1.5 mg/dL — ABNORMAL HIGH (ref <1.0)

## 2019-09-20 LAB — GLUCOSE, CAPILLARY
Glucose-Capillary: 251 mg/dL — ABNORMAL HIGH (ref 70–99)
Glucose-Capillary: 197 mg/dL — ABNORMAL HIGH (ref 70–99)
Glucose-Capillary: 182 mg/dL — ABNORMAL HIGH (ref 70–99)
Glucose-Capillary: 212 mg/dL — ABNORMAL HIGH (ref 70–99)

## 2019-09-20 LAB — CBC
HCT: 36.4 % — ABNORMAL LOW (ref 39.0–52.0)
Hemoglobin: 13.6 g/dL (ref 13.0–17.0)
MCH: 31.1 pg (ref 26.0–34.0)
MCHC: 37.4 g/dL — ABNORMAL HIGH (ref 30.0–36.0)
MCV: 83.1 fL (ref 80.0–100.0)
Platelets: 106 10*3/uL — ABNORMAL LOW (ref 150–400)
RBC: 4.38 MIL/uL (ref 4.22–5.81)
RDW: 12.1 % (ref 11.5–15.5)
WBC: 3.4 10*3/uL — ABNORMAL LOW (ref 4.0–10.5)
nRBC: 0 % (ref 0.0–0.2)

## 2019-09-20 LAB — FIBRIN DERIVATIVES D-DIMER (ARMC ONLY): Fibrin derivatives D-dimer (ARMC): 523.65 ng/mL (FEU) — ABNORMAL HIGH (ref 0.00–499.00)

## 2019-09-20 LAB — CREATININE, SERUM
Creatinine, Ser: 1.24 mg/dL (ref 0.61–1.24)
GFR calc Af Amer: >60 mL/min (ref >60)
GFR calc non Af Amer: 56 mL/min — ABNORMAL LOW (ref >60)

## 2019-09-20 LAB — SARS CORONAVIRUS 2 BY RT PCR (HOSPITAL ORDER, PERFORMED IN ~~LOC~~ HOSPITAL LAB): SARS Coronavirus 2: POSITIVE — AB

## 2019-09-20 MED ORDER — ACETAMINOPHEN 325 MG PO TABS
650.0000 mg | ORAL_TABLET | Freq: Four times a day (QID) | ORAL | Status: DC | PRN
  Administered 2019-09-20: 650 mg via ORAL
  Filled 2019-09-20: qty 2

## 2019-09-20 MED ORDER — ZINC SULFATE 220 (50 ZN) MG PO CAPS
220.0000 mg | ORAL_CAPSULE | Freq: Every day | ORAL | Status: DC
  Administered 2019-09-20 – 2019-09-22 (×3): 220 mg via ORAL
  Filled 2019-09-20 (×3): qty 1

## 2019-09-20 MED ORDER — ONDANSETRON HCL 4 MG PO TABS: 4.0000 mg | ORAL_TABLET | Freq: Four times a day (QID) | ORAL | Status: DC | PRN

## 2019-09-20 MED ORDER — TRAMADOL HCL 50 MG PO TABS
50.0000 mg | ORAL_TABLET | Freq: Four times a day (QID) | ORAL | Status: DC | PRN
  Administered 2019-09-20: 50 mg via ORAL
  Filled 2019-09-20: qty 1

## 2019-09-20 MED ORDER — HYDROCOD POLST-CPM POLST ER 10-8 MG/5ML PO SUER: 5.0000 mL | Freq: Two times a day (BID) | ORAL | Status: DC | PRN

## 2019-09-20 MED ORDER — METOPROLOL SUCCINATE ER 25 MG PO TB24
25.0000 mg | ORAL_TABLET | Freq: Every day | ORAL | Status: DC
  Administered 2019-09-20 – 2019-09-22 (×3): 25 mg via ORAL
  Filled 2019-09-20 (×4): qty 1

## 2019-09-20 MED ORDER — ONDANSETRON HCL 4 MG/2ML IJ SOLN: 4.0000 mg | Freq: Four times a day (QID) | INTRAMUSCULAR | Status: DC | PRN

## 2019-09-20 MED ORDER — IPRATROPIUM-ALBUTEROL 20-100 MCG/ACT IN AERS
1.0000 | INHALATION_SPRAY | Freq: Four times a day (QID) | RESPIRATORY_TRACT | Status: DC
  Administered 2019-09-20 – 2019-09-22 (×9): 1 via RESPIRATORY_TRACT
  Filled 2019-09-20 (×2): qty 4

## 2019-09-20 MED ORDER — SODIUM CHLORIDE 0.9 % IV SOLN: 200.0000 mg | Freq: Once | INTRAVENOUS | Status: DC

## 2019-09-20 MED ORDER — SODIUM CHLORIDE 0.9 % IV SOLN: 100.0000 mg | Freq: Every day | INTRAVENOUS | Status: DC

## 2019-09-20 MED ORDER — BARICITINIB 2 MG PO TABS
2.0000 mg | ORAL_TABLET | Freq: Every day | ORAL | Status: DC
  Administered 2019-09-20 – 2019-09-22 (×3): 2 mg via ORAL
  Filled 2019-09-20 (×3): qty 1

## 2019-09-20 MED ORDER — INSULIN DETEMIR 100 UNIT/ML ~~LOC~~ SOLN
10.0000 [IU] | Freq: Two times a day (BID) | SUBCUTANEOUS | Status: DC
  Administered 2019-09-20 – 2019-09-22 (×5): 10 [IU] via SUBCUTANEOUS
  Filled 2019-09-20 (×7): qty 0.1

## 2019-09-20 MED ORDER — GUAIFENESIN-DM 100-10 MG/5ML PO SYRP
10.0000 mL | ORAL_SOLUTION | ORAL | Status: DC | PRN
  Filled 2019-09-20: qty 10

## 2019-09-20 MED ORDER — DEXAMETHASONE SODIUM PHOSPHATE 10 MG/ML IJ SOLN
6.0000 mg | INTRAMUSCULAR | Status: DC
  Administered 2019-09-20 – 2019-09-22 (×3): 6 mg via INTRAVENOUS
  Filled 2019-09-20 (×3): qty 1

## 2019-09-20 MED ORDER — ASCORBIC ACID 500 MG PO TABS
500.0000 mg | ORAL_TABLET | Freq: Every day | ORAL | Status: DC
  Administered 2019-09-20 – 2019-09-22 (×3): 500 mg via ORAL
  Filled 2019-09-20 (×3): qty 1

## 2019-09-20 MED ORDER — SODIUM CHLORIDE 0.9 % IV SOLN
200.0000 mg | Freq: Once | INTRAVENOUS | Status: AC
  Administered 2019-09-20: 200 mg via INTRAVENOUS
  Filled 2019-09-20: qty 200

## 2019-09-20 MED ORDER — ENOXAPARIN SODIUM 40 MG/0.4ML ~~LOC~~ SOLN
40.0000 mg | SUBCUTANEOUS | Status: DC
  Administered 2019-09-20: 40 mg via SUBCUTANEOUS
  Filled 2019-09-20: qty 0.4

## 2019-09-20 MED ORDER — APIXABAN 5 MG PO TABS
5.0000 mg | ORAL_TABLET | Freq: Two times a day (BID) | ORAL | Status: DC
  Administered 2019-09-21 – 2019-09-22 (×4): 5 mg via ORAL
  Filled 2019-09-20 (×4): qty 1

## 2019-09-20 MED ORDER — INSULIN ASPART 100 UNIT/ML ~~LOC~~ SOLN
0.0000 [IU] | Freq: Three times a day (TID) | SUBCUTANEOUS | Status: DC
  Administered 2019-09-20: 11 [IU] via SUBCUTANEOUS
  Administered 2019-09-20 (×2): 4 [IU] via SUBCUTANEOUS
  Administered 2019-09-21: 7 [IU] via SUBCUTANEOUS
  Administered 2019-09-21: 09:00:00 4 [IU] via SUBCUTANEOUS
  Administered 2019-09-21 – 2019-09-22 (×2): 7 [IU] via SUBCUTANEOUS
  Administered 2019-09-22: 12:00:00 11 [IU] via SUBCUTANEOUS
  Filled 2019-09-20 (×9): qty 1

## 2019-09-20 MED ORDER — GABAPENTIN 300 MG PO CAPS
300.0000 mg | ORAL_CAPSULE | Freq: Three times a day (TID) | ORAL | Status: DC
  Administered 2019-09-20 – 2019-09-22 (×7): 300 mg via ORAL
  Filled 2019-09-20 (×7): qty 1

## 2019-09-20 MED ORDER — LEVOTHYROXINE SODIUM 25 MCG PO TABS
75.0000 ug | ORAL_TABLET | Freq: Every day | ORAL | Status: DC
  Administered 2019-09-20 – 2019-09-22 (×3): 75 ug via ORAL
  Filled 2019-09-20: qty 1
  Filled 2019-09-20 (×2): qty 3
  Filled 2019-09-20: qty 1

## 2019-09-20 MED ORDER — AZELASTINE HCL 0.1 % NA SOLN
1.0000 | Freq: Two times a day (BID) | NASAL | Status: DC
  Administered 2019-09-21 (×2): 1 via NASAL
  Filled 2019-09-20: qty 30

## 2019-09-20 MED ORDER — SODIUM CHLORIDE 0.9 % IV SOLN
100.0000 mg | Freq: Every day | INTRAVENOUS | Status: DC
  Administered 2019-09-21 – 2019-09-22 (×2): 100 mg via INTRAVENOUS
  Filled 2019-09-20 (×3): qty 20

## 2019-09-20 MED ORDER — PANTOPRAZOLE SODIUM 40 MG PO TBEC
40.0000 mg | DELAYED_RELEASE_TABLET | Freq: Every day | ORAL | Status: DC
  Administered 2019-09-21 – 2019-09-22 (×2): 40 mg via ORAL
  Filled 2019-09-20 (×3): qty 1

## 2019-09-20 NOTE — ED Notes (Signed)
Provided pt with lunch tray

## 2019-09-20 NOTE — Progress Notes (Signed)
Remdesivir - Pharmacy Brief Note   O:  CXR: IMPRESSION: 1. Subsegmental right lung base opacity, favor atelectasis over pneumonia. 2. Borderline hyperinflation with mild bronchial thickening, may represent bronchitis, asthma, or smoking related lung disease. Recommend correlation for smoking history. SpO2: 97 - 99% on RA   A/P:  Remdesivir 200 mg IVPB once followed by 100 mg IVPB daily x 4 days.   Tobie Lords, PharmD, BCPS Clinical Pharmacist 09/20/2019 12:44 AM

## 2019-09-20 NOTE — H&P (Signed)
History and Physical   Kurt Hernandez BTD:[id number] DOB: 11/22/1940 DOA: 09/19/2019  Referring MD/NP/PA: Dr. Archie Balboa  PCP: Rusty Aus, MD   Outpatient Specialists: None  Patient coming from: Home  Chief Complaint: Shortness of breath and cough  HPI: Kurt Hernandez is a 79 y.o. male with medical history significant of hypertension, atrial fibrillation, pernicious anemia and history of kidney stones who has been feeling weak seek for the last 3 months on and off.  In the last month he was tested for Covid was negative.  He continues to feel shortness of breath and cough.  He has subjective fevers.  Patient was not vaccinated against COVID-19.  He also has had UTI in the past that felt like this.  Patient came to the ER today was evaluated and found to be mildly hypoxic.  He desats to 91% on room air with activity but largely around 94%.  No fever no white count.  He is thrombocytopenic and appears to be dehydrated.  COVID-19 testing is now positive.  Patient being admitted with COVID-19 pneumonia as seen on his chest x-ray..  ED Course: Patient is afebrile.  Sodium 133 potassium 4.6 chloride 102 CO2 22 glucose 166 BUN 25 creatinine 1.52 and calcium 9.0.  Procalcitonin 1.76, TSH 2.128 COVID-19 is positive chest x-ray showed subsegmental right lung basal opacity consistent with pneumonia.  Also bronchial thickening.  He has platelets of 127.  Patient being admitted with COVID-19 pneumonia.  Review of Systems: As per HPI otherwise 10 point review of systems negative.    Past Medical History:  Diagnosis Date  . Anemia    pernicious anemia  . Arrhythmia    patient unaware of diagnosis except heart skips beats  . Cancer (HCC)    lip cancer  . Complication of anesthesia    unable to void after surgery and had to stay overnight for several days w/ cath  . Headache   . History of kidney stones   . Hypertension   . Kidney stone     Past Surgical History:  Procedure Laterality Date   . CYSTOSCOPY W/ RETROGRADES Right 11/14/2017   Procedure: CYSTOSCOPY WITH RETROGRADE PYELOGRAM;  Surgeon: Abbie Sons, MD;  Location: [facility];  Service: Urology;  Laterality: Right;  . CYSTOSCOPY WITH STENT PLACEMENT Right 11/03/2017   Procedure: CYSTOSCOPY WITH STENT PLACEMENT;  Surgeon: Abbie Sons, MD;  Location: [facility];  Service: Urology;  Laterality: Right;  . CYSTOSCOPY/RETROGRADE/URETEROSCOPY Right 11/03/2017   Procedure: CYSTOSCOPY/RETROGRADE/URETEROSCOPY;  Surgeon: Abbie Sons, MD;  Location: [facility];  Service: Urology;  Laterality: Right;  . CYSTOSCOPY/URETEROSCOPY/HOLMIUM LASER/STENT PLACEMENT Right 11/14/2017   Procedure: CYSTOSCOPY/URETEROSCOPY/HOLMIUM LASER/STENT Exchange;  Surgeon: Abbie Sons, MD;  Location: [facility];  Service: Urology;  Laterality: Right;  stent exchange  . EYE SURGERY Right    cataract extraction.  needed yag laser and still has problems   . HAND SURGERY Right 1989   tendons came undone from bones in wrist. metal removed  . HERNIA REPAIR Bilateral 2010   inguinal hernias  . HERNIA REPAIR       reports that he has never smoked. He has never used smokeless tobacco. He reports that he does not drink alcohol and does not use drugs.  Allergies  Allergen Reactions  . Mirtazapine     Other reaction(s): Dizziness    Family History  Problem Relation Age of Onset  . Heart attack Father   . Stroke Mother      Prior  to Admission medications   Medication Sig Start Date End Date Taking? Authorizing Provider  amiodarone (PACERONE) 200 MG tablet TAKE 2 TABLETS BY MOUTH THREE TIMES DAILY FOR 2 DAYS THEN ONE TABLET ONCE EVERY DAY 04/06/19   [provider]  Apoaequorin (PREVAGEN) 10 MG CAPS Take 10 mg by mouth daily.    [provider]  aspirin 81 MG tablet Take 81 mg by mouth daily.    [provider]  aspirin-acetaminophen-caffeine (EXCEDRIN MIGRAINE) [phone] MG tablet Take 1 tablet by mouth daily as needed  for headache.    [provider]  azelastine (ASTELIN) 0.1 % nasal spray Place 1 spray into both nostrils at bedtime. Use in each nostril as directed    [provider]  BD VEO INSULIN SYRINGE U/F 31G X 15/64" 0.3 ML MISC USE TO INJECT INSULIN 6 TIMES DAILY 04/05/19   [provider]  CONTOUR NEXT TEST test strip TEST 3 TIMES A DAY AS DIRECTED 04/06/19   [provider]  Cyanocobalamin (B-12) 5000 MCG CAPS Take 5,000 mcg by mouth daily.    [provider]  ELIQUIS 5 MG TABS tablet Take 5 mg by mouth 2 (two) times daily. 04/29/19   [provider]  fluocinolone (SYNALAR) 0.01 % external solution fluocinolone 0.01 % topical solution    [provider]  gabapentin (NEURONTIN) 300 MG capsule Take 300 mg by mouth See admin instructions. Take 300 mg by mouth 2 times daily, may take a 3rd 300 mg dose as needed for pain 08/03/16   [provider]  glipiZIDE (GLUCOTROL) 5 MG tablet  05/17/19   [provider]  LEVEMIR 100 UNIT/ML injection SMARTSIG:10 Unit(s) SUB-Q Every 12 Hours 04/05/19   [provider]  levothyroxine (SYNTHROID) 75 MCG tablet Take by mouth. 01/01/19 01/01/20  [provider]  lisinopril (ZESTRIL) 5 MG tablet Take 5 mg by mouth daily. 04/02/19   [provider]  Magnesium 500 MG TABS Take 500 mg by mouth daily.    [provider]  meloxicam (MOBIC) 15 MG tablet Take 7.5 mg by mouth at bedtime.    [provider]  metFORMIN (GLUCOPHAGE) 500 MG tablet Take 500 mg by mouth 2 (two) times daily. 03/29/19   [provider]  metoprolol succinate (TOPROL-XL) 25 MG 24 hr tablet Take 12.5 mg by mouth at bedtime.  07/20/16   [provider]  NOVOLIN R 100 UNIT/ML injection PLEASE SEE ATTACHED FOR DETAILED DIRECTIONS 04/05/19   [provider]  omeprazole (PRILOSEC) 40 MG capsule Take 40 mg by mouth daily. 04/16/19   [provider]  ondansetron (ZOFRAN  ODT) 4 MG disintegrating tablet Take 1 tablet (4 mg total) by mouth every 8 (eight) hours as needed for nausea or vomiting. 11/04/17   Darel Hong, MD  OVER THE COUNTER MEDICATION Take 1 tablet by mouth daily. Nugenix otc supplement    [provider]  oxybutynin (DITROPAN) 5 MG tablet 1 tab tid prn frequency,urgency, bladder spasm 11/03/17   Stoioff, Ronda Fairly, MD  oxyCODONE-acetaminophen (PERCOCET/ROXICET) 5-325 MG tablet Take 1 tablet by mouth every 6 (six) hours as needed for severe pain. Patient not taking: Reported on 06/26/2019 11/03/17   Darel Hong, MD  pantoprazole (PROTONIX) 40 MG tablet pantoprazole 40 mg tablet,delayed release    [provider]  tamsulosin (FLOMAX) 0.4 MG CAPS capsule Take 1 capsule (0.4 mg total) by mouth daily. 11/03/17   Stoioff, Ronda Fairly, MD  triamcinolone ointment (KENALOG) 0.1 % APPLY  THIN FILM TO PSORIASIS SITES TWICE DAILY UNTIL FLAT THEN USE ONLY AS NEEDED 11/06/17   [provider]    Physical Exam: Vitals:   09/19/19 2030 09/19/19 2215 09/19/19 2230 09/19/19 2245  BP: (!) 162/113     Pulse: 81 71 65 70  Resp: (!) 25 13 (!) 25 14  Temp:      TempSrc:      SpO2: 97% 99% 98% 96%  Weight:      Height:          Constitutional: Acutely ill looking no distress Vitals:   09/19/19 2030 09/19/19 2215 09/19/19 2230 09/19/19 2245  BP: (!) 162/113     Pulse: 81 71 65 70  Resp: (!) 25 13 (!) 25 14  Temp:      TempSrc:      SpO2: 97% 99% 98% 96%  Weight:      Height:       Eyes: PERRL, lids and conjunctivae normal ENMT: Mucous membranes are dry. Posterior pharynx clear of any exudate or lesions.Normal dentition.  Neck: normal, supple, no masses, no thyromegaly Respiratory: Decreased air entry bilaterally with some rhonchi and crackles. Normal respiratory effort. No accessory muscle use.  Cardiovascular: Regular rate and rhythm, no murmurs / rubs / gallops. No extremity edema. 2+ pedal pulses. No carotid bruits.    Abdomen: no tenderness, no masses palpated. No hepatosplenomegaly. Bowel sounds positive.  Musculoskeletal: no clubbing / cyanosis. No joint deformity upper and lower extremities. Good ROM, no contractures. Normal muscle tone.  Skin: no rashes, lesions, ulcers. No induration Neurologic: CN 2-12 grossly intact. Sensation intact, DTR normal. Strength 5/5 in all 4.  Psychiatric: Normal judgment and insight. Alert and oriented x 3. Normal mood.     Labs on Admission: I have personally reviewed following labs and imaging studies  CBC: Recent Labs  Lab 09/19/19 1138  WBC 4.3  HGB 14.9  HCT 41.0  MCV 84.4  PLT 683*   Basic Metabolic Panel: Recent Labs  Lab 09/19/19 1343  NA 133*  K 4.6  CL 102  CO2 22  GLUCOSE 161*  BUN 25*  CREATININE 1.52*  CALCIUM 9.0   GFR: Estimated Creatinine Clearance: 45.7 mL/min (A) (by C-G formula based on SCr of 1.52 mg/dL (H)). Liver Function Tests: No results for input(s): AST, ALT, ALKPHOS, BILITOT, PROT, ALBUMIN in the last 168 hours. No results for input(s): LIPASE, AMYLASE in the last 168 hours. No results for input(s): AMMONIA in the last 168 hours. Coagulation Profile: No results for input(s): INR, PROTIME in the last 168 hours. Cardiac Enzymes: No results for input(s): CKTOTAL, CKMB, CKMBINDEX, TROPONINI in the last 168 hours. BNP (last 3 results) No results for input(s): PROBNP in the last 8760 hours. HbA1C: No results for input(s): HGBA1C in the last 72 hours. CBG: Recent Labs  Lab 09/19/19 1217  GLUCAP 152*   Lipid Profile: No results for input(s): CHOL, HDL, LDLCALC, TRIG, CHOLHDL, LDLDIRECT in the last 72 hours. Thyroid Function Tests: Recent Labs    09/19/19 1343  TSH 2.128   Anemia Panel: No results for input(s): VITAMINB12, FOLATE, FERRITIN, TIBC, IRON, RETICCTPCT in the last 72 hours. Urine analysis:    Component Value Date/Time   COLORURINE YELLOW (A) 09/19/2019 1051   APPEARANCEUR HAZY (A) 09/19/2019 1051    APPEARANCEUR Clear 06/26/2019 1040   LABSPEC 1.021 09/19/2019 1051   PHURINE 5.0 09/19/2019 1051   GLUCOSEU NEGATIVE 09/19/2019 1051   HGBUR SMALL (A) 09/19/2019 1051   BILIRUBINUR  NEGATIVE 09/19/2019 1051   BILIRUBINUR Negative 06/26/2019 1040   KETONESUR 20 (A) 09/19/2019 1051   PROTEINUR 30 (A) 09/19/2019 1051   NITRITE NEGATIVE 09/19/2019 1051   LEUKOCYTESUR NEGATIVE 09/19/2019 1051   Sepsis Labs: @LABRCNTIP (procalcitonin:4,lacticidven:4) ) Recent Results (from the past 240 hour(s))  SARS Coronavirus 2 by RT PCR (hospital order, performed in [facility] hospital lab) Nasopharyngeal Nasopharyngeal Swab     Status: Abnormal   Collection Time: 09/19/19 10:37 PM   Specimen: Nasopharyngeal Swab  Result Value Ref Range Status   SARS Coronavirus 2 POSITIVE (A) NEGATIVE Final    Comment: RESULT CALLED TO, READ BACK BY AND VERIFIED WITH: Virgil [id number] 09/19/19 HNM (NOTE) SARS-CoV-2 target nucleic acids are DETECTED  SARS-CoV-2 RNA is generally detectable in upper respiratory specimens  during the acute phase of infection.  Positive results are indicative  of the presence of the identified virus, but do not rule out bacterial infection or co-infection with other pathogens not detected by the test.  Clinical correlation with patient history and  other diagnostic information is necessary to determine patient infection status.  The expected result is negative.  Fact Sheet for Patients:   StrictlyIdeas.no   Fact Sheet for Healthcare Providers:   BankingDealers.co.za    This test is not yet approved or cleared by the [place] FDA and  has been authorized for detection and/or diagnosis of SARS-CoV-2 by FDA under an Emergency Use Authorization (EUA).  This EUA will remain in effect (meaning this test c an be used) for the duration of  the COVID-19 declaration under Section 564(b)(1) of the Act, 21 U.S.C. section 360-bbb-3(b)(1),  unless the authorization is terminated or revoked sooner.  Performed at [facility], [address] [place], [place] [address]      Radiological Exams on Admission: DG Chest Portable 1 View  Result Date: 09/19/2019 CLINICAL DATA:  Shortness of breath. EXAM: PORTABLE CHEST 1 VIEW COMPARISON:  Remote CT 02/17/2004 FINDINGS: Borderline hyperinflation. Mild central bronchial thickening. Subsegmental opacity at the right lung base. Heart is normal in size. There is mild aortic tortuosity. No pulmonary edema. No pneumothorax or pleural effusion. No evidence of pulmonary mass. No acute osseous abnormalities are seen. IMPRESSION: 1. Subsegmental right lung base opacity, favor atelectasis over pneumonia. 2. Borderline hyperinflation with mild bronchial thickening, may represent bronchitis, asthma, or smoking related lung disease. Recommend correlation for smoking history. Electronically Signed   By: Keith Rake M.D.   On: 09/19/2019 20:54    EKG: Independently reviewed.  Sinus rhythm no significant changes  Assessment/Plan Principal Problem:   Acute respiratory failure due to COVID-19 [facility]) Active Problems:   Acquired hypothyroidism   Benign essential hypertension   Peripheral neuropathy   History of atrial fibrillation   Hyperlipidemia, mixed   Stage 3b chronic kidney disease   Type 2 diabetes mellitus with stage 3b chronic kidney disease, without long-term current use of insulin ([place])     #1 COVID-19 pneumonia: Patient will be admitted.  Initiate COVID-19 protocol with remdesivir, Olumiant, dexamethasone, oxygen and other supportive care.  #2 acquired hypothyroidism: Hypothyroidism is chronic.  Continue with replacement therapy.  #3  Neuropathy: Continue close monitoring.  #4 essential hypertension: Resume home regimen  #5 diabetes: Sliding scale insulin.  Continue home regimen  #6 hyperlipidemia: Continue with statin  #7 chronic kidney disease stage IIIb: Appears  to be at baseline.  Continue monitoring   DVT prophylaxis: Lovenox Code Status: Full code Family Communication: No family at bedside Disposition Plan:  Home Consults called: None Admission status: Inpatient  Severity of Illness: The appropriate patient status for this patient is INPATIENT. Inpatient status is judged to be reasonable and necessary in order to provide the required intensity of service to ensure the patient's safety. The patient's presenting symptoms, physical exam findings, and initial radiographic and laboratory data in the context of their chronic comorbidities is felt to place them at high risk for further clinical deterioration. Furthermore, it is not anticipated that the patient will be medically stable for discharge from the hospital within 2 midnights of admission. The following factors support the patient status of inpatient.   " The patient's presenting symptoms include shortness of breath and cough. " The worrisome physical exam findings include coarse breath sound. " The initial radiographic and laboratory data are worrisome because of chest x-ray findings of pneumonia. " The chronic co-morbidities include hypertension and hyperlipidemia.   * I certify that at the point of admission it is my clinical judgment that the patient will require inpatient hospital care spanning beyond 2 midnights from the point of admission due to high intensity of service, high risk for further deterioration and high frequency of surveillance required.Barbette Merino MD [facility] Pager [phone]  If 7PM-7AM, please contact night-coverage [email or web address] Password [email or web address]  09/20/2019, 12:42 AM

## 2019-09-20 NOTE — ED Notes (Signed)
Pt states that he has been here due to lack of energy. Pt states he has had a cough and poor appetite. Pt states he thinks he is covid-19 positive, because it is the way the hospital to make money, because he was negative last week. Pt also states his blood sugar has been elevated at home.

## 2019-09-21 LAB — COMPREHENSIVE METABOLIC PANEL
ALT: 16 U/L (ref 0–44)
AST: 22 U/L (ref 15–41)
Albumin: 3.1 g/dL — ABNORMAL LOW (ref 3.5–5.0)
Alkaline Phosphatase: 67 U/L (ref 38–126)
Anion gap: 7 (ref 5–15)
BUN: 29 mg/dL — ABNORMAL HIGH (ref 8–23)
CO2: 23 mmol/L (ref 22–32)
Calcium: 8.9 mg/dL (ref 8.9–10.3)
Chloride: 102 mmol/L (ref 98–111)
Creatinine, Ser: 1.24 mg/dL (ref 0.61–1.24)
GFR calc Af Amer: >60 mL/min (ref >60)
GFR calc non Af Amer: 56 mL/min — ABNORMAL LOW (ref >60)
Glucose, Bld: 210 mg/dL — ABNORMAL HIGH (ref 70–99)
Potassium: 3.9 mmol/L (ref 3.5–5.1)
Sodium: 132 mmol/L — ABNORMAL LOW (ref 135–145)
Total Bilirubin: 0.8 mg/dL (ref 0.3–1.2)
Total Protein: 6.4 g/dL — ABNORMAL LOW (ref 6.5–8.1)

## 2019-09-21 LAB — CBC WITH DIFFERENTIAL/PLATELET
Abs Immature Granulocytes: 0.02 10*3/uL (ref 0.00–0.07)
Basophils Absolute: 0 10*3/uL (ref 0.0–0.1)
Basophils Relative: 0 %
Eosinophils Absolute: 0 10*3/uL (ref 0.0–0.5)
Eosinophils Relative: 0 %
HCT: 35.9 % — ABNORMAL LOW (ref 39.0–52.0)
Hemoglobin: 12.9 g/dL — ABNORMAL LOW (ref 13.0–17.0)
Immature Granulocytes: 0 %
Lymphocytes Relative: 25 %
Lymphs Abs: 1.6 10*3/uL (ref 0.7–4.0)
MCH: 30.4 pg (ref 26.0–34.0)
MCHC: 35.9 g/dL (ref 30.0–36.0)
MCV: 84.7 fL (ref 80.0–100.0)
Monocytes Absolute: 0.5 10*3/uL (ref 0.1–1.0)
Monocytes Relative: 7 %
Neutro Abs: 4.5 10*3/uL (ref 1.7–7.7)
Neutrophils Relative %: 68 %
Platelets: 141 10*3/uL — ABNORMAL LOW (ref 150–400)
RBC: 4.24 MIL/uL (ref 4.22–5.81)
RDW: 12.1 % (ref 11.5–15.5)
WBC: 6.6 10*3/uL (ref 4.0–10.5)
nRBC: 0 % (ref 0.0–0.2)

## 2019-09-21 LAB — GLUCOSE, CAPILLARY
Glucose-Capillary: 186 mg/dL — ABNORMAL HIGH (ref 70–99)
Glucose-Capillary: 245 mg/dL — ABNORMAL HIGH (ref 70–99)
Glucose-Capillary: 235 mg/dL — ABNORMAL HIGH (ref 70–99)
Glucose-Capillary: 227 mg/dL — ABNORMAL HIGH (ref 70–99)

## 2019-09-21 LAB — FERRITIN: Ferritin: 226 ng/mL (ref 24–336)

## 2019-09-21 LAB — C-REACTIVE PROTEIN: CRP: 1.4 mg/dL — ABNORMAL HIGH (ref <1.0)

## 2019-09-21 LAB — T4: T4, Total: 6 ug/dL (ref 4.5–12.0)

## 2019-09-21 LAB — FIBRIN DERIVATIVES D-DIMER (ARMC ONLY): Fibrin derivatives D-dimer (ARMC): 505.83 ng/mL (FEU) — ABNORMAL HIGH (ref 0.00–499.00)

## 2019-09-21 LAB — LACTATE DEHYDROGENASE: LDH: 176 U/L (ref 98–192)

## 2019-09-21 MED ORDER — SODIUM CHLORIDE 0.9 % IV SOLN
INTRAVENOUS | Status: DC | PRN
  Administered 2019-09-21 – 2019-09-22 (×2): 250 mL via INTRAVENOUS

## 2019-09-21 NOTE — Progress Notes (Signed)
PROGRESS NOTE    Kurt Hernandez  PJK:[id number] DOB: 1940/10/29 DOA: 09/19/2019 PCP: Rusty Aus, MD   Brief Narrative:  Kurt Hernandez is a 79 y.o. male with medical history significant of hypertension, atrial fibrillation, pernicious anemia and history of kidney stones who has been feeling weak seek for the last 3 months on and off.  In the last month he was tested for Covid was negative.  He continues to feel shortness of breath and cough.  He has subjective fevers.  Patient was not vaccinated against COVID-19.  He also has had UTI in the past that felt like this.  Patient came to the ER today was evaluated and found to be mildly hypoxic.  He desats to 91% on room air with activity but largely around 94%.  No fever no white count.  He is thrombocytopenic and appears to be dehydrated.  COVID-19 testing is now positive.  Patient being admitted with COVID-19 pneumonia as seen on his chest x-ray..  ED Course: Patient is afebrile.  Sodium 133 potassium 4.6 chloride 102 CO2 22 glucose 166 BUN 25 creatinine 1.52 and calcium 9.0.  Procalcitonin 1.76, TSH 2.128 COVID-19 is positive chest x-ray showed subsegmental right lung basal opacity consistent with pneumonia.  Also bronchial thickening.  He has platelets of 127.  Patient being admitted with COVID-19 pneumonia.  Assessment & Plan:   Principal Problem:   Acute respiratory failure due to COVID-19 [facility]) Active Problems:   Acquired hypothyroidism   Benign essential hypertension   Peripheral neuropathy   History of atrial fibrillation   Hyperlipidemia, mixed   Stage 3b chronic kidney disease   Type 2 diabetes mellitus with stage 3b chronic kidney disease, without long-term current use of insulin ([place])  COVID-19 pneumonia: Patient will be admitted.  Initiate COVID-19 protocol with remdesivir, Olumiant, dexamethasone, oxygen and other supportive care.we will cont with current regimen of 2mg  of barcitinab and decadron as pt is clinically  improving.   Acquired hypothyroidism: Hypothyroidism is chronic.    Will continue with home regimen of levothyroxine 75 mcg p.o. daily.  Neuropathy: Continue close monitoring.  Stable.  Continue Neurontin 300 mg 3 times daily.  As needed tramadol.  Essential hypertension: Resume home regimen,continue metoprolol 25 mg p.o. daily,   Diabetes Mellitus II: Sliding scale insulin.  Continue home regimen with Levemir 10 units every 12, sliding scale.  Hyperlipidemia: Continue with statin  Chronic kidney disease stage IIIb: Appears to be at baseline.  Continue monitoring. Current creatinine of 1.24 improved from admission creatinine of 1.52.  History of atrial fibrillation: Patient is continued on Eliquis, aspirin 81, metoprolol 25 mg, amiodarone not continued Per med rec done patient is not taking amiodarone.  DVT prophylaxis: Eliquis Code Status: Full code Family Communication: None at bedside Disposition Plan: Inpatient  Status is: Inpatient  Not inpatient appropriate, will call UM team and downgrade to OBS.   Dispo: The patient is from: Home              Anticipated d/c is to: Home              Anticipated d/c date is: 1 day              Patient currently is not medically stable to d/c.   Subjective: Patient admitted yesterday with ongoing cough fever.  Patient afebrile on arrival.  Patient admitted was found to have COVID-19 infection.  Started on antiviral regimen protocol for COVID-19.  Patient has been stable overnight and  has clinically improved.  He still requires oxygen.  Currently he is on the following setting: Pulse oximetry on room air is  SpO2: 98 % O2 Flow Rate (L/min): 2 L/min labs show improvement in creatinine to 1.24 thrombocytopenia stable not 141.  Objective: Vitals:   09/20/19 2000 09/20/19 2336 09/21/19 0421 09/21/19 0758  BP: (!) 160/95 (!) 151/87 (!) 151/85 (!) 161/84  Pulse: 61 64 60 66  Resp:  19 19 16   Temp: 98.7 F (37.1 C)  98.7 F (37.1 C)  98.5 F (36.9 C)  TempSrc: Axillary  Oral Oral  SpO2: 96% 99% 95% 98%  Weight:  80.5 kg    Height:  6\' 3"  (1.905 m)      Intake/Output Summary (Last 24 hours) at 09/21/2019 1154 Last data filed at 09/20/2019 1330 Gross per 24 hour  Intake --  Output 300 ml  Net -300 ml   Filed Weights   09/19/19 1137 09/20/19 2336  Weight: 79.4 kg 80.5 kg   Examination: Blood pressure (!) 161/84, pulse 66, temperature 98.5 F (36.9 C), temperature source Oral, resp. rate 16, height 6\' 3"  (1.905 m), weight 80.5 kg, SpO2 98 %. General exam: Appears calm and comfortable  Respiratory system: Clear to auscultation. Respiratory effort normal. Cardiovascular system: S1 & S2 heard, RRR. No JVD, murmurs, rubs, gallops or clicks. No pedal edema. Gastrointestinal system: Abdomen is nondistended, soft and nontender. No organomegaly or masses felt. Normal bowel sounds heard. Central nervous system: Alert and oriented. No focal neurological deficits. Extremities: Symmetric 5 x 5 power. Skin: No rashes, lesions or ulcers Psychiatry: Judgement and insight appear normal. Mood & affect appropriate.   Data Reviewed: I have personally reviewed following labs and imaging studies  I/O last 3 completed shifts: In: -  Out: 300 [Urine:300] No intake/output data recorded. Lab Results  Component Value Date   CREATININE 1.24 09/21/2019   CREATININE 1.24 09/20/2019   CREATININE 1.52 (H) 09/19/2019   CBC: Recent Labs  Lab 09/19/19 1138 09/20/19 0254 09/21/19 0524  WBC 4.3 3.4* 6.6  NEUTROABS  --   --  4.5  HGB 14.9 13.6 12.9*  HCT 41.0 36.4* 35.9*  MCV 84.4 83.1 84.7  PLT 127* 106* 983*   Basic Metabolic Panel: Recent Labs  Lab 09/19/19 1343 09/20/19 0254 09/21/19 0524  NA 133*  --  132*  K 4.6  --  3.9  CL 102  --  102  CO2 22  --  23  GLUCOSE 161*  --  210*  BUN 25*  --  29*  CREATININE 1.52* 1.24 1.24  CALCIUM 9.0  --  8.9   GFR: Estimated Creatinine Clearance: 56.8 mL/min (by C-G formula  based on SCr of 1.24 mg/dL). Liver Function Tests: Recent Labs  Lab 09/21/19 0524  AST 22  ALT 16  ALKPHOS 67  BILITOT 0.8  PROT 6.4*  ALBUMIN 3.1*   CBG: Recent Labs  Lab 09/20/19 0847 09/20/19 1145 09/20/19 1513 09/20/19 2244 09/21/19 0757  GLUCAP 251* 197* 182* 212* 186*   Thyroid Function Tests: Recent Labs    09/19/19 1343 09/20/19 0254  TSH 2.128  --   T4TOTAL  --  6.0   Anemia Panel: Recent Labs    09/21/19 0524  FERRITIN 226   Sepsis Labs: Recent Labs  Lab 09/19/19 1343  PROCALCITON 1.76    Recent Results (from the past 240 hour(s))  SARS Coronavirus 2 by RT PCR (hospital order, performed in [facility] hospital lab) Nasopharyngeal Nasopharyngeal Swab  Status: Abnormal   Collection Time: 09/19/19 10:37 PM   Specimen: Nasopharyngeal Swab  Result Value Ref Range Status   SARS Coronavirus 2 POSITIVE (A) NEGATIVE Final    Comment: RESULT CALLED TO, READ BACK BY AND VERIFIED WITH: [place] [id number] 09/19/19 HNM (NOTE) SARS-CoV-2 target nucleic acids are DETECTED  SARS-CoV-2 RNA is generally detectable in upper respiratory specimens  during the acute phase of infection.  Positive results are indicative  of the presence of the identified virus, but do not rule out bacterial infection or co-infection with other pathogens not detected by the test.  Clinical correlation with patient history and  other diagnostic information is necessary to determine patient infection status.  The expected result is negative.  Fact Sheet for Patients:   StrictlyIdeas.no   Fact Sheet for Healthcare Providers:   BankingDealers.co.za    This test is not yet approved or cleared by the [place] FDA and  has been authorized for detection and/or diagnosis of SARS-CoV-2 by FDA under an Emergency Use Authorization (EUA).  This EUA will remain in effect (meaning this test c an be used) for the duration of  the COVID-19  declaration under Section 564(b)(1) of the Act, 21 U.S.C. section 360-bbb-3(b)(1), unless the authorization is terminated or revoked sooner.  Performed at [facility], [address], [place], [place] [address]      Radiology Studies: DG Chest Portable 1 View  Result Date: 09/19/2019 CLINICAL DATA:  Shortness of breath. EXAM: PORTABLE CHEST 1 VIEW COMPARISON:  Remote CT 02/17/2004 FINDINGS: Borderline hyperinflation. Mild central bronchial thickening. Subsegmental opacity at the right lung base. Heart is normal in size. There is mild aortic tortuosity. No pulmonary edema. No pneumothorax or pleural effusion. No evidence of pulmonary mass. No acute osseous abnormalities are seen. IMPRESSION: 1. Subsegmental right lung base opacity, favor atelectasis over pneumonia. 2. Borderline hyperinflation with mild bronchial thickening, may represent bronchitis, asthma, or smoking related lung disease. Recommend correlation for smoking history. Electronically Signed   By: Keith Rake M.D.   On: 09/19/2019 20:54    Scheduled Meds: . apixaban  5 mg Oral BID  . vitamin C  500 mg Oral Daily  . azelastine  1 spray Each Nare BID  . baricitinib  2 mg Oral Daily  . dexamethasone (DECADRON) injection  6 mg Intravenous Q24H  . gabapentin  300 mg Oral TID  . insulin aspart  0-20 Units Subcutaneous TID WC  . insulin detemir  10 Units Subcutaneous BID  . Ipratropium-Albuterol  1 puff Inhalation Q6H  . levothyroxine  75 mcg Oral Daily  . metoprolol succinate  25 mg Oral Daily  . pantoprazole  40 mg Oral Daily  . zinc sulfate  220 mg Oral Daily   Continuous Infusions: . sodium chloride 250 mL (09/21/19 0858)  . remdesivir 100 mg in NS 100 mL 100 mg (09/21/19 0900)    LOS: 1 day   Para Skeans, MD [facility] Pager [phone] If 7PM-7AM, please contact night-coverage [email or web address] Password [facility] 09/21/2019, 11:54 AM

## 2019-09-22 LAB — TRANSFERRIN: Transferrin: 143 mg/dL — ABNORMAL LOW (ref 180–329)

## 2019-09-22 LAB — CBC WITH DIFFERENTIAL/PLATELET
Abs Immature Granulocytes: 0.02 10*3/uL (ref 0.00–0.07)
Basophils Absolute: 0 10*3/uL (ref 0.0–0.1)
Basophils Relative: 0 %
Eosinophils Absolute: 0 10*3/uL (ref 0.0–0.5)
Eosinophils Relative: 0 %
HCT: 34.1 % — ABNORMAL LOW (ref 39.0–52.0)
Hemoglobin: 12.6 g/dL — ABNORMAL LOW (ref 13.0–17.0)
Immature Granulocytes: 1 %
Lymphocytes Relative: 30 %
Lymphs Abs: 1.1 10*3/uL (ref 0.7–4.0)
MCH: 30.7 pg (ref 26.0–34.0)
MCHC: 37 g/dL — ABNORMAL HIGH (ref 30.0–36.0)
MCV: 83.2 fL (ref 80.0–100.0)
Monocytes Absolute: 0.4 10*3/uL (ref 0.1–1.0)
Monocytes Relative: 12 %
Neutro Abs: 2.1 10*3/uL (ref 1.7–7.7)
Neutrophils Relative %: 57 %
Platelets: 137 10*3/uL — ABNORMAL LOW (ref 150–400)
RBC: 4.1 MIL/uL — ABNORMAL LOW (ref 4.22–5.81)
RDW: 12 % (ref 11.5–15.5)
WBC: 3.6 10*3/uL — ABNORMAL LOW (ref 4.0–10.5)
nRBC: 0 % (ref 0.0–0.2)

## 2019-09-22 LAB — COMPREHENSIVE METABOLIC PANEL
ALT: 16 U/L (ref 0–44)
AST: 23 U/L (ref 15–41)
Albumin: 3.1 g/dL — ABNORMAL LOW (ref 3.5–5.0)
Alkaline Phosphatase: 69 U/L (ref 38–126)
Anion gap: 8 (ref 5–15)
BUN: 31 mg/dL — ABNORMAL HIGH (ref 8–23)
CO2: 22 mmol/L (ref 22–32)
Calcium: 9.4 mg/dL (ref 8.9–10.3)
Chloride: 102 mmol/L (ref 98–111)
Creatinine, Ser: 1.39 mg/dL — ABNORMAL HIGH (ref 0.61–1.24)
GFR calc Af Amer: 56 mL/min — ABNORMAL LOW (ref >60)
GFR calc non Af Amer: 49 mL/min — ABNORMAL LOW (ref >60)
Glucose, Bld: 275 mg/dL — ABNORMAL HIGH (ref 70–99)
Potassium: 5 mmol/L (ref 3.5–5.1)
Sodium: 132 mmol/L — ABNORMAL LOW (ref 135–145)
Total Bilirubin: 0.6 mg/dL (ref 0.3–1.2)
Total Protein: 6.4 g/dL — ABNORMAL LOW (ref 6.5–8.1)

## 2019-09-22 LAB — GLUCOSE, CAPILLARY
Glucose-Capillary: 233 mg/dL — ABNORMAL HIGH (ref 70–99)
Glucose-Capillary: 296 mg/dL — ABNORMAL HIGH (ref 70–99)

## 2019-09-22 LAB — FIBRIN DERIVATIVES D-DIMER (ARMC ONLY): Fibrin derivatives D-dimer (ARMC): 405.18 ng/mL (FEU) (ref 0.00–499.00)

## 2019-09-22 LAB — IRON AND TIBC
Iron: 102 ug/dL (ref 45–182)
Iron: 107 ug/dL (ref 45–182)
Saturation Ratios: 51 % — ABNORMAL HIGH (ref 17.9–39.5)
Saturation Ratios: 53 % — ABNORMAL HIGH (ref 17.9–39.5)
TIBC: 200 ug/dL — ABNORMAL LOW (ref 250–450)
TIBC: 200 ug/dL — ABNORMAL LOW (ref 250–450)
UIBC: 98 ug/dL
UIBC: 93 ug/dL

## 2019-09-22 LAB — FOLATE: Folate: 40 ng/mL (ref >5.9)

## 2019-09-22 LAB — RETICULOCYTES
Immature Retic Fract: 5.7 % (ref 2.3–15.9)
RBC.: 4.08 MIL/uL — ABNORMAL LOW (ref 4.22–5.81)
Retic Count, Absolute: 25.3 10*3/uL (ref 19.0–186.0)
Retic Ct Pct: 0.6 % (ref 0.4–3.1)

## 2019-09-22 LAB — VITAMIN B12: Vitamin B-12: 1169 pg/mL — ABNORMAL HIGH (ref 180–914)

## 2019-09-22 LAB — FERRITIN: Ferritin: 241 ng/mL (ref 24–336)

## 2019-09-22 LAB — C-REACTIVE PROTEIN: CRP: 1.1 mg/dL — ABNORMAL HIGH (ref <1.0)

## 2019-09-22 LAB — LACTATE DEHYDROGENASE: LDH: 158 U/L (ref 98–192)

## 2019-09-22 MED ORDER — ENSURE ENLIVE PO LIQD: 237.0000 mL | Freq: Two times a day (BID) | ORAL | 12 refills | Status: DC

## 2019-09-22 MED ORDER — ASCORBIC ACID 500 MG PO TABS: 500.0000 mg | ORAL_TABLET | Freq: Every day | ORAL | 0 refills | Status: AC

## 2019-09-22 MED ORDER — ZINC SULFATE 220 (50 ZN) MG PO CAPS: 220.0000 mg | ORAL_CAPSULE | Freq: Every day | ORAL | 0 refills | Status: AC

## 2019-09-22 MED ORDER — ENSURE ENLIVE PO LIQD
237.0000 mL | Freq: Two times a day (BID) | ORAL | Status: DC
  Administered 2019-09-22: 11:00:00 237 mL via ORAL

## 2019-09-22 MED ORDER — DEXAMETHASONE 6 MG PO TABS: 6.0000 mg | ORAL_TABLET | Freq: Two times a day (BID) | ORAL | 0 refills | Status: AC

## 2019-09-22 NOTE — TOC Transition Note (Signed)
Transition of Care [facility]) - CM/SW Discharge Note   Patient Details  Name: Kurt Hernandez MRN: [id number] Date of Birth: 11-11-1940  Transition of Care [facility]) CM/SW Contact:  Shelbie Hutching, RN Phone Number: 09/22/2019, 12:42 PM   Clinical Narrative:    Patient admitted to the hospital with [place].  Patient is tolerating room air and has been medically cleared for discharge home.  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  Patient is normally very active and healthy.  Patient does agree to home health services at discharge for RN, PT, and [place] with [facility] has accepted home health referral.   Patient's wife will pick him up at discharge today.  Patient has no equipment needs.    Final next level of care: [place] Barriers to Discharge: Barriers Resolved   Patient Goals and CMS Choice Patient states their goals for this hospitalization and ongoing recovery are:: to go home CMS Medicare.gov Compare Post Acute Care list provided to:: Patient Choice offered to / list presented to : Patient  Discharge Placement                       Discharge Plan and Services     Post Acute Care Choice: Home Health                    HH Arranged: RN, PT, OT [facility] Agency: [place] ([facility]) Date [facility] Agency Contacted: 09/22/19 Time HH Agency Contacted: [phone]    Social Determinants of Health (SDOH) Interventions     Readmission Risk Interventions No flowsheet data found.

## 2019-09-22 NOTE — Progress Notes (Signed)
Patient scheduled for outpatient Remdesivir infusions at [facility] on Monday 9/13, Tuesday 9/14 and Wednesday 9/15 at [facility]. Please inform the patient to park at [place], as staff will be escorting the patient through the [place] entrance of the hospital.    There is a wave flag banner located near the entrance on [address]. Turn into this entrance and immediately turn left and park in 1 of the 5 designated Covid Infusion Parking spots. There is a phone number on the sign, please call and let the staff know what spot you are in and we will come out and get you. For questions call [phone].  Thanks.

## 2019-09-22 NOTE — Discharge Summary (Addendum)
Physician Discharge Summary  Kurt Hernandez PTW:[id number] DOB: [birth year] DOA: 09/19/2019  PCP: Rusty Aus, MD  Admit date: 09/19/2019 Discharge date: 09/22/2019   Discharge Diagnoses:  Principal Problem:   Acute respiratory failure due to COVID-19 [facility]) Active Problems:   Acquired hypothyroidism   Benign essential hypertension   Peripheral neuropathy   History of atrial fibrillation   Hyperlipidemia, mixed   Stage 3b chronic kidney disease   Type 2 diabetes mellitus with stage 3b chronic kidney disease, without long-term current use of insulin ([place]) COVID-19 pneumonia:Patient will be admitted. Initiate COVID-19 protocol with remdesivir, Olumiant, dexamethasone, oxygen and other supportive care.we will cont with current regimen of 2mg  of barcitinab and decadron as pt is clinically improving. Pt is clinically doing well and we will discharge him today , pt has no need of supple,mental oxygen.   Acquired hypothyroidism:Hypothyroidism is chronic.   Will continue with home regimen of levothyroxine 75 mcg p.o. daily.  Neuropathy:Continue close monitoring.  Stable.  Continue Neurontin 300 mg 3 times daily.  As needed tramadol.  Essential hypertension:Resume home regimen,continue metoprolol 25 mg p.o. daily,   Diabetes Mellitus ZG:YFVCBSW scale insulin. Continue home regimen with Levemir 10 units every 12, sliding scale.  Hyperlipidemia:Continue with statin  Chronic kidney disease stage IIIb:Appears to be at baseline. Continue monitoring. Current creatinine of 1.24 improved from admission creatinine of 1.52.  History of atrial fibrillation: Patient is continued on Eliquis, aspirin 81, metoprolol 25 mg, amiodarone not continued Per med rec done patient is not taking amiodarone.  Discharge Condition: Good.  Diet recommendation:  Cardiac diet.   Filed Weights   09/19/19 1137 09/20/19 2336  Weight: 79.4 kg 80.5 kg    Discharge activity:  As  tolerated.   History of present illness:  Kurt Hernandez a 79 y.o.malewith medical history significant ofhypertension, atrial fibrillation, pernicious anemia and history of kidney stones who has been feeling weak seek for the last 3 months on and off. In the last month he was tested for Covid was negative. He continues to feel shortness of breath and cough. He has subjective fevers. Patient was not vaccinated against COVID-19. He also has had UTI in the past that felt like this. Patient came to the ER today was evaluated and found to be mildly hypoxic. He desats to 91% on room air with activity but largely around 94%. No fever no white count. He is thrombocytopenic and appears to be dehydrated. COVID-19 testing is now positive. Patient being admitted with COVID-19 pneumonia as seen on his chest x-ray..  ED Course:Patient is afebrile. Sodium 133 potassium 4.6 chloride 102 CO2 22 glucose 166 BUN 25 creatinine 1.52 and calcium 9.0. Procalcitonin 1.76, TSH 2.128 COVID-19 is positive chest x-ray showed subsegmental right lung basal opacity consistent with pneumonia. Also bronchial thickening. He has platelets of 127. Patient being admitted with COVID-19 pneumonia.  Hospital Course:  Pt admitted for covid-19 pneumonia and started on remdesivir and barcitininab and pt has had dramatic turnaround clinically. He is alert awake oriented and is short hospital stay pt has been stable and he feels clinically better.  His labs are stable he had been afebrile. Kidney function has remained stable as have his lft's.  We have done ambulating pulse oximetry and 6 minutes walk test completed.  Pt walked 3 laps around the unit (about 400 feet). O2 sats remained in the high 90's on RA, HR in the 70's. No signs of SOB observed.  Pt reports feeling much better and hope to  be discharge.  Based on 6 min walk test pt will be d/c home without supplemental  oxygen. Also pt will  need to complete remdesivir  infusion as outpatient  3 days.   Procedures: None Consultations: None  Discharge Exam: Vitals:   09/22/19 0753 09/22/19 1043  BP: 130/75 (!) 143/82  Pulse: (!) 48 64  Resp: 15 18  Temp: 97.8 F (36.6 C)   SpO2: 95% 95%   Physical Exam Vitals and nursing note reviewed.  Constitutional:      Appearance: He is normal weight.  HENT:     Head: Normocephalic.     Right Ear: External ear normal.     Left Ear: External ear normal.     Nose: Nose normal.     Mouth/Throat:     Pharynx: Oropharynx is clear.  Eyes:     Extraocular Movements: Extraocular movements intact.  Cardiovascular:     Rate and Rhythm: Normal rate and regular rhythm.     Pulses: Normal pulses.     Heart sounds: Normal heart sounds.  Pulmonary:     Effort: Pulmonary effort is normal.     Breath sounds: Normal breath sounds.  Abdominal:     General: Bowel sounds are normal.     Palpations: Abdomen is soft.  Musculoskeletal:        General: Normal range of motion.  Skin:    General: Skin is warm.  Neurological:     General: No focal deficit present.     Mental Status: He is alert and oriented to person, place, and time. Mental status is at baseline.  Psychiatric:        Mood and Affect: Mood normal.        Behavior: Behavior normal.    Discharge Instructions    Person Under Monitoring Name: Kurt Hernandez  Location: [place] [address]   Infection Prevention Recommendations for Individuals Confirmed to have, or Being Evaluated for, 2019 Novel Coronavirus (COVID-19) Infection Who Receive Care at Home  Individuals who are confirmed to have, or are being evaluated for, COVID-19 should follow the prevention steps below until a healthcare provider or local or state health department says they can return to normal activities.  Stay home except to get medical care You should restrict activities outside your home, except for getting medical care. Do not go to work,  school, or public areas, and do not use public transportation or taxis.  Call ahead before visiting your doctor Before your medical appointment, call the healthcare provider and tell them that you have, or are being evaluated for, COVID-19 infection. This will help the healthcare provider's office take steps to keep other people from getting infected. Ask your healthcare provider to call the local or state health department.  Monitor your symptoms Seek prompt medical attention if your illness is worsening (e.g., difficulty breathing). Before going to your medical appointment, call the healthcare provider and tell them that you have, or are being evaluated for, COVID-19 infection. Ask your healthcare provider to call the local or state health department.  Wear a facemask You should wear a facemask that covers your nose and mouth when you are in the same room with other people and when you visit a healthcare provider. People who live with or visit you should also wear a facemask while they are in the same room with you.  Separate yourself from other people in your home As much as possible, you should stay in a  different room from other people in your home. Also, you should use a separate bathroom, if available.  Avoid sharing household items You should not share dishes, drinking glasses, cups, eating utensils, towels, bedding, or other items with other people in your home. After using these items, you should wash them thoroughly with soap and water.  Cover your coughs and sneezes Cover your mouth and nose with a tissue when you cough or sneeze, or you can cough or sneeze into your sleeve. Throw used tissues in a lined trash can, and immediately wash your hands with soap and water for at least 20 seconds or use an alcohol-based hand rub.  Wash your [facility] your hands often and thoroughly with soap and water for at least 20 seconds. You can use an alcohol-based hand sanitizer if soap  and water are not available and if your hands are not visibly dirty. Avoid touching your eyes, nose, and mouth with unwashed hands.   Prevention Steps for Caregivers and Household Members of Individuals Confirmed to have, or Being Evaluated for, COVID-19 Infection Being Cared for in the Home  If you live with, or provide care at home for, a person confirmed to have, or being evaluated for, COVID-19 infection please follow these guidelines to prevent infection:  Follow healthcare provider's instructions Make sure that you understand and can help the patient follow any healthcare provider instructions for all care.  Provide for the patient's basic needs You should help the patient with basic needs in the home and provide support for getting groceries, prescriptions, and other personal needs.  Monitor the patient's symptoms If they are getting sicker, call his or her medical provider and tell them that the patient has, or is being evaluated for, COVID-19 infection. This will help the healthcare provider's office take steps to keep other people from getting infected. Ask the healthcare provider to call the local or state health department.  Limit the number of people who have contact with the patient  If possible, have only one caregiver for the patient.  Other household members should stay in another home or place of residence. If this is not possible, they should stay  in another room, or be separated from the patient as much as possible. Use a separate bathroom, if available.  Restrict visitors who do not have an essential need to be in the home.  Keep older adults, very young children, and other sick people away from the patient Keep older adults, very young children, and those who have compromised immune systems or chronic health conditions away from the patient. This includes people with chronic heart, lung, or kidney conditions, diabetes, and cancer.  Ensure good  ventilation Make sure that shared spaces in the home have good air flow, such as from an air conditioner or an opened window, weather permitting.  Wash your hands often  Wash your hands often and thoroughly with soap and water for at least 20 seconds. You can use an alcohol based hand sanitizer if soap and water are not available and if your hands are not visibly dirty.  Avoid touching your eyes, nose, and mouth with unwashed hands.  Use disposable paper towels to dry your hands. If not available, use dedicated cloth towels and replace them when they become wet.  Wear a facemask and gloves  Wear a disposable facemask at all times in the room and gloves when you touch or have contact with the patient's blood, body fluids, and/or secretions or excretions, such as  sweat, saliva, sputum, nasal mucus, vomit, urine, or feces.  Ensure the mask fits over your nose and mouth tightly, and do not touch it during use.  Throw out disposable facemasks and gloves after using them. Do not reuse.  Wash your hands immediately after removing your facemask and gloves.  If your personal clothing becomes contaminated, carefully remove clothing and launder. Wash your hands after handling contaminated clothing.  Place all used disposable facemasks, gloves, and other waste in a lined container before disposing them with other household waste.  Remove gloves and wash your hands immediately after handling these items.  Do not share dishes, glasses, or other household items with the patient  Avoid sharing household items. You should not share dishes, drinking glasses, cups, eating utensils, towels, bedding, or other items with a patient who is confirmed to have, or being evaluated for, COVID-19 infection.  After the person uses these items, you should wash them thoroughly with soap and water.  Wash laundry thoroughly  Immediately remove and wash clothes or bedding that have blood, body fluids, and/or  secretions or excretions, such as sweat, saliva, sputum, nasal mucus, vomit, urine, or feces, on them.  Wear gloves when handling laundry from the patient.  Read and follow directions on labels of laundry or clothing items and detergent. In general, wash and dry with the warmest temperatures recommended on the label.  Clean all areas the individual has used often  Clean all touchable surfaces, such as counters, tabletops, doorknobs, bathroom fixtures, toilets, phones, keyboards, tablets, and bedside tables, every day. Also, clean any surfaces that may have blood, body fluids, and/or secretions or excretions on them.  Wear gloves when cleaning surfaces the patient has come in contact with.  Use a diluted bleach solution (e.g., dilute bleach with 1 part bleach and 10 parts water) or a household disinfectant with a label that says EPA-registered for coronaviruses. To make a bleach solution at home, add 1 tablespoon of bleach to 1 quart (4 cups) of water. For a larger supply, add  cup of bleach to 1 gallon (16 cups) of water.  Read labels of cleaning products and follow recommendations provided on product labels. Labels contain instructions for safe and effective use of the cleaning product including precautions you should take when applying the product, such as wearing gloves or eye protection and making sure you have good ventilation during use of the product.  Remove gloves and wash hands immediately after cleaning.  Monitor yourself for signs and symptoms of illness Caregivers and household members are considered close contacts, should monitor their health, and will be asked to limit movement outside of the home to the extent possible. Follow the monitoring steps for close contacts listed on the symptom monitoring form.   ? If you have additional questions, contact your local health department or call the epidemiologist on call at [phone] (available 24/7). ? This guidance is subject  to change. For the most up-to-date guidance from [facility], please refer to their website: YouBlogs.pl Discharge Instructions    Call MD for:  difficulty breathing, headache or visual disturbances   Complete by: As directed    Call MD for:  extreme fatigue   Complete by: As directed    Call MD for:  hives   Complete by: As directed    Call MD for:  persistant dizziness or light-headedness   Complete by: As directed    Call MD for:  persistant nausea and vomiting   Complete by: As directed  Call MD for:  severe uncontrolled pain   Complete by: As directed    Call MD for:  temperature >100.4   Complete by: As directed    Diet - low sodium heart healthy   Complete by: As directed    Discharge instructions   Complete by: As directed    Please keep appt with Remdesivir clinic . Please keep all OP appt .   Increase activity slowly   Complete by: As directed    Increase activity slowly   Complete by: As directed    Increase activity slowly   Complete by: As directed      Allergies as of 09/22/2019      Reactions   Mirtazapine    Other reaction(s): Dizziness      Medication List    STOP taking these medications   amiodarone 200 MG tablet Commonly known as: PACERONE   aspirin 81 MG tablet   aspirin-acetaminophen-caffeine 250-250-65 MG tablet Commonly known as: EXCEDRIN MIGRAINE   B-12 5000 MCG Caps   fluocinolone 0.01 % external solution Commonly known as: SYNALAR   lisinopril 5 MG tablet Commonly known as: ZESTRIL   Magnesium 500 MG Tabs   metFORMIN 500 MG tablet Commonly known as: GLUCOPHAGE   NovoLIN R 100 units/mL injection Generic drug: insulin regular   nystatin cream Commonly known as: MYCOSTATIN   ondansetron 4 MG disintegrating tablet Commonly known as: Zofran ODT   OVER THE COUNTER MEDICATION   oxybutynin 5 MG tablet Commonly known as: DITROPAN   oxyCODONE-acetaminophen 5-325 MG  tablet Commonly known as: PERCOCET/ROXICET   pantoprazole 40 MG tablet Commonly known as: PROTONIX   tamsulosin 0.4 MG Caps capsule Commonly known as: FLOMAX   triamcinolone ointment 0.1 % Commonly known as: KENALOG     TAKE these medications   ascorbic acid 500 MG tablet Commonly known as: VITAMIN C Take 1 tablet (500 mg total) by mouth daily for 10 days. Start taking on: September 23, 2019   azelastine 0.1 % nasal spray Commonly known as: ASTELIN Place 1 spray into both nostrils 2 (two) times daily. Use in each nostril as directed   BD Veo Insulin Syringe U/F 31G X 15/64" 0.3 ML Misc Generic drug: Insulin Syringe-Needle U-100 USE TO INJECT INSULIN 6 TIMES DAILY   Contour Next Test test strip Generic drug: glucose blood TEST 3 TIMES A DAY AS DIRECTED   dexamethasone 6 MG tablet Commonly known as: DECADRON Take 1 tablet (6 mg total) by mouth 2 (two) times daily for 10 days.   Eliquis 5 MG Tabs tablet Generic drug: apixaban Take 5 mg by mouth 2 (two) times daily.   feeding supplement (ENSURE ENLIVE) Liqd Take 237 mLs by mouth 2 (two) times daily between meals.   gabapentin 300 MG capsule Commonly known as: NEURONTIN Take 300 mg by mouth 3 (three) times daily.   glipiZIDE 5 MG tablet Commonly known as: GLUCOTROL Take 5 mg by mouth 2 (two) times daily before a meal.   Levemir 100 UNIT/ML injection Generic drug: insulin detemir Inject 20 Units into the skin at bedtime.   levothyroxine 75 MCG tablet Commonly known as: SYNTHROID Take 75 mcg by mouth daily.   metoprolol succinate 25 MG 24 hr tablet Commonly known as: TOPROL-XL Take 25 mg by mouth daily.   omeprazole 40 MG capsule Commonly known as: PRILOSEC Take 40 mg by mouth daily.   Prevagen 10 MG Caps Generic drug: Apoaequorin Take 10 mg by mouth daily.   zinc sulfate  220 (50 Zn) MG capsule Take 1 capsule (220 mg total) by mouth daily for 10 days. Start taking on: September 23, 2019       Allergies  Allergen Reactions  . Mirtazapine     Other reaction(s): Dizziness    Follow-up Information    Rusty Aus, MD. Schedule an appointment as soon as possible for a visit in 1 week(s).   Specialty: Internal Medicine Why: Hospital followup appt.  Contact information: [place] [place] [address] [phone]                The results of significant diagnostics from this hospitalization (including imaging, microbiology, ancillary and laboratory) are listed below for reference.    Significant Diagnostic Studies: DG Chest Portable 1 View  Result Date: 09/19/2019 CLINICAL DATA:  Shortness of breath. EXAM: PORTABLE CHEST 1 VIEW COMPARISON:  Remote CT 02/17/2004 FINDINGS: Borderline hyperinflation. Mild central bronchial thickening. Subsegmental opacity at the right lung base. Heart is normal in size. There is mild aortic tortuosity. No pulmonary edema. No pneumothorax or pleural effusion. No evidence of pulmonary mass. No acute osseous abnormalities are seen. IMPRESSION: 1. Subsegmental right lung base opacity, favor atelectasis over pneumonia. 2. Borderline hyperinflation with mild bronchial thickening, may represent bronchitis, asthma, or smoking related lung disease. Recommend correlation for smoking history. Electronically Signed   By: Keith Rake M.D.   On: 09/19/2019 20:54    Microbiology: Recent Results (from the past 240 hour(s))  SARS Coronavirus 2 by RT PCR (hospital order, performed in [facility] hospital lab) Nasopharyngeal Nasopharyngeal Swab     Status: Abnormal   Collection Time: 09/19/19 10:37 PM   Specimen: Nasopharyngeal Swab  Result Value Ref Range Status   SARS Coronavirus 2 POSITIVE (A) NEGATIVE Final    Comment: RESULT CALLED TO, READ BACK BY AND VERIFIED WITH: [id number] 09/19/19 [facility] (NOTE) SARS-CoV-2 target nucleic acids are DETECTED  SARS-CoV-2 RNA is generally detectable in  upper respiratory specimens  during the acute phase of infection.  Positive results are indicative  of the presence of the identified virus, but do not rule out bacterial infection or co-infection with other pathogens not detected by the test.  Clinical correlation with patient history and  other diagnostic information is necessary to determine patient infection status.  The expected result is negative.  Fact Sheet for Patients:   StrictlyIdeas.no   Fact Sheet for Healthcare Providers:   BankingDealers.co.za    This test is not yet approved or cleared by the [place] FDA and  has been authorized for detection and/or diagnosis of SARS-CoV-2 by FDA under an Emergency Use Authorization (EUA).  This EUA will remain in effect (meaning this test c an be used) for the duration of  the COVID-19 declaration under Section 564(b)(1) of the Act, 21 U.S.C. section 360-bbb-3(b)(1), unless the authorization is terminated or revoked sooner.  Performed at [facility], [address] [place], [place] [address]      Labs: Basic Metabolic Panel: Recent Labs  Lab 09/19/19 1343 09/20/19 0254 09/21/19 0524 09/22/19 0515  NA 133*  --  132* 132*  K 4.6  --  3.9 5.0  CL 102  --  102 102  CO2 22  --  23 22  GLUCOSE 161*  --  210* 275*  BUN 25*  --  29* 31*  CREATININE 1.52* 1.24 1.24 1.39*  CALCIUM 9.0  --  8.9 9.4   Liver Function Tests: Recent  Labs  Lab 09/21/19 0524 09/22/19 0515  AST 22 23  ALT 16 16  ALKPHOS 67 69  BILITOT 0.8 0.6  PROT 6.4* 6.4*  ALBUMIN 3.1* 3.1*   No results for input(s): LIPASE, AMYLASE in the last 168 hours. No results for input(s): AMMONIA in the last 168 hours. CBC: Recent Labs  Lab 09/19/19 1138 09/20/19 0254 09/21/19 0524 09/22/19 0515  WBC 4.3 3.4* 6.6 3.6*  NEUTROABS  --   --  4.5 2.1  HGB 14.9 13.6 12.9* 12.6*  HCT 41.0 36.4* 35.9* 34.1*  MCV 84.4 83.1 84.7 83.2  PLT 127* 106*  141* 137*    CBG: Recent Labs  Lab 09/21/19 1217 09/21/19 1623 09/21/19 2110 09/22/19 0752 09/22/19 1137  GLUCAP 245* 235* 227* 233* 296*    Time spent: 20 minutes  Signed:  Para Skeans MD.  [facility] 09/22/2019, 2:42 PM

## 2019-09-22 NOTE — Progress Notes (Signed)
6 minutes walk test completed.  Pt walked 3 laps around the unit (about 400 feet). O2 sats remained in the high 90's on RA, HR in the 70's.  No signs of SOB observed.  Pt reports feeling much better and hope to be discharge.

## 2019-09-22 NOTE — Progress Notes (Signed)
Initial Nutrition Assessment  DOCUMENTATION CODES:   Not applicable  INTERVENTION:  Ensure Enlive po BID, each supplement provides 350 kcal and 20 grams of protein  Liberalize diet   NUTRITION DIAGNOSIS:   Increased nutrient needs related to catabolic illness (pneumonia due to COVID-19 virus) as evidenced by estimated needs.    GOAL:   Patient will meet greater than or equal to 90% of their needs    MONITOR:   Labs, Supplement acceptance, PO intake, Weight trends, I & O's  REASON FOR ASSESSMENT:   Malnutrition Screening Tool    ASSESSMENT:  RD working remotely.   79 year old male with history significant of HTN, Afib, pernicious anemia, HLD, HTN, CKD stage IIIb, DM2 and peripheral neuropathy who presented with intermittent weakness over the last 3 months, ongoing SOB with cough, and subjective fevers with prior negative Covid test on 9/01. Patient found to be mildly hypoxic, dehydrated  CXR consistent with pneumonia, and COVID-19 positive.  RD attempted to reach pt via phone, received busy signal x 2 and unable to obtain nutrition history at this time. Per flowsheets, he has eaten 75% x 1 documented meal.  Per chart, weights have trended down ~16 lbs (8.1%) in the last 3 months; significant. Patient reported intermittent weakness over this time frame, suspect decreased po intake as well given trends, degree of malnutrition likely, however unable to identify at this time. Patinet with history of DM2, no A1c for review. Would expect hyperglycemia given acute inflammatory state, which is worsened in pts with poor DM control prior to admission and IV steroids. Patient has significantly increased needs secondary to catabolic nature of Covid and currently on a restricted  HH/CM diet, however diet modifications to further reduce carbohydrates in COVID-19 pts is contraindicated. Will liberalize diet to regular and order Ensure to aid with meeting needs.   Medications reviewed and  include: Vit C, Decadron, Gabapentin, SSI, Levemir 10 units twice daily, Protonix, Zinc sulfate, Remdesivir  Labs: CBGs 233,227,235,245, Na 132 (L), BUN 31 (H), Cr 1.39 (H), WBC 3.6 (L) No A1c for review  NUTRITION - FOCUSED PHYSICAL EXAM: Unable to complete at this time, RD working remotely.   Diet Order:   Diet Order            Diet heart healthy/carb modified Room service appropriate? Yes; Fluid consistency: Thin  Diet effective now                 EDUCATION NEEDS:   Not appropriate for education at this time  Skin:  Skin Assessment: Reviewed RN Assessment  Last BM:  9/11  Height:   Ht Readings from Last 1 Encounters:  09/20/19 6\' 3"  (1.905 m)    Weight:   Wt Readings from Last 1 Encounters:  09/20/19 80.5 kg    Ideal Body Weight:  89.1 kg  BMI:  Body mass index is 22.18 kg/m.  Estimated Nutritional Needs:   Kcal:  8366-2947  Protein:  120-130  Fluid:  >/= 2L/day   Lajuan Lines, RD, LDN Clinical Nutrition After Hours/Weekend Pager # in [place]

## 2019-09-22 NOTE — Progress Notes (Signed)
Pt is being discharged home.  Discharge papers given and explained to pt.  Pt verbalized understanding.  Meds and f/u appointments reviewed.  Rx sent electronically to the pharmacy.  Pt made aware.  

## 2019-09-22 NOTE — Discharge Instructions (Addendum)
Patient scheduled for outpatient Remdesivir infusions at [facility] on Monday 9/13, Tuesday 9/14 and Wednesday 9/15 at [facility]. Please inform the patient to park at [address], as staff will be escorting the patient through the [place] entrance of the hospital.    There is a wave flag banner located near the entrance on [address]. Turn into this entrance and immediately turn left and park in 1 of the 5 designated Covid Infusion Parking spots. There is a phone number on the sign, please call and let the staff know what spot you are in and we will come out and get you. For questions call [phone].  Thanks.           Person Under Monitoring Name: Kurt Hernandez  Location: [place] [address]   Infection Prevention Recommendations for Individuals Confirmed to have, or Being Evaluated for, 2019 Novel Coronavirus (COVID-19) Infection Who Receive Care at Home  Individuals who are confirmed to have, or are being evaluated for, COVID-19 should follow the prevention steps below until a healthcare provider or local or state health department says they can return to normal activities.  Stay home except to get medical care You should restrict activities outside your home, except for getting medical care. Do not go to work, school, or public areas, and do not use public transportation or taxis.  Call ahead before visiting your doctor Before your medical appointment, call the healthcare provider and tell them that you have, or are being evaluated for, COVID-19 infection. This will help the healthcare provider's office take steps to keep other people from getting infected. Ask your healthcare provider to call the local or state health department.  Monitor your symptoms Seek prompt medical attention if your illness is worsening (e.g., difficulty breathing). Before going to your medical appointment, call the healthcare provider and tell them that  you have, or are being evaluated for, COVID-19 infection. Ask your healthcare provider to call the local or state health department.  Wear a facemask You should wear a facemask that covers your nose and mouth when you are in the same room with other people and when you visit a healthcare provider. People who live with or visit you should also wear a facemask while they are in the same room with you.  Separate yourself from other people in your home As much as possible, you should stay in a different room from other people in your home. Also, you should use a separate bathroom, if available.  Avoid sharing household items You should not share dishes, drinking glasses, cups, eating utensils, towels, bedding, or other items with other people in your home. After using these items, you should wash them thoroughly with soap and water.  Cover your coughs and sneezes Cover your mouth and nose with a tissue when you cough or sneeze, or you can cough or sneeze into your sleeve. Throw used tissues in a lined trash can, and immediately wash your hands with soap and water for at least 20 seconds or use an alcohol-based hand rub.  Wash your [facility] your hands often and thoroughly with soap and water for at least 20 seconds. You can use an alcohol-based hand sanitizer if soap and water are not available and if your hands are not visibly dirty. Avoid touching your eyes, nose, and mouth with unwashed hands.   Prevention Steps for Caregivers and Household Members of Individuals Confirmed to have, or Being Evaluated for,  COVID-19 Infection Being Cared for in the Home  If you live with, or provide care at home for, a person confirmed to have, or being evaluated for, COVID-19 infection please follow these guidelines to prevent infection:  Follow healthcare provider's instructions Make sure that you understand and can help the patient follow any healthcare provider instructions for all  care.  Provide for the patient's basic needs You should help the patient with basic needs in the home and provide support for getting groceries, prescriptions, and other personal needs.  Monitor the patient's symptoms If they are getting sicker, call his or her medical provider and tell them that the patient has, or is being evaluated for, COVID-19 infection. This will help the healthcare provider's office take steps to keep other people from getting infected. Ask the healthcare provider to call the local or state health department.  Limit the number of people who have contact with the patient  If possible, have only one caregiver for the patient.  Other household members should stay in another home or place of residence. If this is not possible, they should stay  in another room, or be separated from the patient as much as possible. Use a separate bathroom, if available.  Restrict visitors who do not have an essential need to be in the home.  Keep older adults, very young children, and other sick people away from the patient Keep older adults, very young children, and those who have compromised immune systems or chronic health conditions away from the patient. This includes people with chronic heart, lung, or kidney conditions, diabetes, and cancer.  Ensure good ventilation Make sure that shared spaces in the home have good air flow, such as from an air conditioner or an opened window, weather permitting.  Wash your hands often  Wash your hands often and thoroughly with soap and water for at least 20 seconds. You can use an alcohol based hand sanitizer if soap and water are not available and if your hands are not visibly dirty.  Avoid touching your eyes, nose, and mouth with unwashed hands.  Use disposable paper towels to dry your hands. If not available, use dedicated cloth towels and replace them when they become wet.  Wear a facemask and gloves  Wear a disposable facemask at  all times in the room and gloves when you touch or have contact with the patient's blood, body fluids, and/or secretions or excretions, such as sweat, saliva, sputum, nasal mucus, vomit, urine, or feces.  Ensure the mask fits over your nose and mouth tightly, and do not touch it during use.  Throw out disposable facemasks and gloves after using them. Do not reuse.  Wash your hands immediately after removing your facemask and gloves.  If your personal clothing becomes contaminated, carefully remove clothing and launder. Wash your hands after handling contaminated clothing.  Place all used disposable facemasks, gloves, and other waste in a lined container before disposing them with other household waste.  Remove gloves and wash your hands immediately after handling these items.  Do not share dishes, glasses, or other household items with the patient  Avoid sharing household items. You should not share dishes, drinking glasses, cups, eating utensils, towels, bedding, or other items with a patient who is confirmed to have, or being evaluated for, COVID-19 infection.  After the person uses these items, you should wash them thoroughly with soap and water.  Wash laundry thoroughly  Immediately remove and wash clothes or bedding that have blood,  body fluids, and/or secretions or excretions, such as sweat, saliva, sputum, nasal mucus, vomit, urine, or feces, on them.  Wear gloves when handling laundry from the patient.  Read and follow directions on labels of laundry or clothing items and detergent. In general, wash and dry with the warmest temperatures recommended on the label.  Clean all areas the individual has used often  Clean all touchable surfaces, such as counters, tabletops, doorknobs, bathroom fixtures, toilets, phones, keyboards, tablets, and bedside tables, every day. Also, clean any surfaces that may have blood, body fluids, and/or secretions or excretions on them.  Wear gloves when  cleaning surfaces the patient has come in contact with.  Use a diluted bleach solution (e.g., dilute bleach with 1 part bleach and 10 parts water) or a household disinfectant with a label that says EPA-registered for coronaviruses. To make a bleach solution at home, add 1 tablespoon of bleach to 1 quart (4 cups) of water. For a larger supply, add  cup of bleach to 1 gallon (16 cups) of water.  Read labels of cleaning products and follow recommendations provided on product labels. Labels contain instructions for safe and effective use of the cleaning product including precautions you should take when applying the product, such as wearing gloves or eye protection and making sure you have good ventilation during use of the product.  Remove gloves and wash hands immediately after cleaning.  Monitor yourself for signs and symptoms of illness Caregivers and household members are considered close contacts, should monitor their health, and will be asked to limit movement outside of the home to the extent possible. Follow the monitoring steps for close contacts listed on the symptom monitoring form.   ? If you have additional questions, contact your local health department or call the epidemiologist on call at [phone] (available 24/7). ? This guidance is subject to change. For the most up-to-date guidance from [facility], please refer to their website: [email or web address]

## 2019-09-23 ENCOUNTER — Ambulatory Visit (HOSPITAL_COMMUNITY)
Admit: 2019-09-23 | Discharge: 2019-09-23 | Disposition: A | Discharge status: Home / Self Care | Payer: Medicare Other | Source: Ambulatory Visit | Attending: Pulmonary Disease | Admitting: Pulmonary Disease

## 2019-09-23 DIAGNOSIS — U071 COVID-19: Secondary | ICD-10-CM | POA: Insufficient documentation

## 2019-09-23 DIAGNOSIS — J1289 Other viral pneumonia: Secondary | ICD-10-CM | POA: Diagnosis not present

## 2019-09-23 MED ORDER — ALBUTEROL SULFATE HFA 108 (90 BASE) MCG/ACT IN AERS: 2.0000 | INHALATION_SPRAY | Freq: Once | RESPIRATORY_TRACT | Status: DC | PRN

## 2019-09-23 MED ORDER — EPINEPHRINE 0.3 MG/0.3ML IJ SOAJ: 0.3000 mg | Freq: Once | INTRAMUSCULAR | Status: DC | PRN

## 2019-09-23 MED ORDER — DIPHENHYDRAMINE HCL 50 MG/ML IJ SOLN: 50.0000 mg | Freq: Once | INTRAMUSCULAR | Status: DC | PRN

## 2019-09-23 MED ORDER — FAMOTIDINE IN NACL 20-0.9 MG/50ML-% IV SOLN: 20.0000 mg | Freq: Once | INTRAVENOUS | Status: DC | PRN

## 2019-09-23 MED ORDER — SODIUM CHLORIDE 0.9 % IV SOLN: INTRAVENOUS | Status: DC | PRN

## 2019-09-23 MED ORDER — SODIUM CHLORIDE 0.9 % IV SOLN
100.0000 mg | Freq: Once | INTRAVENOUS | Status: AC
  Administered 2019-09-23: 100 mg via INTRAVENOUS
  Filled 2019-09-23: qty 20

## 2019-09-23 MED ORDER — METHYLPREDNISOLONE SODIUM SUCC 125 MG IJ SOLR: 125.0000 mg | Freq: Once | INTRAMUSCULAR | Status: DC | PRN

## 2019-09-23 MED ORDER — SODIUM CHLORIDE 0.9 % IV SOLN: 100.0000 mg | Freq: Once | INTRAVENOUS | Status: DC

## 2019-09-23 NOTE — Discharge Instructions (Signed)
10 Things You Can Do to Manage Your COVID-19 Symptoms at Home If you have possible or confirmed COVID-19: 1. Stay home from work and school. And stay away from other public places. If you must go out, avoid using any kind of public transportation, ridesharing, or taxis. 2. Monitor your symptoms carefully. If your symptoms get worse, call your healthcare provider immediately. 3. Get rest and stay hydrated. 4. If you have a medical appointment, call the healthcare provider ahead of time and tell them that you have or may have COVID-19. 5. For medical emergencies, call 911 and notify the dispatch personnel that you have or may have COVID-19. 6. Cover your cough and sneezes with a tissue or use the inside of your elbow. 7. Wash your hands often with soap and water for at least 20 seconds or clean your hands with an alcohol-based hand sanitizer that contains at least 60% alcohol. 8. As much as possible, stay in a specific room and away from other people in your home. Also, you should use a separate bathroom, if available. If you need to be around other people in or outside of the home, wear a mask. 9. Avoid sharing personal items with other people in your household, like dishes, towels, and bedding. 10. Clean all surfaces that are touched often, like counters, tabletops, and doorknobs. Use household cleaning sprays or wipes according to the label instructions. cdc.gov/coronavirus 07/11/2018 This information is not intended to replace advice given to you by your health care provider. Make sure you discuss any questions you have with your health care provider. Document Revised: 12/13/2018 Document Reviewed: 12/13/2018 Elsevier Patient Education  2020 Elsevier Inc.  

## 2019-09-23 NOTE — Progress Notes (Signed)
  Diagnosis: COVID-19  Physician: Dr Joya Gaskins  Procedure: [facility]fusion [facility] Med: remdesivir infusion - Provided patient with remdesivir fact sheet for patients, parents and caregivers prior to infusion.  Complications: No immediate complications noted.  Discharge: Discharged home   [facility] 09/23/2019

## 2019-09-24 ENCOUNTER — Ambulatory Visit (HOSPITAL_COMMUNITY)
Admit: 2019-09-24 | Discharge: 2019-09-24 | Disposition: A | Discharge status: Home / Self Care | Payer: Medicare Other | Attending: Pulmonary Disease | Admitting: Pulmonary Disease

## 2019-09-24 DIAGNOSIS — U071 COVID-19: Secondary | ICD-10-CM | POA: Insufficient documentation

## 2019-09-24 DIAGNOSIS — J1282 Pneumonia due to coronavirus disease 2019: Secondary | ICD-10-CM | POA: Insufficient documentation

## 2019-09-24 LAB — SOLUBLE TRANSFERRIN RECEPTOR: Transferrin Receptor: 20.5 nmol/L (ref 12.2–27.3)

## 2019-09-24 MED ORDER — ALBUTEROL SULFATE HFA 108 (90 BASE) MCG/ACT IN AERS: 2.0000 | INHALATION_SPRAY | Freq: Once | RESPIRATORY_TRACT | Status: DC | PRN

## 2019-09-24 MED ORDER — SODIUM CHLORIDE 0.9 % IV SOLN: INTRAVENOUS | Status: DC | PRN

## 2019-09-24 MED ORDER — FAMOTIDINE IN NACL 20-0.9 MG/50ML-% IV SOLN: 20.0000 mg | Freq: Once | INTRAVENOUS | Status: DC | PRN

## 2019-09-24 MED ORDER — DIPHENHYDRAMINE HCL 50 MG/ML IJ SOLN: 50.0000 mg | Freq: Once | INTRAMUSCULAR | Status: DC | PRN

## 2019-09-24 MED ORDER — METHYLPREDNISOLONE SODIUM SUCC 125 MG IJ SOLR: 125.0000 mg | Freq: Once | INTRAMUSCULAR | Status: DC | PRN

## 2019-09-24 MED ORDER — EPINEPHRINE 0.3 MG/0.3ML IJ SOAJ: 0.3000 mg | Freq: Once | INTRAMUSCULAR | Status: DC | PRN

## 2019-09-24 MED ORDER — SODIUM CHLORIDE 0.9 % IV SOLN
100.0000 mg | Freq: Once | INTRAVENOUS | Status: AC
  Administered 2019-09-24: 100 mg via INTRAVENOUS
  Filled 2019-09-24: qty 20

## 2019-09-24 NOTE — Discharge Instructions (Signed)
10 Things You Can Do to Manage Your COVID-19 Symptoms at Home If you have possible or confirmed COVID-19: 1. Stay home from work and school. And stay away from other public places. If you must go out, avoid using any kind of public transportation, ridesharing, or taxis. 2. Monitor your symptoms carefully. If your symptoms get worse, call your healthcare provider immediately. 3. Get rest and stay hydrated. 4. If you have a medical appointment, call the healthcare provider ahead of time and tell them that you have or may have COVID-19. 5. For medical emergencies, call 911 and notify the dispatch personnel that you have or may have COVID-19. 6. Cover your cough and sneezes with a tissue or use the inside of your elbow. 7. Wash your hands often with soap and water for at least 20 seconds or clean your hands with an alcohol-based hand sanitizer that contains at least 60% alcohol. 8. As much as possible, stay in a specific room and away from other people in your home. Also, you should use a separate bathroom, if available. If you need to be around other people in or outside of the home, wear a mask. 9. Avoid sharing personal items with other people in your household, like dishes, towels, and bedding. 10. Clean all surfaces that are touched often, like counters, tabletops, and doorknobs. Use household cleaning sprays or wipes according to the label instructions. cdc.gov/coronavirus 07/11/2018 This information is not intended to replace advice given to you by your health care provider. Make sure you discuss any questions you have with your health care provider. Document Revised: 12/13/2018 Document Reviewed: 12/13/2018 Elsevier Patient Education  2020 Elsevier Inc.  

## 2019-09-24 NOTE — Progress Notes (Signed)
  Diagnosis: COVID-19  Physician:Dr Joya Gaskins   Procedure: [facility] Infusion Clinic Med: remdesivir infusion - Provided patient with remdesivir fact sheet for patients, parents and caregivers prior to infusion.  Complications: No immediate complications noted.  Discharge: Discharged home   Kurt Hernandez 09/24/2019

## 2019-09-25 ENCOUNTER — Ambulatory Visit (HOSPITAL_COMMUNITY): Payer: Medicare Other

## 2019-09-26 ENCOUNTER — Ambulatory Visit
Admission: RE | Admit: 2019-09-26 | Discharge: 2019-09-26 | Disposition: A | Discharge status: Home / Self Care | Payer: Medicare Other | Source: Ambulatory Visit | Attending: Internal Medicine | Admitting: Internal Medicine

## 2019-09-26 ENCOUNTER — Other Ambulatory Visit: Payer: Self-pay | Admitting: Internal Medicine

## 2019-09-26 ENCOUNTER — Other Ambulatory Visit: Payer: Self-pay

## 2019-09-26 DIAGNOSIS — Z8616 Personal history of COVID-19: Secondary | ICD-10-CM

## 2019-09-26 DIAGNOSIS — I639 Cerebral infarction, unspecified: Secondary | ICD-10-CM | POA: Diagnosis present

## 2019-09-26 HISTORY — DX: Personal history of COVID-19: Z86.16

## 2019-10-03 DIAGNOSIS — Z8673 Personal history of transient ischemic attack (TIA), and cerebral infarction without residual deficits: Secondary | ICD-10-CM

## 2019-10-03 HISTORY — DX: Personal history of transient ischemic attack (TIA), and cerebral infarction without residual deficits: Z86.73

## 2019-12-17 IMAGING — DX DG ABDOMEN 1V
2 series · 2 of 2 positions shown · non-contrast
Comparison: CT 10/11/2017

CLINICAL DATA: Right flank pain after stent placement

EXAM:
ABDOMEN - 1 VIEW

[abdomen kub (1 of 2)]
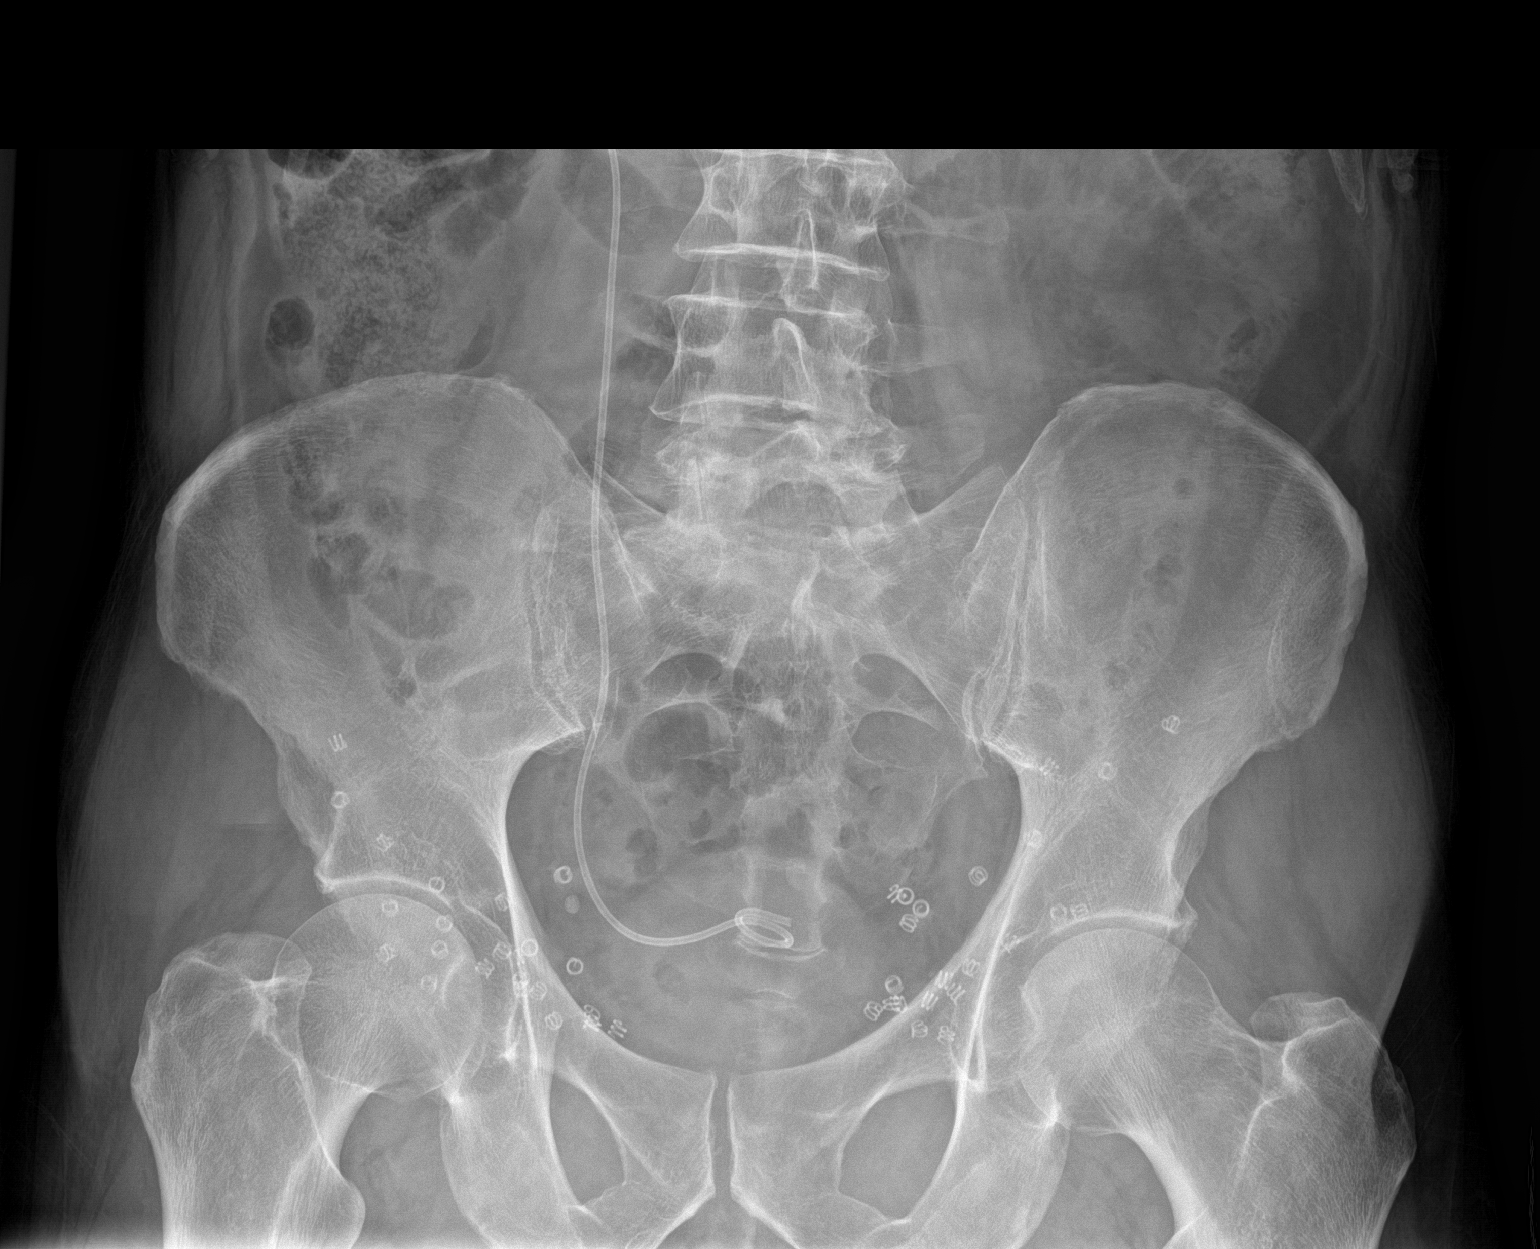

[abdomen kub (2 of 2)]
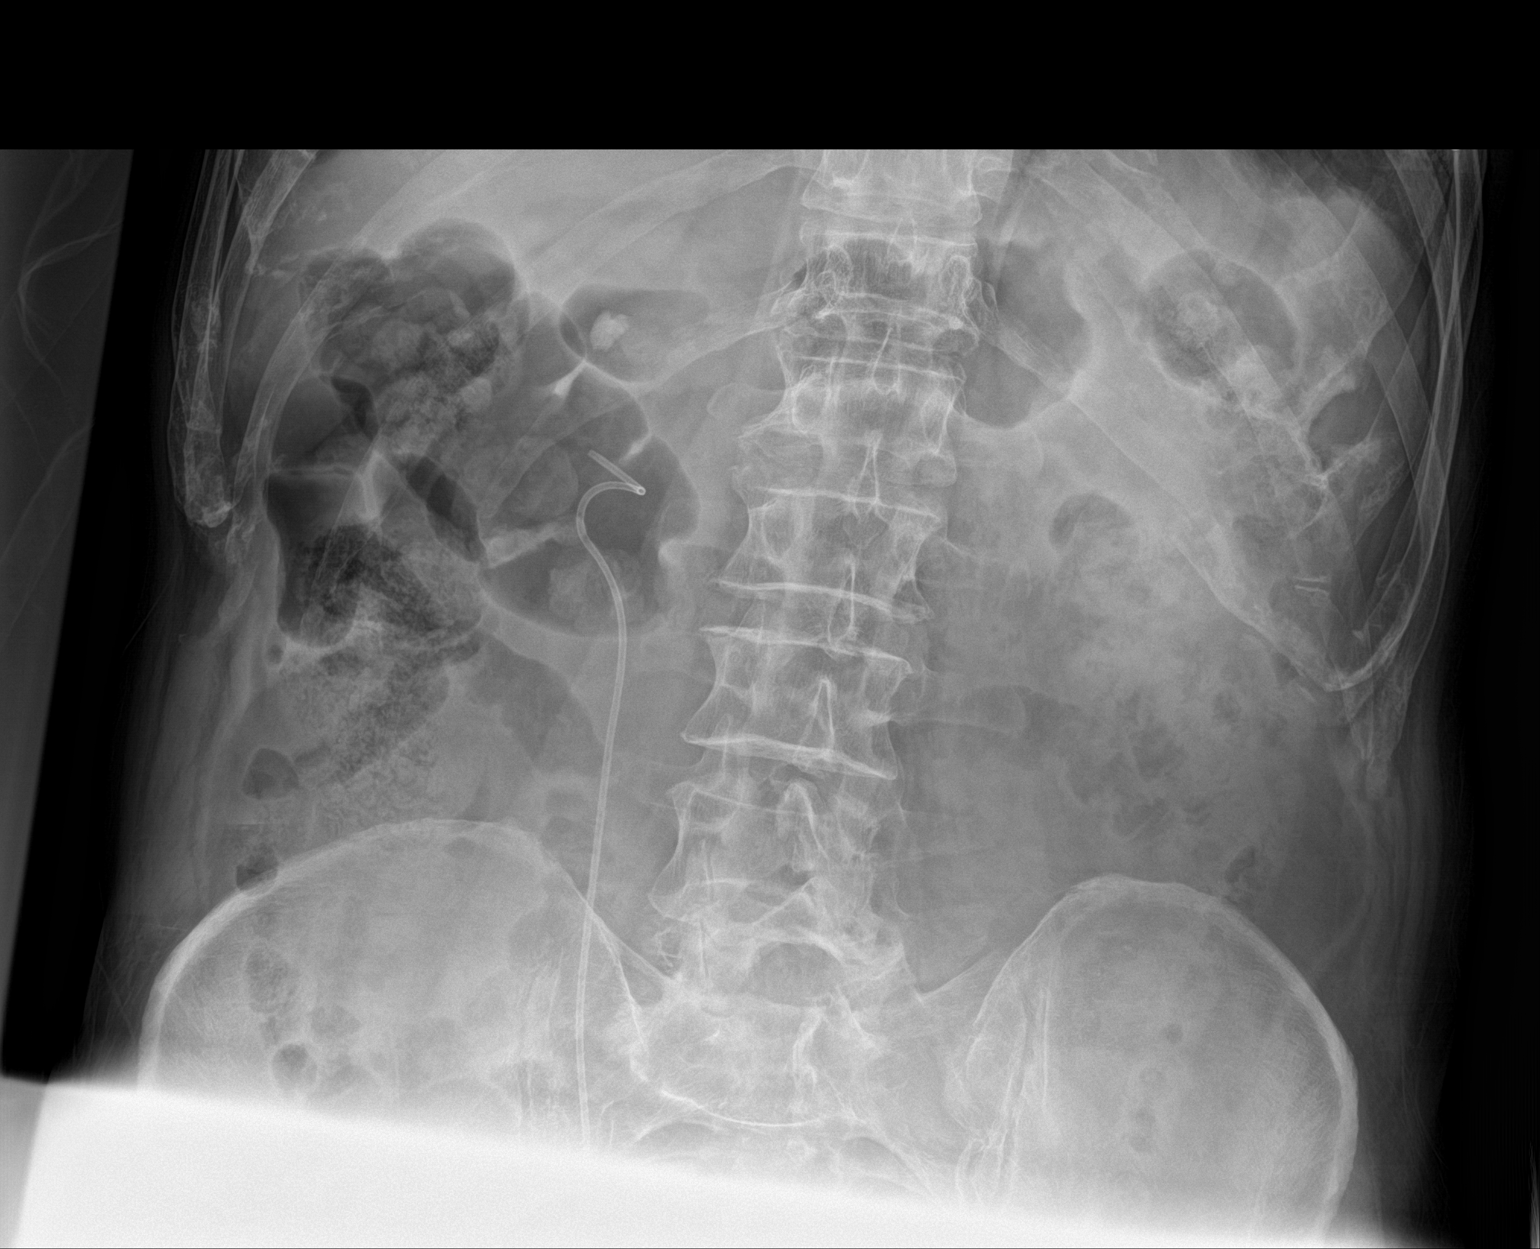

[2 of 2 positions shown; findings below may reference images not displayed]

FINDINGS: Nonobstructed bowel-gas pattern with moderate stool in the colon.
Right-sided ureteral stent. No stones along the course of the stent.
11 mm stone projecting over the upper right kidney. Previous hernia
repair at the pelvis.
IMPRESSION: Right-sided ureteral stent in place. 11 mm stone projecting over the
upper aspect of right kidney.

## 2020-06-25 ENCOUNTER — Ambulatory Visit: Payer: Self-pay | Admitting: Urology

## 2020-06-26 ENCOUNTER — Encounter: Payer: Self-pay | Admitting: Urology

## 2021-07-26 ENCOUNTER — Other Ambulatory Visit: Payer: Self-pay | Admitting: Internal Medicine

## 2021-07-26 ENCOUNTER — Emergency Department: Payer: Medicare Other

## 2021-07-26 ENCOUNTER — Emergency Department
Admission: RE | Admit: 2021-07-26 | Discharge: 2021-07-26 | Disposition: A | Discharge status: Home / Self Care | Payer: Medicare Other | Source: Ambulatory Visit | Attending: Internal Medicine | Admitting: Internal Medicine

## 2021-07-26 ENCOUNTER — Emergency Department
Admission: EM | Admit: 2021-07-26 | Discharge: 2021-07-26 | Discharge status: Against Medical Advice | Payer: Medicare Other | Attending: Emergency Medicine | Admitting: Emergency Medicine

## 2021-07-26 ENCOUNTER — Other Ambulatory Visit: Payer: Self-pay

## 2021-07-26 DIAGNOSIS — I639 Cerebral infarction, unspecified: Secondary | ICD-10-CM | POA: Insufficient documentation

## 2021-07-26 DIAGNOSIS — R29810 Facial weakness: Secondary | ICD-10-CM | POA: Diagnosis present

## 2021-07-26 DIAGNOSIS — Z5321 Procedure and treatment not carried out due to patient leaving prior to being seen by health care provider: Secondary | ICD-10-CM | POA: Diagnosis not present

## 2021-07-26 HISTORY — DX: Cerebral infarction, unspecified: I63.9

## 2021-07-26 HISTORY — DX: Type 2 diabetes mellitus without complications: E11.9

## 2021-07-26 LAB — COMPREHENSIVE METABOLIC PANEL
ALT: 15 U/L (ref 0–44)
AST: 18 U/L (ref 15–41)
Albumin: 3.2 g/dL — ABNORMAL LOW (ref 3.5–5.0)
Alkaline Phosphatase: 82 U/L (ref 38–126)
Anion gap: 4 — ABNORMAL LOW (ref 5–15)
BUN: 31 mg/dL — ABNORMAL HIGH (ref 8–23)
CO2: 25 mmol/L (ref 22–32)
Calcium: 9.5 mg/dL (ref 8.9–10.3)
Chloride: 107 mmol/L (ref 98–111)
Creatinine, Ser: 1.81 mg/dL — ABNORMAL HIGH (ref 0.61–1.24)
GFR, Estimated: 38 mL/min — ABNORMAL LOW (ref >60)
Glucose, Bld: 218 mg/dL — ABNORMAL HIGH (ref 70–99)
Potassium: 4.2 mmol/L (ref 3.5–5.1)
Sodium: 136 mmol/L (ref 135–145)
Total Bilirubin: 0.4 mg/dL (ref 0.3–1.2)
Total Protein: 7.1 g/dL (ref 6.5–8.1)

## 2021-07-26 LAB — CBC WITH DIFFERENTIAL/PLATELET
Abs Immature Granulocytes: 0.03 10*3/uL (ref 0.00–0.07)
Basophils Absolute: 0 10*3/uL (ref 0.0–0.1)
Basophils Relative: 1 %
Eosinophils Absolute: 0.3 10*3/uL (ref 0.0–0.5)
Eosinophils Relative: 4 %
HCT: 40.5 % (ref 39.0–52.0)
Hemoglobin: 13.7 g/dL (ref 13.0–17.0)
Immature Granulocytes: 0 %
Lymphocytes Relative: 24 %
Lymphs Abs: 1.9 10*3/uL (ref 0.7–4.0)
MCH: 30.6 pg (ref 26.0–34.0)
MCHC: 33.8 g/dL (ref 30.0–36.0)
MCV: 90.6 fL (ref 80.0–100.0)
Monocytes Absolute: 0.5 10*3/uL (ref 0.1–1.0)
Monocytes Relative: 7 %
Neutro Abs: 4.9 10*3/uL (ref 1.7–7.7)
Neutrophils Relative %: 64 %
Platelets: 212 10*3/uL (ref 150–400)
RBC: 4.47 MIL/uL (ref 4.22–5.81)
RDW: 12.2 % (ref 11.5–15.5)
WBC: 7.6 10*3/uL (ref 4.0–10.5)
nRBC: 0 % (ref 0.0–0.2)

## 2021-07-26 LAB — PROTIME-INR
INR: 1.2 (ref 0.8–1.2)
Prothrombin Time: 14.9 seconds (ref 11.4–15.2)

## 2021-07-26 NOTE — ED Provider Triage Note (Signed)
  Emergency Medicine Provider Triage Evaluation Note  Kurt Hernandez , a 81 y.o.male,  was evaluated in triage.  Pt complains of strokelike symptoms x3 days.  Reported left-sided facial droop, drooling, and difficulty closing his left eye that started midday on Friday.  He received a MRI today and was sent to the emergency department for evaluation of suspected stroke.  Patient states that overall he feels well.  Denies any pain.   Review of Systems  Positive: Left-sided facial droop Negative: Denies fever, chest pain, vomiting  Physical Exam   Vitals:   07/26/21 1757  BP: (!) 230/103  Pulse: (!) 48  Resp: 18  Temp: 97.6 F (36.4 C)  SpO2: 100%   Gen:   Awake, no distress   Resp:  Normal effort  MSK:   Moves extremities without difficulty  Other:  Left-sided facial droop and slurred speech.  No focal neurological deficits in the upper or lower extremities.  Medical Decision Making  Given the patient's initial medical screening exam, the following diagnostic evaluation has been ordered. The patient will be placed in the appropriate treatment space, once one is available, to complete the evaluation and treatment. I have discussed the plan of care with the patient and I have advised the patient that an ED physician or mid-level practitioner will reevaluate their condition after the test results have been received, as the results may give them additional insight into the type of treatment they may need.    Diagnostics: Labs, EKG, CXR  Treatments: none immediately   Teodoro Spray, [place] 07/26/21 1803

## 2021-07-26 NOTE — ED Triage Notes (Signed)
Pt sent from MRI with stroke. States he had left sided stroke on Friday around mid day, states the left facail droop has improved since Friday, states his left eye wouldn't close properly, was drooling. Now just a small droop to the lip. Denies any numbness, tingling. No drift noted, ambulatory with a steady gait.

## 2021-07-27 ENCOUNTER — Telehealth: Payer: Self-pay | Admitting: Medical Oncology

## 2021-09-14 ENCOUNTER — Emergency Department (HOSPITAL_COMMUNITY): Payer: Medicare Other

## 2021-09-14 ENCOUNTER — Encounter (HOSPITAL_COMMUNITY): Payer: Self-pay

## 2021-09-14 ENCOUNTER — Emergency Department (HOSPITAL_COMMUNITY)
Admission: EM | Admit: 2021-09-14 | Discharge: 2021-09-14 | Disposition: A | Discharge status: Home / Self Care | Payer: Medicare Other | Attending: Emergency Medicine | Admitting: Emergency Medicine

## 2021-09-14 ENCOUNTER — Other Ambulatory Visit: Payer: Self-pay

## 2021-09-14 DIAGNOSIS — R531 Weakness: Secondary | ICD-10-CM | POA: Diagnosis present

## 2021-09-14 DIAGNOSIS — Z7984 Long term (current) use of oral hypoglycemic drugs: Secondary | ICD-10-CM | POA: Diagnosis not present

## 2021-09-14 DIAGNOSIS — Z20822 Contact with and (suspected) exposure to covid-19: Secondary | ICD-10-CM | POA: Insufficient documentation

## 2021-09-14 DIAGNOSIS — N289 Disorder of kidney and ureter, unspecified: Secondary | ICD-10-CM | POA: Diagnosis not present

## 2021-09-14 DIAGNOSIS — Z794 Long term (current) use of insulin: Secondary | ICD-10-CM | POA: Diagnosis not present

## 2021-09-14 DIAGNOSIS — R42 Dizziness and giddiness: Secondary | ICD-10-CM

## 2021-09-14 DIAGNOSIS — Z8673 Personal history of transient ischemic attack (TIA), and cerebral infarction without residual deficits: Secondary | ICD-10-CM | POA: Insufficient documentation

## 2021-09-14 DIAGNOSIS — N281 Cyst of kidney, acquired: Secondary | ICD-10-CM | POA: Insufficient documentation

## 2021-09-14 DIAGNOSIS — R944 Abnormal results of kidney function studies: Secondary | ICD-10-CM | POA: Insufficient documentation

## 2021-09-14 DIAGNOSIS — I7121 Aneurysm of the ascending aorta, without rupture: Secondary | ICD-10-CM | POA: Diagnosis not present

## 2021-09-14 DIAGNOSIS — E119 Type 2 diabetes mellitus without complications: Secondary | ICD-10-CM | POA: Insufficient documentation

## 2021-09-14 DIAGNOSIS — Z7901 Long term (current) use of anticoagulants: Secondary | ICD-10-CM | POA: Insufficient documentation

## 2021-09-14 LAB — URINALYSIS, ROUTINE W REFLEX MICROSCOPIC
Bacteria, UA: NONE SEEN
Bilirubin Urine: NEGATIVE
Glucose, UA: 150 mg/dL — AB
Hgb urine dipstick: NEGATIVE
Ketones, ur: NEGATIVE mg/dL
Leukocytes,Ua: NEGATIVE
Nitrite: NEGATIVE
Protein, ur: 30 mg/dL — AB
Specific Gravity, Urine: 1.025 (ref 1.005–1.030)
pH: 5 (ref 5.0–8.0)

## 2021-09-14 LAB — CBC WITH DIFFERENTIAL/PLATELET
Abs Immature Granulocytes: 0.04 10*3/uL (ref 0.00–0.07)
Basophils Absolute: 0 10*3/uL (ref 0.0–0.1)
Basophils Relative: 0 %
Eosinophils Absolute: 0 10*3/uL (ref 0.0–0.5)
Eosinophils Relative: 0 %
HCT: 42.2 % (ref 39.0–52.0)
Hemoglobin: 15.1 g/dL (ref 13.0–17.0)
Immature Granulocytes: 0 %
Lymphocytes Relative: 11 %
Lymphs Abs: 1.2 10*3/uL (ref 0.7–4.0)
MCH: 31.1 pg (ref 26.0–34.0)
MCHC: 35.8 g/dL (ref 30.0–36.0)
MCV: 87 fL (ref 80.0–100.0)
Monocytes Absolute: 0.7 10*3/uL (ref 0.1–1.0)
Monocytes Relative: 6 %
Neutro Abs: 9.6 10*3/uL — ABNORMAL HIGH (ref 1.7–7.7)
Neutrophils Relative %: 83 %
Platelets: 197 10*3/uL (ref 150–400)
RBC: 4.85 MIL/uL (ref 4.22–5.81)
RDW: 11.9 % (ref 11.5–15.5)
WBC: 11.6 10*3/uL — ABNORMAL HIGH (ref 4.0–10.5)
nRBC: 0 % (ref 0.0–0.2)

## 2021-09-14 LAB — MAGNESIUM: Magnesium: 1.8 mg/dL (ref 1.7–2.4)

## 2021-09-14 LAB — BASIC METABOLIC PANEL
Anion gap: 9 (ref 5–15)
BUN: 30 mg/dL — ABNORMAL HIGH (ref 8–23)
CO2: 25 mmol/L (ref 22–32)
Calcium: 10.1 mg/dL (ref 8.9–10.3)
Chloride: 99 mmol/L (ref 98–111)
Creatinine, Ser: 1.94 mg/dL — ABNORMAL HIGH (ref 0.61–1.24)
GFR, Estimated: 35 mL/min — ABNORMAL LOW (ref >60)
Glucose, Bld: 345 mg/dL — ABNORMAL HIGH (ref 70–99)
Potassium: 4.2 mmol/L (ref 3.5–5.1)
Sodium: 133 mmol/L — ABNORMAL LOW (ref 135–145)

## 2021-09-14 LAB — TROPONIN I (HIGH SENSITIVITY)
Troponin I (High Sensitivity): 14 ng/L (ref <18)
Troponin I (High Sensitivity): 14 ng/L (ref <18)

## 2021-09-14 LAB — SARS CORONAVIRUS 2 BY RT PCR: SARS Coronavirus 2 by RT PCR: NEGATIVE

## 2021-09-14 MED ORDER — SODIUM CHLORIDE 0.9 % IV BOLUS
500.0000 mL | Freq: Once | INTRAVENOUS | Status: AC
  Administered 2021-09-14: 500 mL via INTRAVENOUS

## 2021-09-14 MED ORDER — MECLIZINE HCL 12.5 MG PO TABS: 12.5000 mg | ORAL_TABLET | Freq: Three times a day (TID) | ORAL | 0 refills | Status: DC | PRN

## 2021-09-14 MED ORDER — METOPROLOL SUCCINATE ER 25 MG PO TB24
25.0000 mg | ORAL_TABLET | Freq: Once | ORAL | Status: AC
  Administered 2021-09-14: 25 mg via ORAL
  Filled 2021-09-14: qty 1

## 2021-09-14 MED ORDER — LORAZEPAM 2 MG/ML IJ SOLN
1.0000 mg | Freq: Once | INTRAMUSCULAR | Status: AC
  Administered 2021-09-14: 1 mg via INTRAVENOUS
  Filled 2021-09-14: qty 1

## 2021-09-14 MED ORDER — MECLIZINE HCL 25 MG PO TABS
25.0000 mg | ORAL_TABLET | Freq: Once | ORAL | Status: AC
  Administered 2021-09-14: 25 mg via ORAL
  Filled 2021-09-14: qty 1

## 2021-09-14 NOTE — ED Notes (Signed)
Patient transported to MRI 

## 2021-09-14 NOTE — ED Provider Notes (Signed)
[facility] EMERGENCY DEPARTMENT Provider Note   CSN: [id number] Arrival date & time: 09/14/21  1532     History  Chief Complaint  Patient presents with   Weakness    Kurt Hernandez is a 81 y.o. male history of recent stroke, diabetes, here presenting with dizziness and weakness and trouble speaking and weight loss.  Patient was admitted for stroke on July 15.  Patient has been feeling very dizzy since then.  Patient states that over the last week or so, his symptoms got worse and he has trouble walking.  He also noticed worsening slurred speech.  Moreover patient has trouble swallowing and he states that he has lost at least 10 pounds over the last several weeks.  He also has been urinating frequently has nonproductive cough as well.  Patient went to see his primary care doctor and sent in for further evaluation  The history is provided by the patient.       Home Medications Prior to Admission medications   Medication Sig Start Date End Date Taking? Authorizing Provider  Apoaequorin (PREVAGEN) 10 MG CAPS Take 10 mg by mouth daily. Patient not taking: Reported on 09/20/2019    [provider]  azelastine (ASTELIN) 0.1 % nasal spray Place 1 spray into both nostrils 2 (two) times daily. Use in each nostril as directed     [provider]  BD VEO INSULIN SYRINGE U/F 31G X 15/64" 0.3 ML MISC USE TO INJECT INSULIN 6 TIMES DAILY 04/05/19   [provider]  CONTOUR NEXT TEST test strip TEST 3 TIMES A DAY AS DIRECTED 04/06/19   [provider]  ELIQUIS 5 MG TABS tablet Take 5 mg by mouth 2 (two) times daily. 04/29/19   [provider]  feeding supplement, ENSURE ENLIVE, (ENSURE ENLIVE) LIQD Take 237 mLs by mouth 2 (two) times daily between meals. 09/22/19   Para Skeans, MD  gabapentin (NEURONTIN) 300 MG capsule Take 300 mg by mouth 3 (three) times daily.  08/03/16   [provider]  glipiZIDE (GLUCOTROL) 5 MG tablet Take 5  mg by mouth 2 (two) times daily before a meal.  05/17/19   [provider]  LEVEMIR 100 UNIT/ML injection Inject 20 Units into the skin at bedtime.  04/05/19   [provider]  levothyroxine (SYNTHROID) 75 MCG tablet Take 75 mcg by mouth daily.  01/01/19 01/01/20  [provider]  metoprolol succinate (TOPROL-XL) 25 MG 24 hr tablet Take 25 mg by mouth daily.  07/20/16   [provider]  omeprazole (PRILOSEC) 40 MG capsule Take 40 mg by mouth daily. 04/16/19   [provider]      Allergies    Mirtazapine    Review of Systems   Review of Systems  Neurological:  Positive for speech difficulty and weakness.  All other systems reviewed and are negative.   Physical Exam Updated Vital Signs BP (!) 205/91   Pulse (!) 50   Temp 98 F (36.7 C) (Oral)   Resp 18   Ht '6\' 2"'$  (1.88 m)   Wt 76.2 kg   SpO2 97%   BMI 21.57 kg/m  Physical Exam Vitals and nursing note reviewed.  Constitutional:      Comments: Chronically ill  HENT:     Head: Normocephalic.     Nose: Nose normal.     Mouth/Throat:     Mouth: Mucous membranes are dry.  Eyes:     Extraocular Movements: Extraocular  movements intact.     Pupils: Pupils are equal, round, and reactive to light.  Cardiovascular:     Rate and Rhythm: Normal rate and regular rhythm.     Pulses: Normal pulses.     Heart sounds: Normal heart sounds.  Pulmonary:     Effort: Pulmonary effort is normal.     Breath sounds: Normal breath sounds.  Abdominal:     General: Abdomen is flat.     Palpations: Abdomen is soft.  Musculoskeletal:        General: Normal range of motion.     Cervical back: Normal range of motion and neck supple.  Skin:    General: Skin is warm.  Neurological:     Comments: Patient has left facial droop.  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  Patient has abnormal finger-to-nose on the left side but normal finger-to-nose on the right side.  Patient does have normal strength and  sensation bilateral arms and legs.  Patient has wide-based gait and needed 2 people assist to walk     ED Results / Procedures / Treatments   Labs (all labs ordered are listed, but only abnormal results are displayed) Labs Reviewed  CBC WITH DIFFERENTIAL/PLATELET - Abnormal; Notable for the following components:      Result Value   WBC 11.6 (*)    Neutro Abs 9.6 (*)    All other components within normal limits  BASIC METABOLIC PANEL - Abnormal; Notable for the following components:   Sodium 133 (*)    Glucose, Bld 345 (*)    BUN 30 (*)    Creatinine, Ser 1.94 (*)    GFR, Estimated 35 (*)    All other components within normal limits  URINALYSIS, ROUTINE W REFLEX MICROSCOPIC - Abnormal; Notable for the following components:   APPearance HAZY (*)    Glucose, UA 150 (*)    Protein, ur 30 (*)    All other components within normal limits  MAGNESIUM  TROPONIN I (HIGH SENSITIVITY)  TROPONIN I (HIGH SENSITIVITY)    EKG EKG Interpretation  Date/Time:  Tuesday September 14 2021 16:13:02 EDT Ventricular Rate:  95 PR Interval:  194 QRS Duration: 80 QT Interval:  368 QTC Calculation: 462 R Axis:   75 Text Interpretation: Sinus rhythm with Premature atrial complexes with Abberant conduction Minimal voltage criteria for LVH, may be normal variant ( Sokolow-Lyon ) Nonspecific ST abnormality Abnormal ECG When compared with ECG of 26-Jul-2021 18:05, PVC new since previous Confirmed by Wandra Arthurs [phone]) on 09/14/2021 6:29:53 PM  Radiology CT Head Wo Contrast  Result Date: 09/14/2021 CLINICAL DATA:  Dizziness EXAM: CT HEAD WITHOUT CONTRAST TECHNIQUE: Contiguous axial images were obtained from the base of the skull through the vertex without intravenous contrast. RADIATION DOSE REDUCTION: This exam was performed according to the departmental dose-optimization program which includes automated exposure control, adjustment of the mA and/or kV according to patient size and/or use of iterative  reconstruction technique. COMPARISON:  CT done on 09/26/2019 and MR brain done on 07/26/2021 FINDINGS: Brain: No acute intracranial findings are seen. There are no signs of bleeding within the cranium. There are low-density foci in right basal ganglia and right corona radiata suggesting old infarcts. There is decreased density in periventricular white matter. Cortical sulci are prominent. Vascular: Unremarkable. Skull: Unremarkable. Sinuses/Orbits: Unremarkable. Other: None. IMPRESSION: No acute intracranial findings are seen. 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 Atrophy. Small-vessel disease. Electronically Signed   By: Prudy Feeler.D.  On: 09/14/2021 17:49   DG Chest 2 View  Result Date: 09/14/2021 CLINICAL DATA:  Weakness, dizziness, lightheaded. EXAM: CHEST - 2 VIEW COMPARISON:  07/26/2021 FINDINGS: Mild hyperinflation. Heart is normal in size with stable mediastinal contours. Aortic atherosclerosis. There are streaky opacities at the right lung base, new from prior exam. No confluent consolidation. No pulmonary edema, pleural effusion, or pneumothorax. Stable osseous structures. IMPRESSION: 1. Streaky right lung base opacities likely representing atelectasis. 2. Mild unchanged hyperinflation. Electronically Signed   By: Keith Rake M.D.   On: 09/14/2021 17:01    Procedures Procedures    Medications Ordered in ED Medications  LORazepam (ATIVAN) injection 1 mg (0 mg Intravenous Hold 09/14/21 1905)  sodium chloride 0.9 % bolus 500 mL (500 mLs Intravenous New Bag/Given 09/14/21 1857)  meclizine (ANTIVERT) tablet 25 mg (25 mg Oral Given 09/14/21 1857)    ED Course/ Medical Decision Making/ A&P                           Medical Decision Making IMAAD REUSS is a 81 y.o. male here presenting with dizziness and trouble speaking and weight loss.  I am concerned for possible new stroke.  Also consider malignancy such as lung or pancreatic cancer.  Plan to get CBC and CMP and  MRI brain and CT chest abdomen pelvis.  We will hydrate patient and give meclizine and reassess  10:36 PM I reviewed patient's labs and independently interpreted imaging studies.  Patient has creatinine of 1.9 which is slightly elevated compared to 1.8 a month ago.  Troponin is negative x2.  MRI did not show any acute stroke.  CT chest abdomen pelvis showed incidental thoracic aneurysm and hepatic and renal cysts.  Patient felt better after meclizine.  At this point, I do not think patient requires admission.  I think patient can get vestibular rehab outpatient.  We will also refer to neurology outpatient  Problems Addressed: Aneurysm of ascending aorta without rupture [facility]): acute illness or injury Dizziness and giddiness: acute illness or injury Renal cyst: acute illness or injury Renal insufficiency: acute illness or injury  Amount and/or Complexity of Data Reviewed Labs: ordered. Decision-making details documented in ED Course. Radiology: ordered and independent interpretation performed. Decision-making details documented in ED Course. ECG/medicine tests: ordered and independent interpretation performed. Decision-making details documented in ED Course.  Risk Prescription drug management.    Final Clinical Impression(s) / ED Diagnoses Final diagnoses:  None    Rx / DC Orders ED Discharge Orders     None         Drenda Freeze, MD 09/14/21 2247

## 2021-09-14 NOTE — ED Notes (Signed)
Provider reports to give home metoprolol and d/c

## 2021-09-14 NOTE — Discharge Instructions (Addendum)
You have dizziness likely from your recent stroke.  I recommend you take meclizine as needed for dizziness.  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

## 2021-09-14 NOTE — ED Notes (Signed)
Patient transported to CT 

## 2021-09-14 NOTE — ED Provider Triage Note (Signed)
Emergency Medicine Provider Triage Evaluation Note  Kurt Hernandez , a 81 y.o. male  was evaluated in triage.  Pt complains of about 2 weeks of weakness, fatigue, dizziness, lightheadedness, ataxia.  Patient was recently seen in the ER in July diagnosed with a lacunar infarct.  Patient reports that his symptoms have worsened over the past 1 to 2 weeks.  Patient was seen by PCP where he had his insulin changed however symptoms did not improve.  They were concern for CVA versus MI sent patient to the ER for further evaluation.  Patient reports weight loss.  Denies any diarrhea.  Reports some nausea and vomiting.  No abdominal pain.  No fevers or chills.  Denies chest pain or shortness of breath.  Review of Systems  Positive: Ataxia, weakness, fatigue, dizziness, lightheadedness Negative: Fever, chills, shortness of breath, abdominal pain  Physical Exam  BP (!) 183/107   Pulse 73   Temp 98 F (36.7 C) (Oral)   Resp 18   Ht '6\' 2"'$  (1.88 m)   Wt 76.2 kg   SpO2 99%   BMI 21.57 kg/m  Gen:   Awake, no distress   Resp:  Normal effort  MSK:   Moves extremities without difficulty  Other:  The patient is alert, attentive, and oriented x 3. Speech is clear. Cranial nerve II-VII grossly intact. Negative pronator drift. Sensation intact. Strength 5/5 in all extremities.  Patient is off with finger-to-nose.     Medical Decision Making  Medically screening exam initiated at 4:30 PM.  Appropriate orders placed.  Yitzhak Awan Horger was informed that the remainder of the evaluation will be completed by another provider, this initial triage assessment does not replace that evaluation, and the importance of remaining in the ED until their evaluation is complete.  Patient reports 1 to 2 weeks of weakness, fatigue, ataxia.  History of lacunar infarct in July of this year.  Patient vital signs are reassuring.  On exam patient does have some ataxia with finger-to-nose.  Otherwise no focal neurological deficit  appreciated.  Given the chronicity of the events patient is not a candidate for any acute emergent intervention at this time.  Will need stroke work-up along with cardiac work-up as well.  Labs and imaging are pending at this time.  EKG was reviewed that shows sinus rhythm with PACs patient has no evidence of acute ischemic changes.  Patient heart rate is 95 bpm.   Doristine Devoid, PA-C 09/14/21 1633

## 2021-09-14 NOTE — ED Triage Notes (Signed)
Patient was seen last Wednesday and meds were changed.  Patient has had speech change since Thursday.  Recently had stroke in July.  Patient complains of increase weakness and everything wearing him out.  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  Slow to comprehend and having dizziness and gait changes.  Patient has hx of neuropathy in feet.  Patient had MRI a few weeks ago that showed 5 strokes per family.  Patient has cough and frequuent urination.

## 2021-10-14 ENCOUNTER — Encounter: Payer: Self-pay | Admitting: Neurology

## 2021-10-14 ENCOUNTER — Ambulatory Visit (INDEPENDENT_AMBULATORY_CARE_PROVIDER_SITE_OTHER): Payer: Medicare Other | Admitting: Neurology

## 2021-10-14 VITALS — BP 182/111 | HR 78 | Ht 73.5 in | Wt 167.5 lb

## 2021-10-14 DIAGNOSIS — R29810 Facial weakness: Secondary | ICD-10-CM | POA: Diagnosis not present

## 2021-10-14 DIAGNOSIS — E1143 Type 2 diabetes mellitus with diabetic autonomic (poly)neuropathy: Secondary | ICD-10-CM

## 2021-10-14 DIAGNOSIS — I6381 Other cerebral infarction due to occlusion or stenosis of small artery: Secondary | ICD-10-CM

## 2021-10-14 MED ORDER — ROSUVASTATIN CALCIUM 10 MG PO TABS: 10.0000 mg | ORAL_TABLET | Freq: Every day | ORAL | 11 refills | Status: DC

## 2021-10-14 NOTE — Progress Notes (Signed)
[facility] [address] [place]. [place] [address] [phone]       OFFICE CONSULT NOTE  [name] Date of Birth:  01-27-1940 Medical Record Number:  [id number]   Referring MD:  Emily Filbert  Reason for Referral:  stroke  HPI: [name] is a pleasant 81 year old Caucasian male seen today for initial office consultation visit for stroke.  He is accompanied by his wife and daughter-in-law.  History is obtained from them and review of electronic medical records and care everywhere.  No actual swelling are available in PACS but reports. are.  He has past medical history of diabetes, hypertension, atrial fibrillation, kidney stones, premature ventricular contractions and peripheral neuropathy states that he has had multiple strokes at least 6 since the last July.  He states initial strokes are small and he had only mild left-sided weakness and recovered well from that.  He was seen by Dr. Cleatrice Burke at [facility].Marland KitchenPatient had a right corona radiator infarct due to small vessel disease on 07/26/2021 limited with left-sided facial droop, drooling difficulty closing his eye with left facial weakness.  MRI also showing multiple old strokes as well as several areas of microhemorrhages.  Patient history of atrial fibrillation and had been on Eliquis for milligrams twice daily and Plavix was added.  Patient has been on Eliquis since 2021.  He was admitted to the hospital in [place] he had a urinary tract infection and had a prolonged hospital course complicated by bilateral deep vein thrombosis of the left bilateral pulmonary embolism.  He also had an MRI on 09/14/2021 which showed subacute right posterior limb internal capsule/thalamic infarct.  Had poor appetite and has not been feeling well hence he also underwent CT scan of the chest abdomen pelvis which was negative for any malignancy.  Lab work on 09/06/2021 showed elevated hemoglobin A1c of 8.8 and LDL cholesterol of  134 mg percent.  Patient states he is finished physical occupational therapy.  He still has some left facial weakness and diminished fine motor skills and improved balance and is otherwise doing well.  In the last few weeks his appetite has improved and he is eating well.    ROS:   14 system review of systems is positive for slurred speech, facial weakness gait imbalance, poor appetite, weight loss all other systems negative  PMH:  Past Medical History:  Diagnosis Date   Anemia    pernicious anemia   Arrhythmia    patient unaware of diagnosis except heart skips beats   Cancer (HCC)    lip cancer   Complication of anesthesia    unable to void after surgery and had to stay overnight for several days w/ cath   Diabetes mellitus without complication ([place])    Headache    History of kidney stones    Hypertension    Kidney stone    Stroke [facility])     Social History:  Social History   Socioeconomic History   Marital status: Married    Spouse name: zane, wife   Number of children: Not on file   Years of education: Not on file   Highest education level: Not on file  Occupational History   Occupation: cattle farmer    Comment: still working  Tobacco Use   Smoking status: Never   Smokeless tobacco: Never  Vaping Use   Vaping Use: Never used  Substance and Sexual Activity   Alcohol use: No    Comment: rare   Drug  use: No   Sexual activity: Yes    Birth control/protection: None  Other Topics Concern   Not on file  Social History Narrative   Not on file   Social Determinants of Health   Financial Resource Strain: Not on file  Food Insecurity: Not on file  Transportation Needs: Not on file  Physical Activity: Not on file  Stress: Not on file  Social Connections: Not on file  Intimate Partner Violence: Not on file    Medications:   Current Outpatient Medications on File Prior to Visit  Medication Sig Dispense Refill   Apoaequorin (PREVAGEN) 10 MG CAPS Take 10 mg by  mouth daily.     BD VEO INSULIN SYRINGE U/F 31G X 15/64" 0.3 ML MISC USE TO INJECT INSULIN 6 TIMES DAILY     CONTOUR NEXT TEST test strip TEST 3 TIMES A DAY AS DIRECTED     ELIQUIS 5 MG TABS tablet Take 5 mg by mouth 2 (two) times daily.     LEVEMIR 100 UNIT/ML injection Inject 10 Units into the skin every morning.     meclizine (ANTIVERT) 12.5 MG tablet Take 1 tablet (12.5 mg total) by mouth 3 (three) times daily as needed for dizziness. 20 tablet 0   omeprazole (PRILOSEC) 40 MG capsule Take 40 mg by mouth daily.     levothyroxine (SYNTHROID) 75 MCG tablet Take 75 mcg by mouth daily.      No current facility-administered medications on file prior to visit.    Allergies:   Allergies  Allergen Reactions   Mirtazapine     Other reaction(s): Dizziness    Physical Exam General: well developed, well nourished pleasant elderly Caucasian male, seated, in no evident distress Head: head normocephalic and atraumatic.   Neck: supple with no carotid or supraclavicular bruits Cardiovascular: regular rate and rhythm, no murmurs Musculoskeletal: no deformity Skin:  no rash/petichiae Vascular:  Normal pulses all extremities  Neurologic Exam Mental Status: Awake and fully alert. Oriented to place and time. Recent and remote memory intact. Attention span, concentration and fund of knowledge appropriate. Mood and affect appropriate.  Cranial Nerves: Fundoscopic exam reveals sharp disc margins. Pupils equal, briskly reactive to light. Extraocular movements full without nystagmus. Visual fields full to confrontation. Hearing mildly decreased bilaterally. Facial sensation intact.  Moderate left lower facial weakness., tongue, palate moves normally and symmetrically.  Motor: Normal bulk and tone. Normal strength in all tested extremity muscles.  Diminished fine finger movements on the left.  Left grip weakness.  Orbits right over left upper extremity. Sensory.: diminished touch , pinprick , position and  vibratory sensation from ankle down bilaterally.  Positive Romberg sign Coordination: Rapid alternating movements normal in all extremities. Finger-to-nose and heel-to-shin performed accurately bilaterally. Gait and Station: Arises from chair without difficulty. Stance is normal. Gait demonstrates normal stride length and balance . Able to heel, toe and tandem walk without difficulty.  Reflexes: 1+ and symmetric.  Ankle jerks are depressed bilaterally.  Toes downgoing.   NIHSS  2 Modified Rankin  3   ASSESSMENT: 81 year old Caucasian male with multiple bilateral mostly subcortical infarcts in the last year  and half and likely due to combination of moderate disease and atrial fibrillation.  Vascular risk factors of diabetes, hypertension, hyperlipidemia and atrial fibrillation.  History of DVT and pulmonary embolism       PLAN:I had a long d/w patient, his wife and daughter in law about his recent lacunar strokes, risk for recurrent stroke/TIAs, personally independently reviewed imaging  studies and stroke evaluation results and answered questions.Continue Eliquis (apixaban) daily  for secondary stroke prevention  given h/o pulmonary embolism and maintain strict control of hypertension with blood pressure goal below 130/90, diabetes with hemoglobin A1c goal below 6.5% and lipids with LDL cholesterol goal below 70 mg/dL. I also advised the patient to eat a healthy diet with plenty of whole grains, cereals, fruits and vegetables, exercise regularly and maintain ideal body weight . Check carotid and transcranial doppler studies. Start crestor 10 mg daily..Followup in the future with me in 6 months or call earlier if needed.  Greater than 50% time during this 45-minute visit was spent on counseling and coordination of care about his stroke and recent pulmonary embolism and answering questions. Antony Contras, MD  Note: This document was prepared with digital dictation and possible smart phrase technology.  Any transcriptional errors that result from this process are unintentional.

## 2021-10-14 NOTE — Patient Instructions (Signed)
I had a long d/w patient, his wife and daughter in law about his recent lacunar strokes, risk for recurrent stroke/TIAs, personally independently reviewed imaging studies and stroke evaluation results and answered questions.Continue Eliquis (apixaban) daily  for secondary stroke prevention  given h/o pulmonary embolism and maintain strict control of hypertension with blood pressure goal below 130/90, diabetes with hemoglobin A1c goal below 6.5% and lipids with LDL cholesterol goal below 70 mg/dL. I also advised the patient to eat a healthy diet with plenty of whole grains, cereals, fruits and vegetables, exercise regularly and maintain ideal body weight . Check carotid and transcranial doppler studies. Start crestor 10 mg daily..Followup in the future with me in 6 months or call earlier if needed.  Stroke Prevention Some medical conditions and behaviors can lead to a higher chance of having a stroke. You can help prevent a stroke by eating healthy, exercising, not smoking, and managing any medical conditions you have. Stroke is a leading cause of functional impairment. Primary prevention is particularly important because a majority of strokes are first-time events. Stroke changes the lives of not only those who experience a stroke but also their family and other caregivers. How can this condition affect me? A stroke is a medical emergency and should be treated right away. A stroke can lead to brain damage and can sometimes be life-threatening. If a person gets medical treatment right away, there is a better chance of surviving and recovering from a stroke. What can increase my risk? The following medical conditions may increase your risk of a stroke: Cardiovascular disease. High blood pressure (hypertension). Diabetes. High cholesterol. Sickle cell disease. Blood clotting disorders (hypercoagulable state). Obesity. Sleep disorders (obstructive sleep apnea). Other risk factors include: Being older  than age 26. Having a history of blood clots, stroke, or mini-stroke (transient ischemic attack, TIA). Genetic factors, such as race, ethnicity, or a family history of stroke. Smoking cigarettes or using other tobacco products. Taking birth control pills, especially if you also use tobacco. Heavy use of alcohol or drugs, especially cocaine and methamphetamine. Physical inactivity. What actions can I take to prevent this? Manage your health conditions High cholesterol levels. Eating a healthy diet is important for preventing high cholesterol. If cholesterol cannot be managed through diet alone, you may need to take medicines. Take any prescribed medicines to control your cholesterol as told by your health care provider. Hypertension. To reduce your risk of stroke, try to keep your blood pressure below 130/80. Eating a healthy diet and exercising regularly are important for controlling blood pressure. If these steps are not enough to manage your blood pressure, you may need to take medicines. Take any prescribed medicines to control hypertension as told by your health care provider. Ask your health care provider if you should monitor your blood pressure at home. Have your blood pressure checked every year, even if your blood pressure is normal. Blood pressure increases with age and some medical conditions. Diabetes. Eating a healthy diet and exercising regularly are important parts of managing your blood sugar (glucose). If your blood sugar cannot be managed through diet and exercise, you may need to take medicines. Take any prescribed medicines to control your diabetes as told by your health care provider. Get evaluated for obstructive sleep apnea. Talk to your health care provider about getting a sleep evaluation if you snore a lot or have excessive sleepiness. Make sure that any other medical conditions you have, such as atrial fibrillation or atherosclerosis, are managed. Nutrition Follow  instructions from your health care provider about what to eat or drink to help manage your health condition. These instructions may include: Reducing your daily calorie intake. Limiting how much salt (sodium) you use to 1,500 milligrams (mg) each day. Using only healthy fats for cooking, such as olive oil, canola oil, or sunflower oil. Eating healthy foods. You can do this by: Choosing foods that are high in fiber, such as whole grains, and fresh fruits and vegetables. Eating at least 5 servings of fruits and vegetables a day. Try to fill one-half of your plate with fruits and vegetables at each meal. Choosing lean protein foods, such as lean cuts of meat, poultry without skin, fish, tofu, beans, and nuts. Eating low-fat dairy products. Avoiding foods that are high in sodium. This can help lower blood pressure. Avoiding foods that have saturated fat, trans fat, and cholesterol. This can help prevent high cholesterol. Avoiding processed and prepared foods. Counting your daily carbohydrate intake.  Lifestyle If you drink alcohol: Limit how much you have to: 0-1 drink a day for women who are not pregnant. 0-2 drinks a day for men. Know how much alcohol is in your drink. In the [place] one drink equals one 12 oz bottle of beer (362m), one 5 oz glass of wine (1484m, or one 1 oz glass of hard liquor (4476m Do not use any products that contain nicotine or tobacco. These products include cigarettes, chewing tobacco, and vaping devices, such as e-cigarettes. If you need help quitting, ask your health care provider. Avoid secondhand smoke. Do not use drugs. Activity  Try to stay at a healthy weight. Get at least 30 minutes of exercise on most days, such as: Fast walking. Biking. Swimming. Medicines Take over-the-counter and prescription medicines only as told by your health care provider. Aspirin or blood thinners (antiplatelets or anticoagulants) may be recommended to reduce your risk of  forming blood clots that can lead to stroke. Avoid taking birth control pills. Talk to your health care provider about the risks of taking birth control pills if: You are over 35 22ars old. You smoke. You get very bad headaches. You have had a blood clot. Where to find more information American Stroke Association: www.strokeassociation.org Get help right away if: You or a loved one has any symptoms of a stroke. "BE FAST" is an easy way to remember the main warning signs of a stroke: B - Balance. Signs are dizziness, sudden trouble walking, or loss of balance. E - Eyes. Signs are trouble seeing or a sudden change in vision. F - Face. Signs are sudden weakness or numbness of the face, or the face or eyelid drooping on one side. A - Arms. Signs are weakness or numbness in an arm. This happens suddenly and usually on one side of the body. S - Speech. Signs are sudden trouble speaking, slurred speech, or trouble understanding what people say. T - Time. Time to call emergency services. Write down what time symptoms started. You or a loved one has other signs of a stroke, such as: A sudden, severe headache with no known cause. Nausea or vomiting. Seizure. These symptoms may represent a serious problem that is an emergency. Do not wait to see if the symptoms will go away. Get medical help right away. Call your local emergency services (911 in the [place] Do not drive yourself to the hospital. Summary You can help to prevent a stroke by eating healthy, exercising, not smoking, limiting alcohol intake, and managing any medical conditions  you may have. Do not use any products that contain nicotine or tobacco. These include cigarettes, chewing tobacco, and vaping devices, such as e-cigarettes. If you need help quitting, ask your health care provider. Remember "BE FAST" for warning signs of a stroke. Get help right away if you or a loved one has any of these signs. This information is not intended to  replace advice given to you by your health care provider. Make sure you discuss any questions you have with your health care provider. Document Revised: 07/29/2019 Document Reviewed: 07/29/2019 Elsevier Patient Education  [place].

## 2021-10-20 ENCOUNTER — Ambulatory Visit: Payer: Medicare Other | Admitting: Neurology

## 2021-10-28 ENCOUNTER — Telehealth: Payer: Self-pay | Admitting: Neurology

## 2021-10-28 NOTE — Telephone Encounter (Signed)
Per pt's office note Check carotid and transcranial doppler studies was the recommendation.  Will have referrals look into this.

## 2021-10-28 NOTE — Telephone Encounter (Signed)
Pt daughter in law came in stating pt has not gotten a call about scheduling CT scan that was supposed to be scheduled after visit 10/14/2021. Pt daughter in law would like to know who they can call to go ahead with scheduling this.

## 2021-11-01 IMAGING — DX DG CHEST 1V PORT
1 series · 1 of 1 positions shown · non-contrast
Comparison: Remote CT 02/17/2004

CLINICAL DATA: Shortness of breath.

EXAM:
PORTABLE CHEST 1 VIEW

[chest ap]
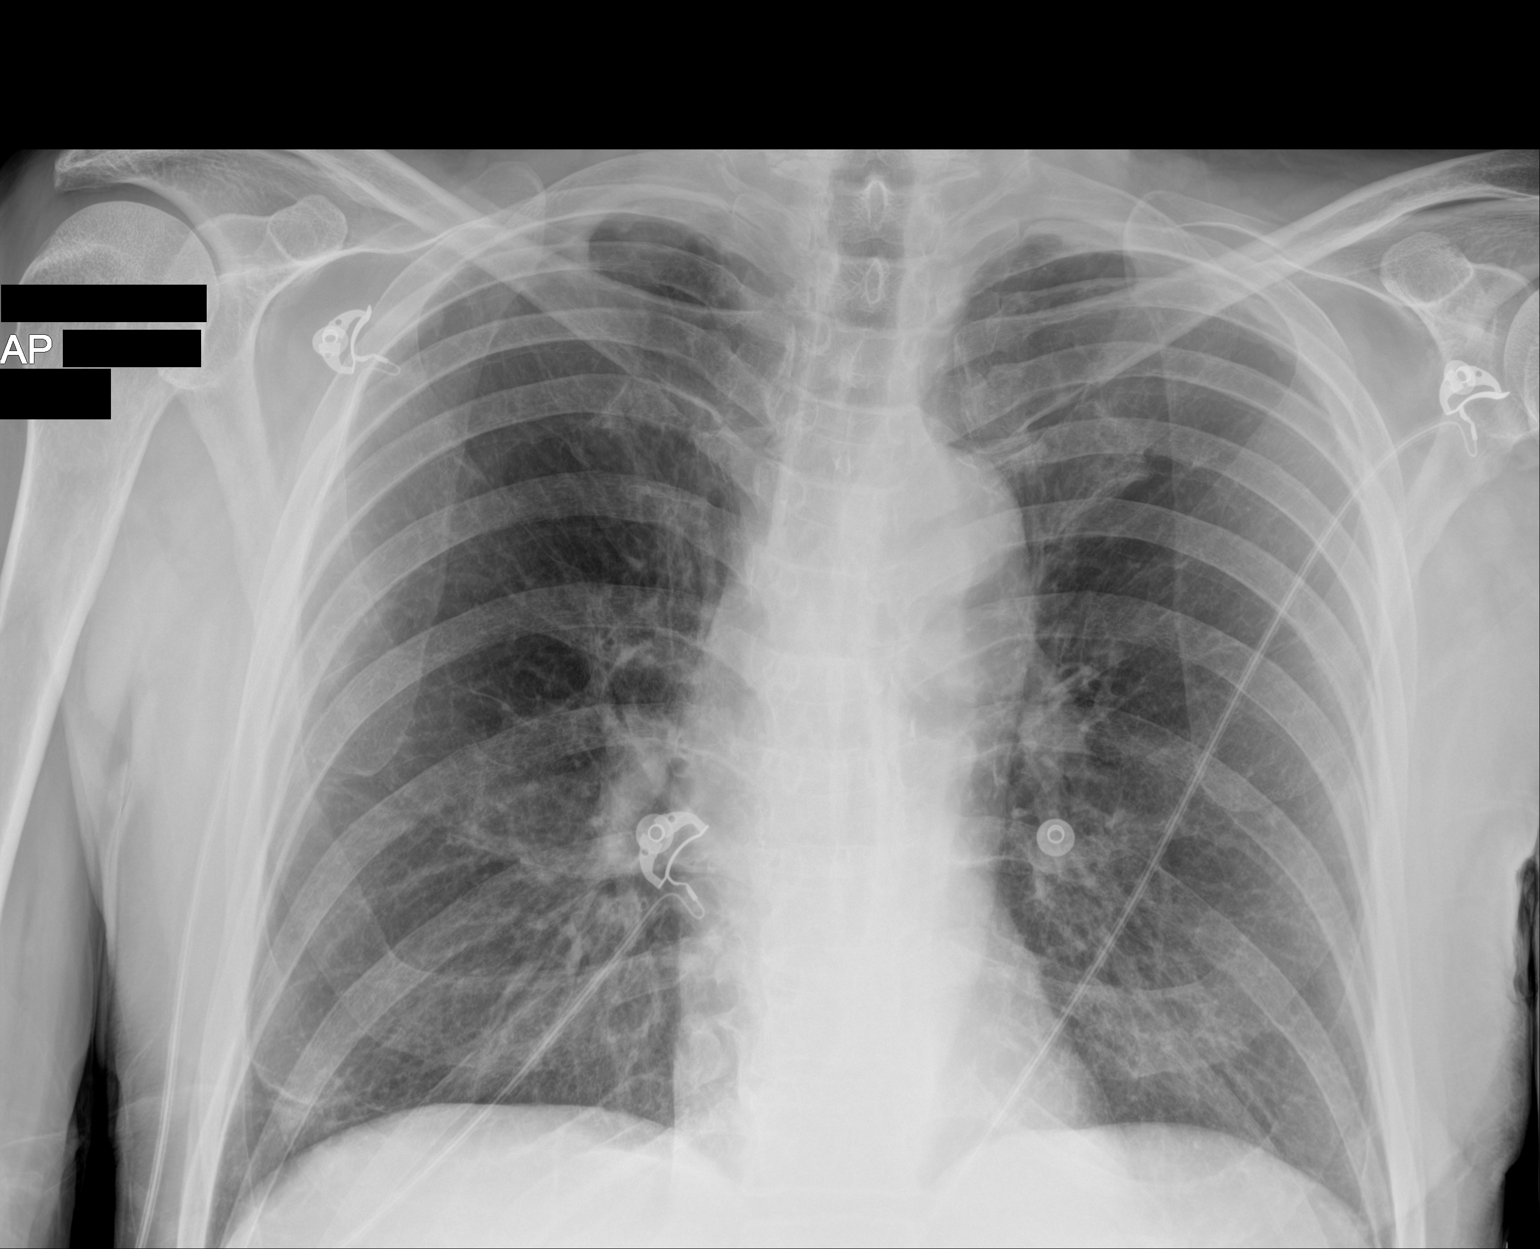

[1 of 1 positions shown; findings below may reference images not displayed]

FINDINGS: Borderline hyperinflation. Mild central bronchial thickening.
Subsegmental opacity at the right lung base. Heart is normal in
size. There is mild aortic tortuosity. No pulmonary edema. No
pneumothorax or pleural effusion. No evidence of pulmonary mass. No
acute osseous abnormalities are seen.
IMPRESSION: 1. Subsegmental right lung base opacity, favor atelectasis over
pneumonia.
2. Borderline hyperinflation with mild bronchial thickening, may
represent bronchitis, asthma, or smoking related lung disease.
Recommend correlation for smoking history.

## 2021-11-01 NOTE — Telephone Encounter (Signed)
Kurt Hernandez  You 4 days ago   [facility] They called the patient to schedule on 10/12 and 10/18 and left messages. The number to call is [phone]   I called the pt's wife and updated on this message. She verbalized understanding and appreciation for the call. She will call [facility] back and have this appt scheduled.

## 2021-11-17 ENCOUNTER — Ambulatory Visit (HOSPITAL_COMMUNITY)
Admission: RE | Admit: 2021-11-17 | Discharge: 2021-11-17 | Disposition: A | Discharge status: Home / Self Care | Payer: Medicare Other | Source: Ambulatory Visit | Attending: Neurology | Admitting: Neurology

## 2021-11-17 ENCOUNTER — Ambulatory Visit (HOSPITAL_BASED_OUTPATIENT_CLINIC_OR_DEPARTMENT_OTHER)
Admission: RE | Admit: 2021-11-17 | Discharge: 2021-11-17 | Disposition: A | Discharge status: Home / Self Care | Payer: Medicare Other | Source: Ambulatory Visit | Attending: Neurology | Admitting: Neurology

## 2021-11-17 DIAGNOSIS — I6381 Other cerebral infarction due to occlusion or stenosis of small artery: Secondary | ICD-10-CM

## 2021-11-17 NOTE — Progress Notes (Signed)
Carotid duplex & tcd has been completed.   Preliminary results in CV Proc.   Kurt Hernandez 11/17/2021 1:36 PM

## 2021-11-19 NOTE — Progress Notes (Signed)
Kindly inform the patient that carotid ultrasound study did not show any significant blockage of either carotid artery in the neck.

## 2021-11-19 NOTE — Progress Notes (Signed)
Kindly inform the patient that transcranial Doppler study showed normal flow velocities in the major blood vessels in the brain.  Nothing to worry about

## 2021-11-22 ENCOUNTER — Telehealth: Payer: Self-pay

## 2021-11-22 NOTE — Telephone Encounter (Signed)
-----   Message from Garvin Fila, MD sent at 11/19/2021  4:14 PM EST ----- Kindly inform the patient that carotid ultrasound study did not show any significant blockage of either carotid artery in the neck.

## 2021-11-22 NOTE — Telephone Encounter (Signed)
I called patient to discuss.  No answer, left a voicemail on home number asking him to call us back.  If patient calls back another day please route to POD 2.

## 2021-11-22 NOTE — Telephone Encounter (Signed)
-----   Message from Garvin Fila, MD sent at 11/19/2021  4:14 PM EST ----- Kindly inform the patient that transcranial Doppler study showed normal flow velocities in the major blood vessels in the brain.  Nothing to worry about

## 2021-11-24 NOTE — Telephone Encounter (Signed)
Called the patient for the 2nd time per DPR able to Leave a detailed message.  LVM advising of the normal study and asked the pt to call back with questions.  **If pt calls back, please inform that Dr Leonie Man reviewed the result of the transcranial dopplers which showed normal blood flow in the blood vessels of the brain. There was nothing that appeared concerning.

## 2021-11-24 NOTE — Telephone Encounter (Signed)
**  also he reviewed the carotid dopplers that were also normal.

## 2022-03-07 ENCOUNTER — Inpatient Hospital Stay
Admission: EM | Admit: 2022-03-07 | Discharge: 2022-03-11 | DRG: 377 | Disposition: A | Discharge status: Home / Self Care | Payer: Medicare Other | Attending: Hospitalist | Admitting: Hospitalist

## 2022-03-07 ENCOUNTER — Other Ambulatory Visit: Payer: Self-pay

## 2022-03-07 DIAGNOSIS — E039 Hypothyroidism, unspecified: Secondary | ICD-10-CM | POA: Diagnosis present

## 2022-03-07 DIAGNOSIS — K922 Gastrointestinal hemorrhage, unspecified: Principal | ICD-10-CM | POA: Insufficient documentation

## 2022-03-07 DIAGNOSIS — I129 Hypertensive chronic kidney disease with stage 1 through stage 4 chronic kidney disease, or unspecified chronic kidney disease: Secondary | ICD-10-CM | POA: Diagnosis present

## 2022-03-07 DIAGNOSIS — I4891 Unspecified atrial fibrillation: Secondary | ICD-10-CM | POA: Diagnosis present

## 2022-03-07 DIAGNOSIS — K5900 Constipation, unspecified: Secondary | ICD-10-CM | POA: Diagnosis present

## 2022-03-07 DIAGNOSIS — Z85819 Personal history of malignant neoplasm of unspecified site of lip, oral cavity, and pharynx: Secondary | ICD-10-CM | POA: Diagnosis not present

## 2022-03-07 DIAGNOSIS — D510 Vitamin B12 deficiency anemia due to intrinsic factor deficiency: Secondary | ICD-10-CM | POA: Diagnosis present

## 2022-03-07 DIAGNOSIS — E782 Mixed hyperlipidemia: Secondary | ICD-10-CM | POA: Diagnosis present

## 2022-03-07 DIAGNOSIS — Z681 Body mass index (BMI) 19 or less, adult: Secondary | ICD-10-CM | POA: Diagnosis not present

## 2022-03-07 DIAGNOSIS — N1832 Chronic kidney disease, stage 3b: Secondary | ICD-10-CM | POA: Diagnosis present

## 2022-03-07 DIAGNOSIS — E041 Nontoxic single thyroid nodule: Secondary | ICD-10-CM | POA: Diagnosis present

## 2022-03-07 DIAGNOSIS — Z86711 Personal history of pulmonary embolism: Secondary | ICD-10-CM

## 2022-03-07 DIAGNOSIS — K921 Melena: Secondary | ICD-10-CM | POA: Diagnosis present

## 2022-03-07 DIAGNOSIS — E43 Unspecified severe protein-calorie malnutrition: Secondary | ICD-10-CM | POA: Diagnosis present

## 2022-03-07 DIAGNOSIS — Z79890 Hormone replacement therapy (postmenopausal): Secondary | ICD-10-CM

## 2022-03-07 DIAGNOSIS — Z8673 Personal history of transient ischemic attack (TIA), and cerebral infarction without residual deficits: Secondary | ICD-10-CM

## 2022-03-07 DIAGNOSIS — M5416 Radiculopathy, lumbar region: Secondary | ICD-10-CM | POA: Diagnosis present

## 2022-03-07 DIAGNOSIS — I77810 Thoracic aortic ectasia: Secondary | ICD-10-CM | POA: Diagnosis present

## 2022-03-07 DIAGNOSIS — Z823 Family history of stroke: Secondary | ICD-10-CM | POA: Diagnosis not present

## 2022-03-07 DIAGNOSIS — E11649 Type 2 diabetes mellitus with hypoglycemia without coma: Secondary | ICD-10-CM | POA: Diagnosis not present

## 2022-03-07 DIAGNOSIS — K219 Gastro-esophageal reflux disease without esophagitis: Secondary | ICD-10-CM | POA: Diagnosis present

## 2022-03-07 DIAGNOSIS — Z8249 Family history of ischemic heart disease and other diseases of the circulatory system: Secondary | ICD-10-CM

## 2022-03-07 DIAGNOSIS — Z5320 Procedure and treatment not carried out because of patient's decision for unspecified reasons: Secondary | ICD-10-CM | POA: Diagnosis present

## 2022-03-07 DIAGNOSIS — I2510 Atherosclerotic heart disease of native coronary artery without angina pectoris: Secondary | ICD-10-CM | POA: Diagnosis present

## 2022-03-07 DIAGNOSIS — N179 Acute kidney failure, unspecified: Secondary | ICD-10-CM | POA: Diagnosis present

## 2022-03-07 DIAGNOSIS — K648 Other hemorrhoids: Secondary | ICD-10-CM | POA: Diagnosis present

## 2022-03-07 DIAGNOSIS — Z7901 Long term (current) use of anticoagulants: Secondary | ICD-10-CM

## 2022-03-07 DIAGNOSIS — Z8679 Personal history of other diseases of the circulatory system: Secondary | ICD-10-CM

## 2022-03-07 DIAGNOSIS — E1122 Type 2 diabetes mellitus with diabetic chronic kidney disease: Secondary | ICD-10-CM | POA: Diagnosis present

## 2022-03-07 DIAGNOSIS — D62 Acute posthemorrhagic anemia: Secondary | ICD-10-CM | POA: Diagnosis present

## 2022-03-07 DIAGNOSIS — I10 Essential (primary) hypertension: Secondary | ICD-10-CM | POA: Diagnosis present

## 2022-03-07 DIAGNOSIS — Z794 Long term (current) use of insulin: Secondary | ICD-10-CM

## 2022-03-07 DIAGNOSIS — N1831 Chronic kidney disease, stage 3a: Secondary | ICD-10-CM | POA: Diagnosis present

## 2022-03-07 DIAGNOSIS — I2699 Other pulmonary embolism without acute cor pulmonale: Secondary | ICD-10-CM | POA: Diagnosis present

## 2022-03-07 HISTORY — DX: Gastrointestinal hemorrhage, unspecified: K92.2

## 2022-03-07 HISTORY — DX: Acute posthemorrhagic anemia: D62

## 2022-03-07 LAB — CBC
HCT: 29.4 % — ABNORMAL LOW (ref 39.0–52.0)
Hemoglobin: 9.8 g/dL — ABNORMAL LOW (ref 13.0–17.0)
MCH: 31.3 pg (ref 26.0–34.0)
MCHC: 33.3 g/dL (ref 30.0–36.0)
MCV: 93.9 fL (ref 80.0–100.0)
Platelets: 256 10*3/uL (ref 150–400)
RBC: 3.13 MIL/uL — ABNORMAL LOW (ref 4.22–5.81)
RDW: 13.3 % (ref 11.5–15.5)
WBC: 7.2 10*3/uL (ref 4.0–10.5)
nRBC: 0 % (ref 0.0–0.2)

## 2022-03-07 LAB — COMPREHENSIVE METABOLIC PANEL
ALT: 14 U/L (ref 0–44)
AST: 18 U/L (ref 15–41)
Albumin: 3.3 g/dL — ABNORMAL LOW (ref 3.5–5.0)
Alkaline Phosphatase: 69 U/L (ref 38–126)
Anion gap: 8 (ref 5–15)
BUN: 32 mg/dL — ABNORMAL HIGH (ref 8–23)
CO2: 29 mmol/L (ref 22–32)
Calcium: 9.4 mg/dL (ref 8.9–10.3)
Chloride: 98 mmol/L (ref 98–111)
Creatinine, Ser: 2.37 mg/dL — ABNORMAL HIGH (ref 0.61–1.24)
GFR, Estimated: 27 mL/min — ABNORMAL LOW (ref >60)
Glucose, Bld: 196 mg/dL — ABNORMAL HIGH (ref 70–99)
Potassium: 4.1 mmol/L (ref 3.5–5.1)
Sodium: 135 mmol/L (ref 135–145)
Total Bilirubin: 0.5 mg/dL (ref 0.3–1.2)
Total Protein: 6.2 g/dL — ABNORMAL LOW (ref 6.5–8.1)

## 2022-03-07 LAB — GLUCOSE, CAPILLARY
Glucose-Capillary: 114 mg/dL — ABNORMAL HIGH (ref 70–99)
Glucose-Capillary: 78 mg/dL (ref 70–99)

## 2022-03-07 LAB — TYPE AND SCREEN
ABO/RH(D): O NEG
Antibody Screen: NEGATIVE

## 2022-03-07 LAB — CBG MONITORING, ED: Glucose-Capillary: 75 mg/dL (ref 70–99)

## 2022-03-07 LAB — HEMOGLOBIN AND HEMATOCRIT, BLOOD
HCT: 27.8 % — ABNORMAL LOW (ref 39.0–52.0)
Hemoglobin: 9.2 g/dL — ABNORMAL LOW (ref 13.0–17.0)

## 2022-03-07 LAB — TROPONIN I (HIGH SENSITIVITY): Troponin I (High Sensitivity): 23 ng/L — ABNORMAL HIGH (ref <18)

## 2022-03-07 MED ORDER — ACETAMINOPHEN 650 MG RE SUPP: 650.0000 mg | Freq: Four times a day (QID) | RECTAL | Status: DC | PRN

## 2022-03-07 MED ORDER — PANTOPRAZOLE SODIUM 40 MG IV SOLR
40.0000 mg | Freq: Once | INTRAVENOUS | Status: AC
  Administered 2022-03-07: 40 mg via INTRAVENOUS
  Filled 2022-03-07: qty 10

## 2022-03-07 MED ORDER — ACETAMINOPHEN 325 MG PO TABS
650.0000 mg | ORAL_TABLET | Freq: Four times a day (QID) | ORAL | Status: DC | PRN
  Administered 2022-03-10: 650 mg via ORAL
  Filled 2022-03-07: qty 2

## 2022-03-07 MED ORDER — PANTOPRAZOLE INFUSION (NEW) - SIMPLE MED
8.0000 mg/h | INTRAVENOUS | Status: DC
  Administered 2022-03-07 – 2022-03-09 (×4): 8 mg/h via INTRAVENOUS
  Filled 2022-03-07 (×6): qty 100

## 2022-03-07 MED ORDER — LACTATED RINGERS IV SOLN: INTRAVENOUS | Status: DC

## 2022-03-07 MED ORDER — OXYCODONE HCL 5 MG PO TABS: 5.0000 mg | ORAL_TABLET | ORAL | Status: DC | PRN

## 2022-03-07 MED ORDER — INSULIN ASPART 100 UNIT/ML IJ SOLN
0.0000 [IU] | Freq: Three times a day (TID) | INTRAMUSCULAR | Status: DC
  Administered 2022-03-08: 1 [IU] via SUBCUTANEOUS
  Administered 2022-03-10: 4 [IU] via SUBCUTANEOUS
  Administered 2022-03-11: 2 [IU] via SUBCUTANEOUS
  Filled 2022-03-07 (×4): qty 1

## 2022-03-07 MED ORDER — LEVOTHYROXINE SODIUM 100 MCG PO TABS
100.0000 ug | ORAL_TABLET | Freq: Every day | ORAL | Status: DC
  Administered 2022-03-08 – 2022-03-11 (×3): 100 ug via ORAL
  Filled 2022-03-07 (×3): qty 1

## 2022-03-07 MED ORDER — LACTATED RINGERS IV BOLUS
1000.0000 mL | Freq: Once | INTRAVENOUS | Status: AC
  Administered 2022-03-07: 1000 mL via INTRAVENOUS

## 2022-03-07 MED ORDER — INSULIN GLARGINE-YFGN 100 UNIT/ML ~~LOC~~ SOLN
20.0000 [IU] | Freq: Every day | SUBCUTANEOUS | Status: DC
  Administered 2022-03-08 – 2022-03-09 (×2): 20 [IU] via SUBCUTANEOUS
  Filled 2022-03-07 (×3): qty 0.2

## 2022-03-07 MED ORDER — HYDRALAZINE HCL 20 MG/ML IJ SOLN
5.0000 mg | Freq: Four times a day (QID) | INTRAMUSCULAR | Status: DC | PRN
  Administered 2022-03-10 (×2): 5 mg via INTRAVENOUS
  Filled 2022-03-07: qty 1

## 2022-03-07 MED ORDER — INSULIN ASPART 100 UNIT/ML IJ SOLN: 0.0000 [IU] | Freq: Every day | INTRAMUSCULAR | Status: DC

## 2022-03-07 NOTE — ED Provider Notes (Signed)
[facility] Provider Note    Event Date/Time   First MD Initiated Contact with Patient 03/07/22 1524     (approximate)   History   Chief Complaint: Melena   HPI  Kurt Hernandez is a 82 y.o. male with a history of hypertension, hypothyroidism, CKD, diabetes, stroke who comes the ED due to lightheadedness with standing, fatigue, dyspnea with exertion, and black stool for the past week.  Also reports having 1 episode of hematemesis a few days ago.  [place] and [place].  He reports a remote history of a stomach ulcer.  He is on Eliquis which she last took this morning.  He ate breakfast today, and had a small snack around noon.  No fever, no chest pain.  No syncope falls or trauma.  He was seen in primary care office this morning, I reviewed their note.  He had orthostatic hypotension with a systolic blood pressure of 70.       Physical Exam   Triage Vital Signs: ED Triage Vitals  Enc Vitals Group     BP 03/07/22 1356 (!) 150/92     Pulse Rate 03/07/22 1356 78     Resp 03/07/22 1356 18     Temp 03/07/22 1356 97.6 F (36.4 C)     Temp Source 03/07/22 1356 Oral     SpO2 03/07/22 1356 100 %     Weight --      Height --      Head Circumference --      Peak Flow --      Pain Score 03/07/22 1346 3     Pain Loc --      Pain Edu? --      Excl. in [place]? --     Most recent vital signs: Vitals:   03/07/22 1356  BP: (!) 150/92  Pulse: 78  Resp: 18  Temp: 97.6 F (36.4 C)  SpO2: 100%    General: Awake, no distress.  CV:  Good peripheral perfusion.  Regular rate and rhythm.  Normal distal pulses. Resp:  Normal effort.  Clear to auscultation Abd:  No distention.  Soft nontender.  Digital rectal exam reveals melena, strongly Hemoccult positive. Other:  No signs of trauma.  No petechia   ED Results / Procedures / Treatments   Labs (all labs ordered are listed, but only abnormal results are displayed) Labs  Reviewed  COMPREHENSIVE METABOLIC PANEL - Abnormal; Notable for the following components:      Result Value   Glucose, Bld 196 (*)    BUN 32 (*)    Creatinine, Ser 2.37 (*)    Total Protein 6.2 (*)    Albumin 3.3 (*)    GFR, Estimated 27 (*)    All other components within normal limits  CBC - Abnormal; Notable for the following components:   RBC 3.13 (*)    Hemoglobin 9.8 (*)    HCT 29.4 (*)    All other components within normal limits  POC OCCULT BLOOD, ED  TYPE AND SCREEN     EKG Interpreted by me Sinus rhythm, rate of 95.  Normal axis, normal intervals.  Poor R wave progression.  Normal ST segments and T waves.  5 PVCs on the strip   RADIOLOGY    PROCEDURES:  Procedures   MEDICATIONS ORDERED IN ED: Medications  lactated ringers bolus 1,000 mL (1,000 mLs Intravenous New Bag/Given 03/07/22 1601)  pantoprazole (PROTONIX) injection  40 mg (40 mg Intravenous Given 03/07/22 1600)     IMPRESSION / MDM / ASSESSMENT AND PLAN / ED COURSE  I reviewed the triage vital signs and the nursing notes.  DDx: UGIB, anemia, aki, electrolyte abnormality, nstemi  Patient's presentation is most consistent with acute presentation with potential threat to life or bodily function.  Patient presents with symptoms of anemia in the setting of black stool and 1 episode of hematemesis a few days ago.  He is currently anticoagulated on Eliquis, last took this morning.  Rectal exam reveals melena.  Hemoglobin today is about 10, decreased from a baseline of 15 3 months ago indicating acute anemia.  Other blood cell lines are normal.  No signs of shock, not requiring transfusion at this time.  Will give IV fluids for hydration and hemodynamic support, IV Protonix, admit to hospitalist for further management.  GI notified.       FINAL CLINICAL IMPRESSION(S) / ED DIAGNOSES   Final diagnoses:  UGIB (upper gastrointestinal bleed)  Acute blood loss anemia  Current use of long term  anticoagulation     Rx / DC Orders   ED Discharge Orders     None        Note:  This document was prepared using Dragon voice recognition software and may include unintentional dictation errors.   Carrie Mew, MD 03/07/22 [phone]

## 2022-03-07 NOTE — H&P (Signed)
HISTORY AND PHYSICAL    Kurt Hernandez   [id number] DOB: 01-04-1941   Date of Service: 03/07/22 Requesting physician/APP from ED: Treatment Team:  Attending Provider: Carrie Mew, MD   PCP: Rusty Aus, MD     HPI: Kurt Hernandez is a 82 y.o. 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 He arrived 03/07/22 to ED via POV, sent by PCP for likely upper GI bleed. Pt reports dark bloody stool over the past week, constipation. Pt also states 1 occurrence of bright red blood in emesis and bright red blood in urine. Per pt, was lightheaded on standing in PCP office - records reviewed BP was 150/80. Pt reports he is is compliant w/ his Eliquis, per PCP notes unclear if taking PPI.   Hospital Course:  02/26: in ED, VSS, anemia w/ Hgb 9.8 down from 114.2 in 12/2021, AKI w/ Cr 2.37 and GFR 27 compared to Cr 1.6 and GFR 43 in 12/2021. EDP alerted Dr. Vicente Males of GI. Protonix gtt initiated, will need to be off Eliquis 2 days prior to procedure  Consultants:  Gastroenterology  Procedures: None at this time       ASSESSMENT & PLAN:   Principal Problem:   Upper GI bleed Active Problems:   Acquired hypothyroidism   Benign essential hypertension   Lumbar radiculopathy   Pernicious anemia   History of bilateral pulmonary embolism 2021   History of atrial fibrillation   Hyperlipidemia, mixed   Stage 3b chronic kidney disease ([place])   AKI (acute kidney injury) ([place])   Acute blood loss anemia (ABLA)  Upper GI bleed Acute blood loss anemia (ABLA) Protonix gtt CLD for now in case worse but needs to be off ELiquis 2 days prior to EGD GI Dr Vicente Males aware Monitor HH q8h  and transfuse Hgb <8  AKI (acute kidney injury) on  Stage 3b chronic kidney disease  IV fluids  Monitor BMP in AM  History of bilateral pulmonary embolism 2021 History of atrial fibrillation History CVA Hyperlipidemia, mixed Hold Eliquis Pt statin intolerant   IDDM Basal insulin Semglee 20 units daily SSI ac/hs  GERD Pernicious anemia Diverticulosis Internal hemorrhoids  GI to follow Protonix as above   Coronary Artery Disease based on CT Benign essential hypertension Thoracic Aortic Ectasia  VS per protocol Prn antihypertensive   Lumbar radiculopathy Tylenol, oxycodone prn   Acquired hypothyroidism Thyroid nodule  Continue synthroid       DVT prophylaxis: SCD Pertinent IV fluids/nutrition: LR 75 ml/h Central lines / invasive devices: none  Code Status: FULL CODE - pt would not want to be on long term life support however  Family Communication: wife at bedside in ED   Disposition: inpatient  TOC needs: none at this time  Barriers to discharge / significant pending items: medical w/u and tx as above                   Review of Systems:  Review of Systems  Constitutional:  Positive for malaise/fatigue and weight loss. Negative for chills and fever.  Respiratory:  Positive for shortness of breath. Negative for cough and hemoptysis.   Cardiovascular:  Negative for palpitations, orthopnea  and leg swelling.  Gastrointestinal:  Positive for blood in stool, constipation, heartburn, melena and vomiting. Negative for abdominal pain, diarrhea and nausea.  Genitourinary:  Positive for hematuria.  Musculoskeletal:  Positive for back pain. Negative for myalgias.  Skin:  Negative for rash.  Neurological:  Positive for dizziness (chronic balance issues d/t CVA) and weakness. Negative for focal weakness, loss of consciousness and headaches.  Psychiatric/Behavioral:  Negative for depression and suicidal ideas. The patient is not nervous/anxious.         has a past medical history of Anemia, Arrhythmia, Cancer ([place]), Complication of anesthesia, Diabetes mellitus without complication ([place]), Headache, History of kidney stones, Hypertension, Kidney stone, and Stroke ([place]). (Not in an outpatient encounter)   Allergies  Allergen Reactions   Mirtazapine     Other reaction(s): Dizziness   Social History   Socioeconomic History   Marital status: Married    Spouse name: zane, wife   Number of children: Not on file   Years of education: Not on file   Highest education level: Not on file  Occupational History   Occupation: cattle farmer    Comment: still working  Tobacco Use   Smoking status: Never   Smokeless tobacco: Never  Vaping Use   Vaping Use: Never used  Substance and Sexual Activity   Alcohol use: No    Comment: rare   Drug use: No   Sexual activity: Yes    Birth control/protection: None  Other Topics Concern   Not on file  Social History Narrative   Not on file   Social Determinants of Health   Financial Resource Strain: Not on file  Food Insecurity: Not on file  Transportation Needs: Not on file  Physical Activity: Not on file  Stress: Not on file  Social Connections: Not on file  Intimate Partner Violence: Not on file      family history includes Heart attack in his father; Stroke in his mother.  Past Surgical History:  Procedure Laterality Date   CYSTOSCOPY W/ RETROGRADES Right 11/14/2017   Procedure: CYSTOSCOPY WITH RETROGRADE PYELOGRAM;  Surgeon: Abbie Sons, MD;  Location: [facility];  Service: Urology;  Laterality: Right;   CYSTOSCOPY WITH STENT PLACEMENT Right 11/03/2017   Procedure: CYSTOSCOPY WITH STENT PLACEMENT;  Surgeon: Abbie Sons, MD;  Location: [facility];  Service: Urology;  Laterality: Right;   CYSTOSCOPY/RETROGRADE/URETEROSCOPY Right 11/03/2017   Procedure: CYSTOSCOPY/RETROGRADE/URETEROSCOPY;  Surgeon: Abbie Sons, MD;  Location: [facility];  Service: Urology;   Laterality: Right;   CYSTOSCOPY/URETEROSCOPY/HOLMIUM LASER/STENT PLACEMENT Right 11/14/2017   Procedure: CYSTOSCOPY/URETEROSCOPY/HOLMIUM LASER/STENT Exchange;  Surgeon: Abbie Sons, MD;  Location: [facility];  Service: Urology;  Laterality: Right;  stent exchange   EYE SURGERY Right    cataract extraction.  needed yag laser and still has problems    HAND SURGERY Right 1989   tendons came undone from bones in wrist. metal removed   HERNIA REPAIR Bilateral 2010   inguinal hernias   HERNIA REPAIR      No current facility-administered medications on file prior to encounter.   Current Outpatient Medications on File Prior to Encounter  Medication Sig Dispense Refill   Apoaequorin (PREVAGEN) 10 MG CAPS Take 10 mg by mouth daily.     BD VEO INSULIN SYRINGE U/F 31G X 15/64" 0.3 ML MISC USE TO INJECT INSULIN 6 TIMES DAILY     CONTOUR NEXT TEST test strip TEST 3 TIMES A DAY AS DIRECTED     ELIQUIS 5 MG TABS  tablet Take 5 mg by mouth 2 (two) times daily.     LEVEMIR 100 UNIT/ML injection Inject 10 Units into the skin every morning.     levothyroxine (SYNTHROID) 75 MCG tablet Take 75 mcg by mouth daily.      meclizine (ANTIVERT) 12.5 MG tablet Take 1 tablet (12.5 mg total) by mouth 3 (three) times daily as needed for dizziness. 20 tablet 0   omeprazole (PRILOSEC) 40 MG capsule Take 40 mg by mouth daily.     rosuvastatin (CRESTOR) 10 MG tablet Take 1 tablet (10 mg total) by mouth daily. 30 tablet 11         Objective Findings:  Vitals:   03/07/22 1356  BP: (!) 150/92  Pulse: 78  Resp: 18  Temp: 97.6 F (36.4 C)  TempSrc: Oral  SpO2: 100%   No intake or output data in the 24 hours ending 03/07/22 1631 There were no vitals filed for this visit.  Examination:  Physical Exam Constitutional:      General: He is not in acute distress.    Appearance: Normal appearance. He is not ill-appearing or diaphoretic.  Cardiovascular:     Rate and Rhythm: Normal rate and regular rhythm.   Pulmonary:     Effort: Pulmonary effort is normal. No respiratory distress.     Breath sounds: Normal breath sounds.  Musculoskeletal:     Cervical back: Neck supple.     Right lower leg: No edema.     Left lower leg: No edema.  Skin:    General: Skin is warm and dry.     Coloration: Skin is not jaundiced or pale.  Neurological:     General: No focal deficit present.     Mental Status: He is alert and oriented to person, place, and time. Mental status is at baseline.     Cranial Nerves: No cranial nerve deficit.     Motor: No weakness.  Psychiatric:        Mood and Affect: Mood normal.        Behavior: Behavior normal.        Thought Content: Thought content normal.        Judgment: Judgment normal.          Scheduled Medications:    Continuous Infusions:   PRN Medications:    Antimicrobials:  Anti-infectives (From admission, onward)    None           Data Reviewed: I have personally reviewed following labs and imaging studies  CBC: Recent Labs  Lab 03/07/22 1358  WBC 7.2  HGB 9.8*  HCT 29.4*  MCV 93.9  PLT [id number]   Basic Metabolic Panel: Recent Labs  Lab 03/07/22 1358  NA 135  K 4.1  CL 98  CO2 29  GLUCOSE 196*  BUN 32*  CREATININE 2.37*  CALCIUM 9.4   GFR: CrCl cannot be calculated (Unknown ideal weight.). Liver Function Tests: Recent Labs  Lab 03/07/22 1358  AST 18  ALT 14  ALKPHOS 69  BILITOT 0.5  PROT 6.2*  ALBUMIN 3.3*   No results for input(s): "LIPASE", "AMYLASE" in the last 168 hours. No results for input(s): "AMMONIA" in the last 168 hours. Coagulation Profile: No results for input(s): "INR", "PROTIME" in the last 168 hours. Cardiac Enzymes: No results for input(s): "CKTOTAL", "CKMB", "CKMBINDEX", "TROPONINI" in the last 168 hours. BNP (last 3 results) No results for input(s): "PROBNP" in the last 8760 hours. HbA1C: No results for input(s): "HGBA1C" in the last  72 hours. CBG: No results for input(s): "GLUCAP"  in the last 168 hours. Lipid Profile: No results for input(s): "CHOL", "HDL", "LDLCALC", "TRIG", "CHOLHDL", "LDLDIRECT" in the last 72 hours. Thyroid Function Tests: No results for input(s): "TSH", "T4TOTAL", "FREET4", "T3FREE", "THYROIDAB" in the last 72 hours. Anemia Panel: No results for input(s): "VITAMINB12", "FOLATE", "FERRITIN", "TIBC", "IRON", "RETICCTPCT" in the last 72 hours. Most Recent Urinalysis On File:     Component Value Date/Time   COLORURINE YELLOW 09/14/2021 1652   APPEARANCEUR HAZY (A) 09/14/2021 1652   APPEARANCEUR Clear 06/26/2019 1040   LABSPEC 1.025 09/14/2021 1652   PHURINE 5.0 09/14/2021 1652   GLUCOSEU 150 (A) 09/14/2021 1652   HGBUR NEGATIVE 09/14/2021 1652   BILIRUBINUR NEGATIVE 09/14/2021 1652   BILIRUBINUR Negative 06/26/2019 1040   KETONESUR NEGATIVE 09/14/2021 1652   PROTEINUR 30 (A) 09/14/2021 1652   NITRITE NEGATIVE 09/14/2021 1652   LEUKOCYTESUR NEGATIVE 09/14/2021 1652   Sepsis Labs: '@LABRCNTIP'$ (procalcitonin:4,lacticidven:4)  No results found for this or any previous visit (from the past 240 hour(s)).       Radiology Studies: No results found.           LOS: 0 days      Emeterio Reeve, DO [facility] 03/07/2022, 4:31 PM    Dictation software may have been used to generate the above note. Typos may occur and escape review in typed/dictated notes. Please contact Dr Sheppard Coil directly for clarity if needed.  Staff may message me via secure chat in [place]  but this may not receive an immediate response,  please page me for urgent matters!  If 7PM-7AM, please contact night coverage [email or web address]

## 2022-03-07 NOTE — Hospital Course (Signed)
Kurt Hernandez is a 82 y.o. 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   He arrived 03/07/22 to ED via POV, sent by PCP for likely upper GI bleed. Pt reports dark bloody stool over the past week, constipation. Pt also states 1 occurrence of bright red blood in emesis and bright red blood in urine. Per pt, was lightheaded on standing in PCP office - records reviewed BP was 150/80. Pt reports he is is compliant w/ his Eliquis, he stopped PPI some time ago w/ resolution of GERD symptoms 02/26: in ED, VSS, anemia w/ Hgb 9.8 down from 114.2 in 12/2021, AKI w/ Cr 2.37 and GFR 27 compared to Cr 1.6 and GFR 43 in 12/2021. EDP alerted Dr. Vicente Males of GI. Protonix gtt initiated, will need to be off Eliquis 2 days prior to procedure   Consultants:  Gastroenterology   Procedures: None at this time            ASSESSMENT & PLAN:   Principal Problem:   Upper GI bleed Active Problems:   Acquired hypothyroidism   Benign essential hypertension   Lumbar radiculopathy   Pernicious anemia   History of bilateral pulmonary embolism 2021   History of atrial fibrillation   Hyperlipidemia, mixed   Stage 3b chronic kidney disease ([place])   AKI (acute kidney injury) ([place])   Acute blood loss anemia (ABLA)   Upper GI bleed Acute blood loss anemia (ABLA) Protonix gtt CLD for now in case worse but needs to be off ELiquis 2 days prior to EGD GI Dr Vicente Males aware Monitor HH q8h and transfuse Hgb <8   AKI (acute kidney injury) on  Stage 3b chronic kidney disease  IV fluids  Monitor BMP in AM   History of bilateral pulmonary embolism 2021 History of atrial fibrillation History CVA Hyperlipidemia, mixed Hold Eliquis Pt statin intolerant     IDDM Basal insulin Semglee 20 units daily SSI ac/hs   GERD Pernicious anemia Diverticulosis Internal hemorrhoids  GI to follow Protonix as above    Coronary Artery Disease based on CT Benign essential hypertension Thoracic Aortic Ectasia  VS per protocol Prn antihypertensive    Lumbar radiculopathy Tylenol, oxycodone prn    Acquired hypothyroidism Thyroid nodule  Continue synthroid            DVT prophylaxis: SCD Pertinent IV fluids/nutrition: LR 75 ml/h Central lines / invasive devices: none   Code Status: FULL CODE - pt would not want to be on long term life support however   Disposition: inpatient  TOC needs: none at this time  Barriers to discharge / significant pending items: medical w/u and tx as above

## 2022-03-07 NOTE — ED Triage Notes (Addendum)
Pt to ED via POV. Pt sent by PCP for possible GI bleed. Pt reports melena over the past week. Pt also states 1 occurrence of bright red blood in emesis and bright red blood in urine. PCP reports pt was hypotensive in office. Pt is on Eliquis. Pt has no hx of GI bleed. Pt with hx stroke.

## 2022-03-08 DIAGNOSIS — K922 Gastrointestinal hemorrhage, unspecified: Secondary | ICD-10-CM | POA: Diagnosis not present

## 2022-03-08 LAB — BASIC METABOLIC PANEL
Anion gap: 7 (ref 5–15)
BUN: 25 mg/dL — ABNORMAL HIGH (ref 8–23)
CO2: 26 mmol/L (ref 22–32)
Calcium: 8.6 mg/dL — ABNORMAL LOW (ref 8.9–10.3)
Chloride: 103 mmol/L (ref 98–111)
Creatinine, Ser: 1.88 mg/dL — ABNORMAL HIGH (ref 0.61–1.24)
GFR, Estimated: 36 mL/min — ABNORMAL LOW (ref >60)
Glucose, Bld: 86 mg/dL (ref 70–99)
Potassium: 4.2 mmol/L (ref 3.5–5.1)
Sodium: 136 mmol/L (ref 135–145)

## 2022-03-08 LAB — HEMOGLOBIN A1C
Hgb A1c MFr Bld: 7.8 % — ABNORMAL HIGH (ref 4.8–5.6)
Mean Plasma Glucose: 177 mg/dL

## 2022-03-08 LAB — GLUCOSE, CAPILLARY
Glucose-Capillary: 99 mg/dL (ref 70–99)
Glucose-Capillary: 160 mg/dL — ABNORMAL HIGH (ref 70–99)
Glucose-Capillary: 72 mg/dL (ref 70–99)
Glucose-Capillary: 149 mg/dL — ABNORMAL HIGH (ref 70–99)

## 2022-03-08 LAB — HEMOGLOBIN AND HEMATOCRIT, BLOOD
HCT: 25.1 % — ABNORMAL LOW (ref 39.0–52.0)
HCT: 25.8 % — ABNORMAL LOW (ref 39.0–52.0)
Hemoglobin: 8.5 g/dL — ABNORMAL LOW (ref 13.0–17.0)
Hemoglobin: 8.6 g/dL — ABNORMAL LOW (ref 13.0–17.0)

## 2022-03-08 LAB — CBC
HCT: 25.8 % — ABNORMAL LOW (ref 39.0–52.0)
Hemoglobin: 8.7 g/dL — ABNORMAL LOW (ref 13.0–17.0)
MCH: 31 pg (ref 26.0–34.0)
MCHC: 33.7 g/dL (ref 30.0–36.0)
MCV: 91.8 fL (ref 80.0–100.0)
Platelets: 242 10*3/uL (ref 150–400)
RBC: 2.81 MIL/uL — ABNORMAL LOW (ref 4.22–5.81)
RDW: 13.2 % (ref 11.5–15.5)
WBC: 6.5 10*3/uL (ref 4.0–10.5)
nRBC: 0 % (ref 0.0–0.2)

## 2022-03-08 MED ORDER — BOOST / RESOURCE BREEZE PO LIQD CUSTOM
1.0000 | Freq: Three times a day (TID) | ORAL | Status: DC
  Administered 2022-03-08 – 2022-03-10 (×5): 1 via ORAL

## 2022-03-08 NOTE — Progress Notes (Signed)
PROGRESS NOTE    Kurt Hernandez   [id number] DOB: [birth year]  DOA: 03/07/2022 Date of Service: 03/08/22 PCP: Rusty Aus, MD     Brief Narrative / Hospital Course:  Kurt Hernandez is a 82 y.o. 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   He arrived 03/07/22 to ED via POV, sent by PCP for likely upper GI bleed. Pt reports dark bloody stool over the past week, constipation. Pt also states 1 occurrence of bright red blood in emesis and bright red blood in urine. Per pt, was lightheaded on standing in PCP office - records reviewed BP was 150/80. Pt reports he is is compliant w/ his Eliquis, he stopped PPI some time ago w/ resolution of GERD symptoms 02/26: in ED, VSS, anemia w/ Hgb 9.8 down from 114.2 in 12/2021, AKI w/ Cr 2.37 and GFR 27 compared to Cr 1.6 and GFR 43 in 12/2021. EDP alerted Dr. Vicente Males of GI. Protonix gtt initiated, will need to be off Eliquis 2 days prior to procedure 02/27: VSS, Hgb 8.7 this morning, follow HH mid-day and evening. Remains off Eliquis and anticipate EGD per GI possible Weds/Thurs   Consultants:  Gastroenterology   Procedures: None at this time            ASSESSMENT & PLAN:   Principal Problem:   Upper GI bleed Active Problems:   Acquired hypothyroidism   Benign essential hypertension   Lumbar radiculopathy   Pernicious anemia   History of bilateral pulmonary embolism 2021   History of atrial fibrillation   Hyperlipidemia, mixed   Stage 3a chronic kidney disease ([facility]) ([facility])   AKI (acute kidney injury) ([place])   Acute blood loss anemia (ABLA)   GI bleed     Upper GI bleed Acute blood loss anemia (ABLA) Protonix gtt CLD for now in  case clinical decline prompts emergent intervention, but needs to be off Eliquis 2 days prior to EGD GI Dr Vicente Males following  Monitor HH q8h and transfuse Hgb <8   AKI (acute kidney injury) - improving  Stage 3a chronic kidney disease  IV fluids LR 75 mL/h Monitor BMP daily   History of bilateral pulmonary embolism 2021 History of atrial fibrillation History CVA Hyperlipidemia, mixed Hold Eliquis Pt statin intolerant    IDDM Basal insulin Semglee 20 units daily SSI ac/hs   GERD Pernicious anemia Diverticulosis Internal hemorrhoids  GI to follow Protonix as above    Coronary Artery Disease based on CT Benign essential hypertension Thoracic Aortic Ectasia  VS per protocol Prn antihypertensive    Lumbar radiculopathy Tylenol, oxycodone prn    Acquired hypothyroidism Thyroid nodule  Continue synthroid            DVT prophylaxis: SCD Pertinent IV fluids/nutrition: LR 75 ml/h Central lines / invasive devices: none   Code Status: FULL CODE - pt would not want to be on long term life support however   Disposition: inpatient  TOC needs: none at this time  Barriers to discharge / significant pending items: medical w/u and tx as above              Subjective / Brief ROS:  Patient reports  doing well today no abd pain, no N/V Denies CP/SOB.  Pain controlled.  Denies new weakness.  Tolerating diet - CLD.  Reports no concerns w/ urination/defecation.   Family Communication: wife and friend at bedside on rounds     Objective Findings:  Vitals:   03/07/22 1943 03/07/22 2009 03/08/22 0401 03/08/22 0720  BP:  (!) 155/90 (!) 147/83 (!) 156/91  Pulse:  76 85 78  Resp:  '20 20 18  '$ Temp:  97.6 F (36.4 C) 98.6 F (37 C) 98.5 F (36.9 C)  TempSrc:  Oral Oral Oral  SpO2:  99% 96% 99%  Weight: 68 kg     Height: '6\' 2"'$  (1.88 m)       Intake/Output Summary (Last 24 hours) at 03/08/2022 1049 Last data filed at 03/08/2022 1010 Gross per 24 hour  Intake  2002.18 ml  Output --  Net 2002.18 ml   Filed Weights   03/07/22 1943  Weight: 68 kg    Examination:  Physical Exam Constitutional:      General: He is not in acute distress.    Appearance: Normal appearance.  Cardiovascular:     Rate and Rhythm: Normal rate and regular rhythm.  Pulmonary:     Effort: Pulmonary effort is normal.     Breath sounds: Normal breath sounds.  Neurological:     Mental Status: He is alert.          Scheduled Medications:   insulin aspart  0-5 Units Subcutaneous QHS   insulin aspart  0-6 Units Subcutaneous TID WC   insulin glargine-yfgn  20 Units Subcutaneous Daily   levothyroxine  100 mcg Oral Daily    Continuous Infusions:  lactated ringers 75 mL/hr at 03/08/22 0214   pantoprazole 8 mg/hr (03/08/22 0215)    PRN Medications:  acetaminophen **OR** acetaminophen, hydrALAZINE, oxyCODONE  Antimicrobials from admission:  Anti-infectives (From admission, onward)    None           Data Reviewed:  I have personally reviewed the following...  CBC: Recent Labs  Lab 03/07/22 1358 03/07/22 1715 03/08/22 0332  WBC 7.2  --  6.5  HGB 9.8* 9.2* 8.7*  HCT 29.4* 27.8* 25.8*  MCV 93.9  --  91.8  PLT 256  --  [id number]   Basic Metabolic Panel: Recent Labs  Lab 03/07/22 1358 03/08/22 0332  NA 135 136  K 4.1 4.2  CL 98 103  CO2 29 26  GLUCOSE 196* 86  BUN 32* 25*  CREATININE 2.37* 1.88*  CALCIUM 9.4 8.6*   GFR: Estimated Creatinine Clearance: 30.1 mL/min (A) (by C-G formula based on SCr of 1.88 mg/dL (H)). Liver Function Tests: Recent Labs  Lab 03/07/22 1358  AST 18  ALT 14  ALKPHOS 69  BILITOT 0.5  PROT 6.2*  ALBUMIN 3.3*   No results for input(s): "LIPASE", "AMYLASE" in the last 168 hours. No results for input(s): "AMMONIA" in the last 168 hours. Coagulation Profile: No results for input(s): "INR", "PROTIME" in the last 168 hours. Cardiac Enzymes: No results for input(s): "CKTOTAL", "CKMB", "CKMBINDEX", "TROPONINI"  in the last 168 hours. BNP (last 3 results) No results for input(s): "PROBNP" in the last 8760 hours. HbA1C: No results for input(s): "HGBA1C" in the last 72 hours. CBG: Recent Labs  Lab 03/07/22 1718 03/07/22 1938 03/07/22 2145 03/08/22 0722  GLUCAP 75 114* 78 99   Lipid Profile: No results for input(s): "CHOL", "HDL", "LDLCALC", "TRIG", "CHOLHDL", "LDLDIRECT" in the last 72 hours. Thyroid Function Tests: No  results for input(s): "TSH", "T4TOTAL", "FREET4", "T3FREE", "THYROIDAB" in the last 72 hours. Anemia Panel: No results for input(s): "VITAMINB12", "FOLATE", "FERRITIN", "TIBC", "IRON", "RETICCTPCT" in the last 72 hours. Most Recent Urinalysis On File:     Component Value Date/Time   COLORURINE YELLOW 09/14/2021 1652   APPEARANCEUR HAZY (A) 09/14/2021 1652   APPEARANCEUR Clear 06/26/2019 1040   LABSPEC 1.025 09/14/2021 1652   PHURINE 5.0 09/14/2021 1652   GLUCOSEU 150 (A) 09/14/2021 1652   HGBUR NEGATIVE 09/14/2021 1652   BILIRUBINUR NEGATIVE 09/14/2021 1652   BILIRUBINUR Negative 06/26/2019 1040   KETONESUR NEGATIVE 09/14/2021 1652   PROTEINUR 30 (A) 09/14/2021 1652   NITRITE NEGATIVE 09/14/2021 1652   LEUKOCYTESUR NEGATIVE 09/14/2021 1652   Sepsis Labs: '@LABRCNTIP'$ (procalcitonin:4,lacticidven:4) Microbiology: No results found for this or any previous visit (from the past 240 hour(s)).    Radiology Studies last 3 days: No results found.           LOS: 1 day    Emeterio Reeve, DO [facility] 03/08/2022, 10:49 AM    Dictation software may have been used to generate the above note. Typos may occur and escape review in typed/dictated notes. Please contact Dr Sheppard Coil directly for clarity if needed.  Staff may message me via secure chat in [place]  but this may not receive an immediate response,  please page me for urgent matters!  If 7PM-7AM, please contact night coverage [email or web address]

## 2022-03-08 NOTE — Progress Notes (Signed)
Initial Nutrition Assessment  DOCUMENTATION CODES:   Severe malnutrition in context of chronic illness  INTERVENTION:   Boost Breeze po TID, each supplement provides 250 kcal and 9 grams of protein  Ensure Enlive po TID with diet advancement, each supplement provides 350 kcal and 20 grams of protein.  MVI po daily with diet advancement   Pt at high refeed risk; recommend monitor potassium, magnesium and phosphorus labs daily until stable  NUTRITION DIAGNOSIS:   Severe Malnutrition related to chronic illness (multiple CVAs, uncontrolled DM) as evidenced by severe fat depletion, severe muscle depletion, 21 percent weight loss in one year.  GOAL:   Patient will meet greater than or equal to 90% of their needs  MONITOR:   PO intake, Supplement acceptance, Diet advancement, Labs, Weight trends, I & O's, Skin  REASON FOR ASSESSMENT:   Malnutrition Screening Tool    ASSESSMENT:   82 y.o. 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  Met with pt in room today. Pt reports good appetite and oral intake at baseline but reports that despite good appetite he has continued to loose weight. Pt reports that he used to weigh 229lbs a couple of years ago. Pt reports that over the past year, he has had multiple strokes; pt also reports trouble regulating his blood sugars and reports that he now requires insulin. Per chart, pt is down 39lbs(21%) over the past year; this is significant weight loss. Pt reports eating clear liquids well in hospital and has ordered his dinner for tonight. RD discussed with pt the importance of adequate nutrition needed to preserve lean muscle. Pt is willing to drink chocolate or strawberry Ensure with diet advancement. Pt is likely at refeed risk. Plan is for EGD once off coagulation. Pt  refusing colonoscopy.   Medications reviewed and include: insulin, synthroid, LRS '@75ml'$ /hr, protonix   Labs reviewed: K 4.2 wnl, BUN 25(H), creat 1.88(H) Hgb 8.5(L), Hct 25.1(L) Cbgs- 72, 160, 99 x 24 hrs   NUTRITION - FOCUSED PHYSICAL EXAM:  Flowsheet Row Most Recent Value  Orbital Region Moderate depletion  Upper Arm Region Severe depletion  Thoracic and Lumbar Region Severe depletion  Buccal Region Moderate depletion  Temple Region Moderate depletion  Clavicle Bone Region Severe depletion  Clavicle and Acromion Bone Region Severe depletion  Scapular Bone Region Moderate depletion  Dorsal Hand Severe depletion  Patellar Region Severe depletion  Anterior Thigh Region Severe depletion  Posterior Calf Region Severe depletion  Edema (RD Assessment) None  Hair Reviewed  Eyes Reviewed  Mouth Reviewed  Skin Reviewed  Nails Reviewed   Diet Order:   Diet Order             Diet clear liquid Room service appropriate? Yes; Fluid consistency: Thin  Diet effective now                  EDUCATION NEEDS:   Education needs have been addressed  Skin:  Skin Assessment: Reviewed RN Assessment  Last BM:  2/26  Height:   Ht Readings from Last 1 Encounters:  03/07/22 '6\' 2"'$  (1.88 m)    Weight:   Wt Readings from Last 1 Encounters:  03/08/22 68 kg    Ideal Body Weight:  86.3 kg  BMI:  Body mass index is 19.26 kg/m.  Estimated Nutritional Needs:   Kcal:  2000-2300kcal/day  Protein:  100-115g/day  Fluid:  1.7-2.0L/day  Koleen Distance MS, RD, LDN Please refer to [facility] for RD and/or RD on-call/weekend/after hours pager

## 2022-03-08 NOTE — Consult Note (Signed)
Kurt Hernandez , MD [address], [place], [place], [place], [address] [address], [place], [place], [place], [address] Phone: [phone]  Fax: [phone]  Consultation  Referring Provider:     Dr Sheppard Coil Primary Care Physician:  Rusty Aus, MD Primary Gastroenterologist:  None          Reason for Consultation:     GI bleed   Date of Admission:  03/07/2022 Date of Consultation:  03/08/2022         HPI:   Kurt Hernandez is a 82 y.o. male who has type 2 diabetes, hypertension, pernicious anemia history of CVA bilateral pulmonary embolism in April 2021 on Eliquis, CKD presented to the emergency room with dark bloody stools for the past week, 1 episode of bright red emesis and bright red blood in the urine.   On presentation hemoglobin was 9.8 g down from 15.1 g 5 months back.  Baseline creatinine 1.81 was elevated on admission at 2.37 down to 1.88 presently.  He says that he was in [place] last week and had some gagging followed by hematemesis and dark-colored stools for a few days which has since resolved.  Denies any heartburn.  Last dose of Eliquis was taken on 03/07/2018 4 in the morning.  Denies any abdominal pain.  He does suffer from constipation. No recent urine analysis.   Past Medical History:  Diagnosis Date   Anemia    pernicious anemia   Arrhythmia    patient unaware of diagnosis except heart skips beats   Cancer ([place])    lip cancer   Complication of anesthesia    unable to void after surgery and had to stay overnight for several days w/ cath   Diabetes mellitus without complication ([place])    Headache    History of kidney stones    Hypertension    Kidney stone    Stroke [facility])     Past Surgical History:  Procedure Laterality Date   CYSTOSCOPY W/ RETROGRADES Right 11/14/2017   Procedure: CYSTOSCOPY WITH RETROGRADE PYELOGRAM;  Surgeon: Abbie Sons, MD;  Location: [facility] ORS;  Service: Urology;  Laterality: Right;   CYSTOSCOPY WITH STENT PLACEMENT  Right 11/03/2017   Procedure: CYSTOSCOPY WITH STENT PLACEMENT;  Surgeon: Abbie Sons, MD;  Location: [facility] ORS;  Service: Urology;  Laterality: Right;   CYSTOSCOPY/RETROGRADE/URETEROSCOPY Right 11/03/2017   Procedure: CYSTOSCOPY/RETROGRADE/URETEROSCOPY;  Surgeon: Abbie Sons, MD;  Location: [facility] ORS;  Service: Urology;  Laterality: Right;   CYSTOSCOPY/URETEROSCOPY/HOLMIUM LASER/STENT PLACEMENT Right 11/14/2017   Procedure: CYSTOSCOPY/URETEROSCOPY/HOLMIUM LASER/STENT Exchange;  Surgeon: Abbie Sons, MD;  Location: [facility] ORS;  Service: Urology;  Laterality: Right;  stent exchange   EYE SURGERY Right    cataract extraction.  needed yag laser and still has problems    HAND SURGERY Right 1989   tendons came undone from bones in wrist. metal removed   HERNIA REPAIR Bilateral 2010   inguinal hernias   HERNIA REPAIR      Prior to Admission medications   Medication Sig Start Date End Date Taking? Authorizing Provider  ELIQUIS 5 MG TABS tablet Take 5 mg by mouth 2 (two) times daily. 04/29/19  Yes [provider]  LEVEMIR 100 UNIT/ML injection Inject 25 Units into the skin every morning. 04/05/19  Yes [provider]  levothyroxine (SYNTHROID) 75 MCG tablet Take 75 mcg by mouth daily. 12/18/21  Yes [provider]  meclizine (ANTIVERT) 12.5 MG tablet Take 1 tablet (12.5 mg total) by  mouth 3 (three) times daily as needed for dizziness. 09/14/21  Yes Drenda Freeze, MD  meloxicam (MOBIC) 7.5 MG tablet Take 7.5 mg by mouth daily. 12/09/21  Yes [provider]  mirtazapine (REMERON) 7.5 MG tablet Take 7.5 mg by mouth at bedtime.   Yes [provider]  omeprazole (PRILOSEC) 40 MG capsule Take 40 mg by mouth 2 (two) times daily before a meal. 04/16/19  Yes [provider]  venlafaxine XR (EFFEXOR-XR) 75 MG 24 hr capsule Take 75 mg by mouth daily. 03/04/22  Yes [provider]  Apoaequorin (PREVAGEN) 10 MG CAPS Take 10 mg by mouth  daily. Patient not taking: Reported on 03/07/2022    [provider]  BD VEO INSULIN SYRINGE U/F 31G X 15/64" 0.3 ML MISC USE TO INJECT INSULIN 6 TIMES DAILY 04/05/19   [provider]  CONTOUR NEXT TEST test strip TEST 3 TIMES A DAY AS DIRECTED 04/06/19   [provider]  rosuvastatin (CRESTOR) 10 MG tablet Take 1 tablet (10 mg total) by mouth daily. Patient not taking: Reported on 03/07/2022 10/14/21 10/14/22  Garvin Fila, MD    Family History  Problem Relation Age of Onset   Heart attack Father    Stroke Mother      Social History   Tobacco Use   Smoking status: Never   Smokeless tobacco: Never  Vaping Use   Vaping Use: Never used  Substance Use Topics   Alcohol use: No    Comment: rare   Drug use: No    Allergies as of 03/07/2022 - Review Complete 03/07/2022  Allergen Reaction Noted   Mirtazapine Other (See Comments) 05/13/2013    Review of Systems:    All systems reviewed and negative except where noted in HPI.   Physical Exam:  Vital signs in last 24 hours: Temp:  [97.6 F (36.4 C)-98.6 F (37 C)] 98.5 F (36.9 C) (02/27 0720) Pulse Rate:  [76-85] 78 (02/27 0720) Resp:  [18-20] 18 (02/27 0720) BP: (147-176)/(83-92) 156/91 (02/27 0720) SpO2:  [96 %-100 %] 99 % (02/27 0720) Weight:  [68 kg] 68 kg (02/26 1943) Last BM Date : 03/07/22 General:   Pleasant, cooperative in NAD Head:  Normocephalic and atraumatic. Eyes:   No icterus.   Conjunctiva pink. PERRLA. Ears:  Normal auditory acuity. Neck:  Supple; no masses or thyroidomegaly Lungs: Respirations even and unlabored. Lungs clear to auscultation bilaterally.   No wheezes, crackles, or rhonchi.  Heart:  Regular rate and rhythm;  Without murmur, clicks, rubs or gallops Abdomen:  Soft, nondistended, nontender. Normal bowel sounds. No appreciable masses or hepatomegaly.  No rebound or guarding.  Neurologic:  Alert and oriented x3;  grossly normal neurologically. Psych:  Alert and  cooperative. Normal affect.  LAB RESULTS: Recent Labs    03/07/22 1358 03/07/22 1715 03/08/22 0332  WBC 7.2  --  6.5  HGB 9.8* 9.2* 8.7*  HCT 29.4* 27.8* 25.8*  PLT 256  --  242   BMET Recent Labs    03/07/22 1358 03/08/22 0332  NA 135 136  K 4.1 4.2  CL 98 103  CO2 29 26  GLUCOSE 196* 86  BUN 32* 25*  CREATININE 2.37* 1.88*  CALCIUM 9.4 8.6*   LFT Recent Labs    03/07/22 1358  PROT 6.2*  ALBUMIN 3.3*  AST 18  ALT 14  ALKPHOS 69  BILITOT 0.5   PT/INR No results for input(s): "LABPROT", "INR" in the last 72 hours.  STUDIES:  No results found.    Impression / Plan:   Kurt Hernandez is a 82 y.o. y/o male present to the emergency room with 1 episode of emesis and bloody stools for the past few days.  He is on Eliquis.  Last dose taken yesterday 03/07/2018 3 in the morning  Plan 1.  Monitor CBC and transfuse as needed 2.  If has acute hemorrhage and consider CT angiogram otherwise we will plan for EGD once he is off Eliquis for at least 2 days i.e. Thursday.  He has had a colonoscopy in the past and had an unpleasant experience and does not wish to undergo colonoscopy at this point of time 3.  PPI 4.  For his constipation I explained that he can take MiraLAX 1-2 times a day. 5.  History of hematuria which may require further evaluation   I have discussed alternative options, risks & benefits,  which include, but are not limited to, bleeding, infection, perforation,respiratory complication & drug reaction.  The patient agrees with this plan & written consent will be obtained.     Thank you for involving me in the care of this patient.      LOS: 1 day   Kurt Bellows, MD  03/08/2022, 7:45 AM

## 2022-03-09 DIAGNOSIS — E43 Unspecified severe protein-calorie malnutrition: Secondary | ICD-10-CM | POA: Insufficient documentation

## 2022-03-09 DIAGNOSIS — K922 Gastrointestinal hemorrhage, unspecified: Secondary | ICD-10-CM | POA: Diagnosis not present

## 2022-03-09 LAB — CBC
HCT: 26 % — ABNORMAL LOW (ref 39.0–52.0)
Hemoglobin: 8.9 g/dL — ABNORMAL LOW (ref 13.0–17.0)
MCH: 31.2 pg (ref 26.0–34.0)
MCHC: 34.2 g/dL (ref 30.0–36.0)
MCV: 91.2 fL (ref 80.0–100.0)
Platelets: 244 10*3/uL (ref 150–400)
RBC: 2.85 MIL/uL — ABNORMAL LOW (ref 4.22–5.81)
RDW: 13.1 % (ref 11.5–15.5)
WBC: 6.4 10*3/uL (ref 4.0–10.5)
nRBC: 0 % (ref 0.0–0.2)

## 2022-03-09 LAB — GLUCOSE, CAPILLARY
Glucose-Capillary: 86 mg/dL (ref 70–99)
Glucose-Capillary: 128 mg/dL — ABNORMAL HIGH (ref 70–99)
Glucose-Capillary: 55 mg/dL — ABNORMAL LOW (ref 70–99)
Glucose-Capillary: 50 mg/dL — ABNORMAL LOW (ref 70–99)
Glucose-Capillary: 51 mg/dL — ABNORMAL LOW (ref 70–99)
Glucose-Capillary: 60 mg/dL — ABNORMAL LOW (ref 70–99)
Glucose-Capillary: 143 mg/dL — ABNORMAL HIGH (ref 70–99)

## 2022-03-09 LAB — COMPREHENSIVE METABOLIC PANEL
ALT: 12 U/L (ref 0–44)
AST: 14 U/L — ABNORMAL LOW (ref 15–41)
Albumin: 2.9 g/dL — ABNORMAL LOW (ref 3.5–5.0)
Alkaline Phosphatase: 59 U/L (ref 38–126)
Anion gap: 6 (ref 5–15)
BUN: 20 mg/dL (ref 8–23)
CO2: 25 mmol/L (ref 22–32)
Calcium: 8.8 mg/dL — ABNORMAL LOW (ref 8.9–10.3)
Chloride: 103 mmol/L (ref 98–111)
Creatinine, Ser: 1.7 mg/dL — ABNORMAL HIGH (ref 0.61–1.24)
GFR, Estimated: 40 mL/min — ABNORMAL LOW (ref >60)
Glucose, Bld: 52 mg/dL — ABNORMAL LOW (ref 70–99)
Potassium: 3.4 mmol/L — ABNORMAL LOW (ref 3.5–5.1)
Sodium: 134 mmol/L — ABNORMAL LOW (ref 135–145)
Total Bilirubin: 0.4 mg/dL (ref 0.3–1.2)
Total Protein: 5.2 g/dL — ABNORMAL LOW (ref 6.5–8.1)

## 2022-03-09 MED ORDER — VENLAFAXINE HCL ER 75 MG PO CP24
75.0000 mg | ORAL_CAPSULE | Freq: Every day | ORAL | Status: DC
  Administered 2022-03-10: 75 mg via ORAL
  Filled 2022-03-09: qty 1

## 2022-03-09 MED ORDER — MIRTAZAPINE 15 MG PO TABS
7.5000 mg | ORAL_TABLET | Freq: Every day | ORAL | Status: DC
  Administered 2022-03-09 – 2022-03-10 (×2): 7.5 mg via ORAL
  Filled 2022-03-09 (×2): qty 1

## 2022-03-09 MED ORDER — DEXTROSE 5 % IV SOLN: INTRAVENOUS | Status: DC

## 2022-03-09 MED ORDER — POTASSIUM CHLORIDE CRYS ER 20 MEQ PO TBCR
40.0000 meq | EXTENDED_RELEASE_TABLET | Freq: Once | ORAL | Status: AC
  Administered 2022-03-09: 40 meq via ORAL
  Filled 2022-03-09: qty 2

## 2022-03-09 MED ORDER — DEXTROSE 10 % IV SOLN: INTRAVENOUS | Status: DC

## 2022-03-09 MED ORDER — INSULIN GLARGINE-YFGN 100 UNIT/ML ~~LOC~~ SOLN
10.0000 [IU] | Freq: Every day | SUBCUTANEOUS | Status: DC
  Filled 2022-03-09: qty 0.1

## 2022-03-09 NOTE — TOC CM/SW Note (Signed)
  Transition of Care [facility]) Screening Note   Patient Details  Name: Kurt Hernandez Date of Birth: [birth year]   Transition of Care [facility]) CM/SW Contact:    Candie Chroman, LCSW Phone Number: 03/09/2022, 8:16 AM    Transition of Care Department [facility]) has reviewed patient and no TOC needs have been identified at this time. We will continue to monitor patient advancement through interdisciplinary progression rounds. If new patient transition needs arise, please place a TOC consult.

## 2022-03-09 NOTE — Inpatient Diabetes Management (Signed)
Inpatient Diabetes Program Recommendations  AACE/ADA: New Consensus Statement on Inpatient Glycemic Control (2015)  Target Ranges:  Prepandial:   less than 140 mg/dL      Peak postprandial:   less than 180 mg/dL (1-2 hours)      Critically ill patients:  140 - 180 mg/dL   Lab Results  Component Value Date   GLUCAP 86 03/09/2022   HGBA1C 7.8 (H) 03/07/2022    Review of Glycemic Control  Latest Reference Range & Units 03/08/22 07:22 03/08/22 11:43 03/08/22 16:23 03/08/22 20:45 03/09/22 07:31  Glucose-Capillary 70 - 99 mg/dL 99 160 (H) 72 149 (H) 86  (H): Data is abnormally high  Diabetes history: DM2 Outpatient Diabetes medications: Levemir 25 units QD Current orders for Inpatient glycemic control: Semglee 20 units QD, Novolog 0-6 units TID and 0-5 units QHS  Inpatient Diabetes Program Recommendations:    Please consider decreasing basal insulin to Semglee 16 units QD.  Will continue to follow while inpatient.  Thank you, Reche Dixon, MSN, [place] Diabetes Coordinator Inpatient Diabetes Program [phone] (team pager from 8a-5p)

## 2022-03-09 NOTE — Progress Notes (Signed)
  PROGRESS NOTE    Kurt Hernandez  [id number] DOB: 1940/09/10 DOA: 03/07/2022 PCP: Rusty Aus, MD  [id number]/201A-AA  LOS: 2 days   Brief hospital course:   Assessment & Plan: Kurt Hernandez is a 82 y.o. 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     Upper GI bleed Acute blood loss anemia (ABLA) Hgb 9.8 down from 114.2 in 12/2021  Protonix gtt CLD for now in case clinical decline prompts emergent intervention, but needs to be off Eliquis 2 days prior to EGD GI Dr Vicente Males following  EGD on Thursday   AKI (acute kidney injury) - improving  Stage 3a chronic kidney disease  --cont MIVF as D5@50$    History of bilateral pulmonary embolism 2021 History of atrial fibrillation --hold Eliquis   History CVA Hyperlipidemia, mixed Pt statin intolerant    IDDM Hypoglycemia due to reduced oral intake Basal insulin Semglee 20 units daily PTA --pt became hypoglycemics after receiving home dose of long-acting insulin and being on clear liquid diet --start D5@50$  for 1 day --reduce glargine to 10u daily   GERD --PPI gtt  Pernicious anemia Diverticulosis Internal hemorrhoids    Coronary Artery Disease 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     Objective: Vitals:   03/09/22 0532 03/09/22 0727 03/09/22 1621 03/09/22 1928  BP: (!) 153/94 (!) 152/86 (!) 148/82 (!) 154/98  Pulse: 66 71 68 78  Resp: 20 14 16 18  $ Temp: 97.7 F (36.5 C) 98.2 F (36.8 C) 98 F (36.7 C) 98.1 F (36.7 C)  TempSrc:   Oral Oral  SpO2: 98% 99% 98% 98%  Weight:      Height:        Intake/Output Summary (Last 24 hours) at 03/09/2022 1936 Last data filed at 03/09/2022 1930 Gross per 24 hour  Intake 50 ml  Output 750 ml  Net -700 ml   Filed Weights   03/07/22 1943 03/08/22 1654 03/09/22 0500  Weight: 68 kg 68 kg 70.8 kg    Examination:   Constitutional: NAD, AAOx3 HEENT: conjunctivae and lids normal, EOMI CV: No cyanosis.   RESP: normal respiratory effort, on RA Neuro: II - XII grossly intact.   Psych: Normal mood and affect.  Appropriate judgement and reason   Data Reviewed: I have personally reviewed labs and imaging studies  Time spent: 50 minutes  Enzo Bi, MD [facility] If 7PM-7AM, please contact night-coverage 03/09/2022, 7:36 PM

## 2022-03-09 NOTE — Progress Notes (Signed)
   Kurt Hernandez , MD [address] [place], [place], [place], [address] [address], [place], [place], [place], [address] Phone: [phone]  Fax: [phone]   [name] is being followed for melena  Day 1 of follow up   Subjective: Doing well , stools are less black today    Objective: Vital signs in last 24 hours: Vitals:   03/08/22 2036 03/09/22 0500 03/09/22 0532 03/09/22 0727  BP: (!) 155/97  (!) 153/94 (!) 152/86  Pulse: 79  66 71  Resp: 16  20 14  $ Temp: 97.6 F (36.4 C)  97.7 F (36.5 C) 98.2 F (36.8 C)  TempSrc:      SpO2: 94%  98% 99%  Weight:  70.8 kg    Height:       Weight change: 0.04 kg  Intake/Output Summary (Last 24 hours) at 03/09/2022 [id number] Last data filed at 03/08/2022 1910 Gross per 24 hour  Intake 880 ml  Output --  Net 880 ml     Exam: Heart:: S1S2 present Lungs: normal Abdomen: soft, nontender, normal bowel sounds   Lab Results: @LABTEST2$ @ Micro Results: No results found for this or any previous visit (from the past 240 hour(s)). Studies/Results: No results found. Medications: I have reviewed the patient's current medications. Scheduled Meds:  feeding supplement  1 Container Oral TID BM   insulin aspart  0-5 Units Subcutaneous QHS   insulin aspart  0-6 Units Subcutaneous TID WC   insulin glargine-yfgn  20 Units Subcutaneous Daily   levothyroxine  100 mcg Oral Daily   Continuous Infusions:  lactated ringers 75 mL/hr at 03/08/22 2015   pantoprazole 8 mg/hr (03/09/22 0253)   PRN Meds:.acetaminophen **OR** acetaminophen, hydrALAZINE, oxyCODONE   Assessment: Principal Problem:   UGIB (upper gastrointestinal bleed) Active Problems:   Acquired hypothyroidism   Benign essential hypertension   Lumbar radiculopathy   Pernicious anemia   History of bilateral pulmonary embolism 2021   History of atrial fibrillation   Hyperlipidemia, mixed   Stage 3a chronic kidney disease (CKD) ([place])   AKI (acute kidney injury)  ([place])   Acute blood loss anemia (ABLA)   GI bleed  Kurt Hernandez is a 82 y.o. y/o male present to the emergency room with 1 episode of emesis and bloody stools for the past few days.  He is on Eliquis.  Last dose taken yesterday 03/07/2022  in the morning. Hb has been stable since yesterday    Plan 1.  Monitor CBC and transfuse as needed 2.  EGD tomorrow and if negative may need a colonoscopy which he says will think about tomorrow  3.  PPI 4.  For his constipation I explained that he can take MiraLAX 1-2 times a day. 5.  History of hematuria which may require further evaluation   I have discussed alternative options, risks & benefits,  which include, but are not limited to, bleeding, infection, perforation,respiratory complication & drug reaction.  The patient agrees with this plan & written consent will be obtained.      LOS: 2 days   Kurt Bellows, MD 03/09/2022, 9:09 AM

## 2022-03-09 NOTE — Progress Notes (Signed)
       CROSS COVER NOTE  NAME: Kurt Hernandez MRN: [id number] DOB : [birth year]    HPI/Events of Note   Hey Dr. Damita Dunnings. Patient is admitted with upper GI bleed. He is scheduled for an EGD tomorrow. He has had an issue throughout the day with hypoglycemia due to NPO status I am guessing. Patient called stating that he felt weak. Checked blood glucose and it was 60. Started his D5 fluids and gave orange juice with sugar. Blood glucose actually dropped after interventions to 50 and 51. Patient states that he feels better and is no longer weak. Do you want to get a serum glucose to confirm or do you want me to push some dextrose?   Assessment and  Interventions   Assessment: Hypoglycemia in spite of D5 and patient n.p.o. for EGD in the a.m. Plan: D10 to replace D5 CBG every hour x 2 and every 2 hours x 2 then  every 4 X

## 2022-03-09 NOTE — Progress Notes (Signed)
Patient is admitted with upper GI bleed. He is scheduled for an EGD tomorrow. He has had an issue throughout the day with hypoglycemia. Patient called stating that he felt weak. Checked blood glucose and it was 60. Started his D5 fluids and gave orange juice with sugar. Blood glucose actually dropped after interventions to 50 and 51. Patient states that he feels better and is no longer weak. Contacted MD Damita Dunnings.

## 2022-03-10 ENCOUNTER — Inpatient Hospital Stay: Payer: Medicare Other | Admitting: Registered Nurse

## 2022-03-10 ENCOUNTER — Other Ambulatory Visit: Payer: Self-pay

## 2022-03-10 ENCOUNTER — Encounter
Admission: EM | Disposition: A | Discharge status: Home / Self Care | Payer: Self-pay | Source: Home / Self Care | Attending: Hospitalist

## 2022-03-10 DIAGNOSIS — K922 Gastrointestinal hemorrhage, unspecified: Secondary | ICD-10-CM | POA: Diagnosis not present

## 2022-03-10 HISTORY — PX: ESOPHAGOGASTRODUODENOSCOPY (EGD) WITH PROPOFOL: SHX5813

## 2022-03-10 LAB — BASIC METABOLIC PANEL WITH GFR
Anion gap: 3 — ABNORMAL LOW (ref 5–15)
BUN: 16 mg/dL (ref 8–23)
CO2: 27 mmol/L (ref 22–32)
Calcium: 8.9 mg/dL (ref 8.9–10.3)
Chloride: 105 mmol/L (ref 98–111)
Creatinine, Ser: 1.78 mg/dL — ABNORMAL HIGH (ref 0.61–1.24)
GFR, Estimated: 38 mL/min — ABNORMAL LOW
Glucose, Bld: 157 mg/dL — ABNORMAL HIGH (ref 70–99)
Potassium: 4.9 mmol/L (ref 3.5–5.1)
Sodium: 135 mmol/L (ref 135–145)

## 2022-03-10 LAB — CBC
HCT: 26.9 % — ABNORMAL LOW (ref 39.0–52.0)
Hemoglobin: 9.1 g/dL — ABNORMAL LOW (ref 13.0–17.0)
MCH: 30.8 pg (ref 26.0–34.0)
MCHC: 33.8 g/dL (ref 30.0–36.0)
MCV: 91.2 fL (ref 80.0–100.0)
Platelets: 231 10*3/uL (ref 150–400)
RBC: 2.95 MIL/uL — ABNORMAL LOW (ref 4.22–5.81)
RDW: 13.3 % (ref 11.5–15.5)
WBC: 5.6 10*3/uL (ref 4.0–10.5)
nRBC: 0 % (ref 0.0–0.2)

## 2022-03-10 LAB — GLUCOSE, CAPILLARY
Glucose-Capillary: 190 mg/dL — ABNORMAL HIGH (ref 70–99)
Glucose-Capillary: 168 mg/dL — ABNORMAL HIGH (ref 70–99)
Glucose-Capillary: 135 mg/dL — ABNORMAL HIGH (ref 70–99)
Glucose-Capillary: 111 mg/dL — ABNORMAL HIGH (ref 70–99)
Glucose-Capillary: 99 mg/dL (ref 70–99)
Glucose-Capillary: 106 mg/dL — ABNORMAL HIGH (ref 70–99)
Glucose-Capillary: 125 mg/dL — ABNORMAL HIGH (ref 70–99)
Glucose-Capillary: 326 mg/dL — ABNORMAL HIGH (ref 70–99)
Glucose-Capillary: 125 mg/dL — ABNORMAL HIGH (ref 70–99)

## 2022-03-10 LAB — MAGNESIUM: Magnesium: 1.9 mg/dL (ref 1.7–2.4)

## 2022-03-10 SURGERY — ESOPHAGOGASTRODUODENOSCOPY (EGD) WITH PROPOFOL
Anesthesia: General

## 2022-03-10 MED ORDER — PEG 3350-KCL-NA BICARB-NACL 420 G PO SOLR
4000.0000 mL | Freq: Once | ORAL | Status: AC
  Administered 2022-03-10: 4000 mL via ORAL
  Filled 2022-03-10: qty 4000

## 2022-03-10 MED ORDER — PROPOFOL 10 MG/ML IV BOLUS
INTRAVENOUS | Status: DC | PRN
  Administered 2022-03-10: 70 mg via INTRAVENOUS

## 2022-03-10 MED ORDER — SODIUM CHLORIDE 0.9 % IV SOLN: INTRAVENOUS | Status: DC

## 2022-03-10 MED ORDER — PROPOFOL 500 MG/50ML IV EMUL
INTRAVENOUS | Status: DC | PRN
  Administered 2022-03-10: 150 ug/kg/min via INTRAVENOUS

## 2022-03-10 MED ORDER — LIDOCAINE HCL (CARDIAC) PF 100 MG/5ML IV SOSY
PREFILLED_SYRINGE | INTRAVENOUS | Status: DC | PRN
  Administered 2022-03-10: 40 mg via INTRAVENOUS

## 2022-03-10 MED ORDER — HYDRALAZINE HCL 20 MG/ML IJ SOLN
10.0000 mg | Freq: Four times a day (QID) | INTRAMUSCULAR | Status: DC | PRN
  Administered 2022-03-11: 10 mg via INTRAVENOUS
  Filled 2022-03-10: qty 1

## 2022-03-10 MED ORDER — DEXMEDETOMIDINE HCL IN NACL 80 MCG/20ML IV SOLN
INTRAVENOUS | Status: DC | PRN
  Administered 2022-03-10: 8 ug via INTRAVENOUS

## 2022-03-10 MED ORDER — HYDRALAZINE HCL 20 MG/ML IJ SOLN
INTRAMUSCULAR | Status: AC
  Filled 2022-03-10: qty 1

## 2022-03-10 NOTE — Anesthesia Preprocedure Evaluation (Addendum)
Anesthesia Evaluation  Patient identified by MRN, date of birth, ID band Patient awake    Reviewed: Allergy & Precautions, NPO status , Patient's Chart, lab work & pertinent test results  History of Anesthesia Complications Negative for: history of anesthetic complications  Airway Mallampati: IV   Neck ROM: Full    Dental  (+) Partial Upper, Lower Dentures, Implants   Pulmonary neg pulmonary ROS   Pulmonary exam normal breath sounds clear to auscultation       Cardiovascular hypertension, Normal cardiovascular exam+ dysrhythmias (a fib)  Rhythm:Regular Rate:Normal  Hx PE on Eliquis  ECG 09/15/21: SR with PACs; nonspecific ST abnormality; no STEMI  Echo 08/25/21:  NORMAL LEFT VENTRICULAR SYSTOLIC FUNCTION   WITH MILD LVH  NORMAL RIGHT VENTRICULAR SYSTOLIC FUNCTION  MILD VALVULAR REGURGITATION  NO VALVULAR STENOSIS  MILD MR, TR ,PR  TRIVIAL AR  EF 55-60%    Neuro/Psych  Neuromuscular disease (neuropathy) CVA, No Residual Symptoms    GI/Hepatic ,GERD  ,,  Endo/Other  diabetes, Type 2Hypothyroidism    Renal/GU Renal disease (nephrolithiasis; stage III CKD)     Musculoskeletal   Abdominal   Peds  Hematology  (+) Blood dyscrasia, anemia   Anesthesia Other Findings   Reproductive/Obstetrics                             Anesthesia Physical Anesthesia Plan  ASA: 3  Anesthesia Plan: General   Post-op Pain Management:    Induction: Intravenous  PONV Risk Score and Plan: 2 and Propofol infusion, TIVA and Treatment may vary due to age or medical condition  Airway Management Planned: Natural Airway  Additional Equipment:   Intra-op Plan:   Post-operative Plan:   Informed Consent: I have reviewed the patients History and Physical, chart, labs and discussed the procedure including the risks, benefits and alternatives for the proposed anesthesia with the patient or authorized  representative who has indicated his/her understanding and acceptance.       Plan Discussed with: CRNA  Anesthesia Plan Comments: (LMA/GETA backup discussed.  Patient consented for risks of anesthesia including but not limited to:  - adverse reactions to medications - damage to eyes, teeth, lips or other oral mucosa - nerve damage due to positioning  - sore throat or hoarseness - damage to heart, brain, nerves, lungs, other parts of body or loss of life  Informed patient about role of CRNA in peri- and intra-operative care.  Patient voiced understanding.)        Anesthesia Quick Evaluation

## 2022-03-10 NOTE — Op Note (Signed)
[facility] [facility] Gastroenterology Patient Name: Kurt Hernandez Procedure Date: 03/10/2022 9:15 AM MRN: [id number] Account #: [id number] Date of Birth: September 23, 1940 Admit Type: Outpatient Age: 82 Room: [facility] ENDO ROOM 1 Gender: Male Note Status: Finalized Instrument Name: Upper Endoscope [id number] Procedure:             Upper GI endoscopy Indications:           Hematemesis Providers:             [name] MD, MD Referring MD:          Rusty Aus, MD (Referring MD) Medicines:             Monitored Anesthesia Care Complications:         No immediate complications. Procedure:             Pre-Anesthesia Assessment:                        - Prior to the procedure, a History and Physical was                         performed, and patient medications, allergies and                         sensitivities were reviewed. The patient's tolerance                         of previous anesthesia was reviewed.                        - The risks and benefits of the procedure and the                         sedation options and risks were discussed with the                         patient. All questions were answered and informed                         consent was obtained.                        - ASA Grade Assessment: II - A patient with mild                         systemic disease.                        After obtaining informed consent, the endoscope was                         passed under direct vision. Throughout the procedure,                         the patient's blood pressure, pulse, and oxygen                         saturations were monitored continuously. The Endoscope                         was introduced  through the mouth, and advanced to the                         third part of duodenum. The upper GI endoscopy was                         accomplished with ease. The patient tolerated the                         procedure well. Findings:      The esophagus was  normal.      The stomach was normal.      The examined duodenum was normal. Impression:            - Normal esophagus.                        - Normal stomach.                        - Normal examined duodenum.                        - No specimens collected. Recommendation:        - Return patient to hospital ward for ongoing care.                        - Clear liquid diet today.                        - Perform a colonoscopy tomorrow. Procedure Code(s):     --- Professional ---                        [phone], Esophagogastroduodenoscopy, flexible,                         transoral; diagnostic, including collection of                         specimen(s) by brushing or washing, when performed                         (separate procedure) Diagnosis Code(s):     --- Professional ---                        K92.0, Hematemesis CPT copyright 2022 American Medical Association. All rights reserved. The codes documented in this report are preliminary and upon coder review may  be revised to meet current compliance requirements. [name], MD [name] MD, MD 03/10/2022 9:35:33 AM This report has been signed electronically. Number of Addenda: 0 Note Initiated On: 03/10/2022 9:15 AM Estimated Blood Loss:  Estimated blood loss: none.      [facility]

## 2022-03-10 NOTE — Transfer of Care (Signed)
Immediate Anesthesia Transfer of Care Note  Patient: Kurt Hernandez  Procedure(s) Performed: Procedure(s): ESOPHAGOGASTRODUODENOSCOPY (EGD) WITH PROPOFOL (N/A)  Patient Location: PACU and Endoscopy Unit  Anesthesia Type:General  Level of Consciousness: sedated  Airway & Oxygen Therapy: Patient Spontanous Breathing and Patient connected to nasal cannula oxygen  Post-op Assessment: Report given to RN and Post -op Vital signs reviewed and stable  Post vital signs: Reviewed and stable  Last Vitals:  Vitals:   03/10/22 0916 03/10/22 0938  BP: 135/72 135/72  Pulse: 69 68  Resp: 18 (!) 21  Temp: (!) 36.2 C   SpO2: 0000000 0000000    Complications: No apparent anesthesia complications

## 2022-03-10 NOTE — Progress Notes (Signed)
PATIENT RECEIVED FROM PACU FOLLOWING NORMAL EGD. ALERT AND ORIENTED. REQUESTING SOMETHING TO EAT. FAMILY AT BEDSIDE. PATIENT IN STABLE CONDITION.

## 2022-03-10 NOTE — H&P (Signed)
[name], MD [address], [place], [place], [place], [address] [address] [place], [place], [place], [address] Phone: [phone]  Fax: [phone]  Primary Care Physician:  Rusty Aus, MD   Pre-Procedure History & Physical: HPI:  Kurt Hernandez is a 82 y.o. male is here for an endoscopy    Past Medical History:  Diagnosis Date   Anemia    pernicious anemia   Arrhythmia    patient unaware of diagnosis except heart skips beats   Cancer ([place])    lip cancer   Complication of anesthesia    unable to void after surgery and had to stay overnight for several days w/ cath   Diabetes mellitus without complication [facility])    Headache    History of kidney stones    Hypertension    Kidney stone    Stroke [facility])     Past Surgical History:  Procedure Laterality Date   CYSTOSCOPY W/ RETROGRADES Right 11/14/2017   Procedure: CYSTOSCOPY WITH RETROGRADE PYELOGRAM;  Surgeon: Abbie Sons, MD;  Location: [facility] ORS;  Service: Urology;  Laterality: Right;   CYSTOSCOPY WITH STENT PLACEMENT Right 11/03/2017   Procedure: CYSTOSCOPY WITH STENT PLACEMENT;  Surgeon: Abbie Sons, MD;  Location: [facility] ORS;  Service: Urology;  Laterality: Right;   CYSTOSCOPY/RETROGRADE/URETEROSCOPY Right 11/03/2017   Procedure: CYSTOSCOPY/RETROGRADE/URETEROSCOPY;  Surgeon: Abbie Sons, MD;  Location: [facility] ORS;  Service: Urology;  Laterality: Right;   CYSTOSCOPY/URETEROSCOPY/HOLMIUM LASER/STENT PLACEMENT Right 11/14/2017   Procedure: CYSTOSCOPY/URETEROSCOPY/HOLMIUM LASER/STENT Exchange;  Surgeon: Abbie Sons, MD;  Location: [facility] ORS;  Service: Urology;  Laterality: Right;  stent exchange   EYE SURGERY Right    cataract extraction.  needed yag laser and still has problems    HAND SURGERY Right 1989   tendons came undone from bones in wrist. metal removed   HERNIA REPAIR Bilateral 2010   inguinal hernias   HERNIA REPAIR      Prior to Admission medications   Medication Sig Start  Date End Date Taking? Authorizing Provider  ELIQUIS 5 MG TABS tablet Take 5 mg by mouth 2 (two) times daily. 04/29/19  Yes [provider]  LEVEMIR 100 UNIT/ML injection Inject 25 Units into the skin every morning. 04/05/19  Yes [provider]  levothyroxine (SYNTHROID) 75 MCG tablet Take 75 mcg by mouth daily. 12/18/21  Yes [provider]  meclizine (ANTIVERT) 12.5 MG tablet Take 1 tablet (12.5 mg total) by mouth 3 (three) times daily as needed for dizziness. 09/14/21  Yes Drenda Freeze, MD  meloxicam (MOBIC) 7.5 MG tablet Take 7.5 mg by mouth daily. 12/09/21  Yes [provider]  mirtazapine (REMERON) 7.5 MG tablet Take 7.5 mg by mouth at bedtime.   Yes [provider]  omeprazole (PRILOSEC) 40 MG capsule Take 40 mg by mouth 2 (two) times daily before a meal. 04/16/19  Yes [provider]  venlafaxine XR (EFFEXOR-XR) 75 MG 24 hr capsule Take 75 mg by mouth daily. 03/04/22  Yes [provider]  Apoaequorin (PREVAGEN) 10 MG CAPS Take 10 mg by mouth daily. Patient not taking: Reported on 03/07/2022    [provider]  BD VEO INSULIN SYRINGE U/F 31G X 15/64" 0.3 ML MISC USE TO INJECT INSULIN 6 TIMES DAILY 04/05/19   [provider]  CONTOUR NEXT TEST test strip TEST 3 TIMES A DAY AS DIRECTED 04/06/19   [provider]  rosuvastatin (CRESTOR) 10 MG tablet Take 1 tablet (10  mg total) by mouth daily. Patient not taking: Reported on 03/07/2022 10/14/21 10/14/22  Garvin Fila, MD    Allergies as of 03/07/2022 - Review Complete 03/07/2022  Allergen Reaction Noted   Mirtazapine Other (See Comments) 05/13/2013    Family History  Problem Relation Age of Onset   Heart attack Father    Stroke Mother     Social History   Socioeconomic History   Marital status: Married    Spouse name: zane, wife   Number of children: Not on file   Years of education: Not on file   Highest education level: Not on file   Occupational History   Occupation: cattle farmer    Comment: still working  Tobacco Use   Smoking status: Never   Smokeless tobacco: Never  Vaping Use   Vaping Use: Never used  Substance and Sexual Activity   Alcohol use: No    Comment: rare   Drug use: No   Sexual activity: Yes    Birth control/protection: None  Other Topics Concern   Not on file  Social History Narrative   Not on file   Social Determinants of Health   Financial Resource Strain: Not on file  Food Insecurity: No Food Insecurity (03/07/2022)   Hunger Vital Sign    Worried About Running Out of Food in the Last Year: Never true    [place] in the Last Year: Never true  Transportation Needs: No Transportation Needs (03/07/2022)   PRAPARE - Hydrologist (Medical): No    Lack of Transportation (Non-Medical): No  Physical Activity: Not on file  Stress: Not on file  Social Connections: Not on file  Intimate Partner Violence: Not At Risk (03/07/2022)   Humiliation, Afraid, Rape, and Kick questionnaire    Fear of Current or Ex-Partner: No    Emotionally Abused: No    Physically Abused: No    Sexually Abused: No    Review of Systems: See HPI, otherwise negative ROS  Physical Exam: BP (!) 180/93   Pulse 73   Temp (!) 97.4 F (36.3 C) (Temporal)   Resp 18   Ht '6\' 2"'$  (1.88 m)   Wt 70.1 kg   SpO2 98%   BMI 19.84 kg/m  General:   Alert,  pleasant and cooperative in NAD Head:  Normocephalic and atraumatic. Neck:  Supple; no masses or thyromegaly. Lungs:  Clear throughout to auscultation, normal respiratory effort.    Heart:  +S1, +S2, Regular rate and rhythm, No edema. Abdomen:  Soft, nontender and nondistended. Normal bowel sounds, without guarding, and without rebound.   Neurologic:  Alert and  oriented x4;  grossly normal neurologically.  Impression/Plan: Kurt Hernandez is here for an endoscopy  to be performed for  evaluation of hematemesis    Risks, benefits,  limitations, and alternatives regarding endoscopy have been reviewed with the patient.  Questions have been answered.  All parties agreeable.   [name], MD  03/10/2022, 9:15 AM

## 2022-03-10 NOTE — Anesthesia Postprocedure Evaluation (Signed)
Anesthesia Post Note  Patient: Kurt Hernandez  Procedure(s) Performed: ESOPHAGOGASTRODUODENOSCOPY (EGD) WITH PROPOFOL  Patient location during evaluation: PACU Anesthesia Type: General Level of consciousness: awake and alert, oriented and patient cooperative Pain management: pain level controlled Vital Signs Assessment: post-procedure vital signs reviewed and stable Respiratory status: spontaneous breathing, nonlabored ventilation and respiratory function stable Cardiovascular status: blood pressure returned to baseline and stable Postop Assessment: adequate PO intake Anesthetic complications: no   No notable events documented.   Last Vitals:  Vitals:   03/10/22 1000 03/10/22 1008  BP: (!) 165/118 (!) 188/96  Pulse: 70 67  Resp: 19 (!) 23  Temp:    SpO2: 96%     Last Pain:  Vitals:   03/10/22 1008  TempSrc:   PainSc: 0-No pain                 Darrin Nipper

## 2022-03-10 NOTE — Care Management Important Message (Addendum)
Important Message  Patient Details  Name: Kurt Hernandez MRN: [id number] Date of Birth: September 05, 1940   Medicare Important Message Given:  Yes  Patient out of room upon time of visit, reviewed Medicare IM with wife in room.   Dannette Barbara 03/10/2022, 10:51 AM

## 2022-03-10 NOTE — Progress Notes (Addendum)
  PROGRESS NOTE    Kurt Hernandez  [id number] DOB: [birth year] DOA: 03/07/2022 PCP: Kurt Aus, MD  201A/201A-AA  LOS: 3 days   Brief hospital course:   Assessment & Plan: Kurt Hernandez is a 82 y.o. 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     Upper GI bleed Acute blood loss anemia (ABLA) Hgb 9.8 down from 114.2 in 12/2021  EGD today wnl Plan: --d/c PPI gtt --prep for colonoscopy tomorrow   AKI (acute kidney injury) - improving  Stage 3a chronic kidney disease  --improved with IVF   History of bilateral pulmonary embolism 2021 History of atrial fibrillation --hold Eliquis   History CVA Hyperlipidemia, mixed Pt statin intolerant    IDDM Hypoglycemia due to reduced oral intake Basal insulin Semglee 20 units daily PTA --pt became hypoglycemics after receiving home dose of long-acting insulin and being on clear liquid diet --s/p D5 infusion Plan: --hold glargine for now   GERD --d/c PPI gtt  Benign essential hypertension --no antihypertensive on home med list --IV hydralazine PRN  Pernicious anemia Diverticulosis Internal hemorrhoids    Coronary Artery Disease based on CT  Thoracic Aortic Ectasia    Lumbar radiculopathy   Acquired hypothyroidism Thyroid nodule  Continue synthroid   Severe malnutrition in context of chronic illness  --supplements per dietician   DVT prophylaxis: SCD/Compression stockings Code Status: Full code  Family Communication:  Level of care: Med-Surg Dispo:   The patient is from: home Anticipated d/c is to: home Anticipated d/c date is: 1-2 days   Subjective and Interval History:  EGD wnl.  Plan for colonoscopy  tomorrow (which pt initially refused).  Pt complained of being hungry for real food.   Objective: Vitals:   03/10/22 1038 03/10/22 1109 03/10/22 1319 03/10/22 1341  BP: (!) 174/85 (!) 178/83 (!) 192/93 (!) 192/93  Pulse:  67 81   Resp: 20 18    Temp:  (!) 97.5 F (36.4 C)    TempSrc:      SpO2:  100%    Weight:      Height:        Intake/Output Summary (Last 24 hours) at 03/10/2022 1439 Last data filed at 03/10/2022 1415 Gross per 24 hour  Intake 1560 ml  Output 1450 ml  Net 110 ml   Filed Weights   03/08/22 1654 03/09/22 0500 03/10/22 0530  Weight: 68 kg 70.8 kg 70.1 kg    Examination:   Constitutional: NAD, AAOx3 HEENT: conjunctivae and lids normal, EOMI CV: No cyanosis.   RESP: normal respiratory effort, on RA Neuro: II - XII grossly intact.   Psych: Normal mood and affect.  Appropriate judgement and reason   Data Reviewed: I have personally reviewed labs and imaging studies  Time spent: 35 minutes  Enzo Bi, MD [facility] If 7PM-7AM, please contact night-coverage 03/10/2022, 2:39 PM

## 2022-03-11 ENCOUNTER — Encounter: Payer: Self-pay | Admitting: Gastroenterology

## 2022-03-11 ENCOUNTER — Encounter
Admission: EM | Disposition: A | Discharge status: Home / Self Care | Payer: Self-pay | Source: Home / Self Care | Attending: Hospitalist

## 2022-03-11 ENCOUNTER — Encounter: Payer: Self-pay | Admitting: Anesthesiology

## 2022-03-11 DIAGNOSIS — D62 Acute posthemorrhagic anemia: Secondary | ICD-10-CM | POA: Diagnosis not present

## 2022-03-11 DIAGNOSIS — K922 Gastrointestinal hemorrhage, unspecified: Secondary | ICD-10-CM | POA: Diagnosis not present

## 2022-03-11 LAB — BASIC METABOLIC PANEL
Anion gap: 6 (ref 5–15)
BUN: 14 mg/dL (ref 8–23)
CO2: 25 mmol/L (ref 22–32)
Calcium: 8.9 mg/dL (ref 8.9–10.3)
Chloride: 105 mmol/L (ref 98–111)
Creatinine, Ser: 1.69 mg/dL — ABNORMAL HIGH (ref 0.61–1.24)
GFR, Estimated: 41 mL/min — ABNORMAL LOW (ref >60)
Glucose, Bld: 140 mg/dL — ABNORMAL HIGH (ref 70–99)
Potassium: 3.9 mmol/L (ref 3.5–5.1)
Sodium: 136 mmol/L (ref 135–145)

## 2022-03-11 LAB — CBC
HCT: 29.3 % — ABNORMAL LOW (ref 39.0–52.0)
Hemoglobin: 9.8 g/dL — ABNORMAL LOW (ref 13.0–17.0)
MCH: 31 pg (ref 26.0–34.0)
MCHC: 33.4 g/dL (ref 30.0–36.0)
MCV: 92.7 fL (ref 80.0–100.0)
Platelets: 252 10*3/uL (ref 150–400)
RBC: 3.16 MIL/uL — ABNORMAL LOW (ref 4.22–5.81)
RDW: 13.3 % (ref 11.5–15.5)
WBC: 6.5 10*3/uL (ref 4.0–10.5)
nRBC: 0 % (ref 0.0–0.2)

## 2022-03-11 LAB — GLUCOSE, CAPILLARY
Glucose-Capillary: 133 mg/dL — ABNORMAL HIGH (ref 70–99)
Glucose-Capillary: 178 mg/dL — ABNORMAL HIGH (ref 70–99)
Glucose-Capillary: 219 mg/dL — ABNORMAL HIGH (ref 70–99)

## 2022-03-11 LAB — MAGNESIUM: Magnesium: 1.7 mg/dL (ref 1.7–2.4)

## 2022-03-11 SURGERY — COLONOSCOPY WITH PROPOFOL
Anesthesia: General

## 2022-03-11 MED ORDER — ONDANSETRON 4 MG PO TBDP: 4.0000 mg | ORAL_TABLET | Freq: Three times a day (TID) | ORAL | 0 refills | Status: DC | PRN

## 2022-03-11 MED ORDER — SODIUM CHLORIDE 0.9 % IV SOLN
12.5000 mg | Freq: Four times a day (QID) | INTRAVENOUS | Status: DC | PRN
  Filled 2022-03-11: qty 0.5

## 2022-03-11 MED ORDER — ONDANSETRON HCL 4 MG/2ML IJ SOLN
4.0000 mg | Freq: Four times a day (QID) | INTRAMUSCULAR | Status: DC | PRN
  Administered 2022-03-11: 4 mg via INTRAVENOUS
  Filled 2022-03-11: qty 2

## 2022-03-11 MED ORDER — ONDANSETRON 4 MG PO TBDP: 4.0000 mg | ORAL_TABLET | Freq: Three times a day (TID) | ORAL | Status: DC | PRN

## 2022-03-11 NOTE — Anesthesia Preprocedure Evaluation (Deleted)
Anesthesia Evaluation  Patient identified by MRN, date of birth, ID band Patient awake    Reviewed: Allergy & Precautions, NPO status , Patient's Chart, lab work & pertinent test results  History of Anesthesia Complications Negative for: history of anesthetic complications  Airway Mallampati: IV   Neck ROM: Full    Dental  (+) Partial Upper, Lower Dentures, Implants   Pulmonary neg pulmonary ROS   Pulmonary exam normal breath sounds clear to auscultation       Cardiovascular hypertension, Normal cardiovascular exam+ dysrhythmias (a fib)  Rhythm:Regular Rate:Normal  Hx PE on Eliquis  ECG 09/15/21: SR with PACs; nonspecific ST abnormality; no STEMI  Echo 08/25/21:  NORMAL LEFT VENTRICULAR SYSTOLIC FUNCTION   WITH MILD LVH  NORMAL RIGHT VENTRICULAR SYSTOLIC FUNCTION  MILD VALVULAR REGURGITATION  NO VALVULAR STENOSIS  MILD MR, TR ,PR  TRIVIAL AR  EF 55-60%   Coronary Artery Disease based on CT   Thoracic Aortic Ectasia    Neuro/Psych  w/ residual difficulty w/ balance and memory  Neuromuscular disease (neuropathy) CVA, No Residual Symptoms    GI/Hepatic ,GERD  ,,GI Bleed   Endo/Other  diabetes, Type 2Hypothyroidism    Renal/GU Renal disease (nephrolithiasis; stage III CKD)     Musculoskeletal   Abdominal   Peds  Hematology  (+) Blood dyscrasia, anemia Pernicious anemia   Anesthesia Other Findings   Reproductive/Obstetrics                              Anesthesia Physical Anesthesia Plan  ASA: 3  Anesthesia Plan: General   Post-op Pain Management:    Induction: Intravenous  PONV Risk Score and Plan: 2 and Propofol infusion, TIVA and Treatment may vary due to age or medical condition  Airway Management Planned: Natural Airway  Additional Equipment:   Intra-op Plan:   Post-operative Plan:   Informed Consent:   Plan Discussed with: CRNA  Anesthesia Plan Comments:           Anesthesia Quick Evaluation

## 2022-03-11 NOTE — Progress Notes (Signed)
[name] , MD [address] [place], [place], [place], [address] [address], [place], [place], [place], [address] Phone: [phone]  Fax: [phone]   Kurt Hernandez is being followed for melena    Subjective:   Plan was for endoscopy today he was unable to complete the prep due to significant nausea feels exhausted as he has not had any food to eat his stool still brown in color denies any blood or black stool.   Objective: Vital signs in last 24 hours: Vitals:   03/11/22 0440 03/11/22 0452 03/11/22 0631 03/11/22 0813  BP: (!) 174/103  (!) 146/88 (!) 176/88  Pulse: 85  (!) 107 94  Resp:    18  Temp:    (!) 97.3 F (36.3 C)  TempSrc:      SpO2:    98%  Weight:  73.3 kg    Height:       Weight change: 3.175 kg  Intake/Output Summary (Last 24 hours) at 03/11/2022 1108 Last data filed at 03/11/2022 0306 Gross per 24 hour  Intake 900 ml  Output --  Net 900 ml     Exam: Neuro: Appears exhausted and tired ANO x 3 moving all 4 limbs no gross focal neurological deficit   Lab Results: '@LABTEST2'$ @ Micro Results: No results found for this or any previous visit (from the past 240 hour(s)). Studies/Results: No results found. Medications: I have reviewed the patient's current medications. Scheduled Meds:  feeding supplement  1 Container Oral TID BM   insulin aspart  0-5 Units Subcutaneous QHS   insulin aspart  0-6 Units Subcutaneous TID WC   levothyroxine  100 mcg Oral Daily   mirtazapine  7.5 mg Oral QHS   venlafaxine XR  75 mg Oral Daily   Continuous Infusions:  sodium chloride     promethazine (PHENERGAN) injection (IM or IVPB)     PRN Meds:.acetaminophen **OR** acetaminophen, hydrALAZINE, ondansetron (ZOFRAN) IV, ondansetron, promethazine (PHENERGAN) injection (IM or IVPB)   Assessment: Principal Problem:   UGIB (upper gastrointestinal bleed) Active Problems:   Acquired hypothyroidism   Benign essential hypertension   Lumbar radiculopathy    Pernicious anemia   History of bilateral pulmonary embolism 2021   History of atrial fibrillation   Hyperlipidemia, mixed   Stage 3a chronic kidney disease (CKD) ([place])   AKI (acute kidney injury) ([place])   Acute blood loss anemia (ABLA)   GI bleed   Protein-calorie malnutrition, severe  Kurt Hernandez is a 82 y.o. y/o male present to the emergency room with 1 episode of emesis and bloody stools for the past few days.  He is on Eliquis.  Last dose taken yesterday 03/07/2018 3 in the morning   Plan 1.  EGD showed no abnormalities.  Plan was for colonoscopy today he was unable to complete the prep due to nausea and exertion but he does state that his stool is brown in color denies any brown or black stools.  He does not feel he can go through the procedure at this point he would like to eat and drink and go home.  I discussed with Dr. Billie Ruddy about his Eliquis, unfortunately we do not know where he has bled but we cannot say for now he is not bleeding as his stool is brown in color.  Discussed that decision about future Eliquis use would be of benefit risk ratio to determine if the benefits of staying on Eliquis outweigh the risks.  If the benefits are very  low could consider aspirin as an alternative if possible otherwise would need to start on Eliquis with very close monitoring and instructions to the patient that if he were to bleed he needs to come to the emergency room right away.  Either way he should have a CBC with his PCP and follow-up in 5 to 10 days time.  He can return to the GI office when he is ready for a colonoscopy 2.   History of hematuria which may require further evaluation  I will sign off.  Please call me if any further GI concerns or questions.  We would like to thank you for the opportunity to participate in the care of Kurt Hernandez.    LOS: 4 days   [name], MD 03/11/2022, 11:08 AM

## 2022-03-11 NOTE — Discharge Summary (Signed)
Physician Discharge Summary   Kurt Hernandez  male DOB: 1940-02-15  [id number]  PCP: Rusty Aus, MD  Admit date: 03/07/2022 Discharge date: 03/11/2022  Admitted From: home Disposition:  home CODE STATUS: Full code  Discharge Instructions     Diet Carb Modified   Complete by: As directed    Discharge instructions   Complete by: As directed    Since you chose to have colonoscopy done as outpatient, we haven't found a bleeding source yet.  Since you have a need to be on Eliquis, please resume Eliquis and monitor yourself.  If you have black stools or further bleeding, then please return to the hospital.  Please follow up with your primary care doctor in 5-10 days to check your hemoglobin.   Dr. Enzo Bi [facility] Course:  For full details, please see H&P, progress notes, consult notes and ancillary notes.  Briefly,  Kurt Hernandez is a 82 y.o. 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 Pt reported dark bloody stool over the past week.  Pt also stated 1 occurrence of bright red blood in emesis and bright red blood in urine.    Upper GI bleed ruled out Acute on chronic anemia --Hgb 9.8 down from 114.2 in 12/2021  --Pt initially refused colonoscopy, however, after EGD came back wnl, pt agreed to prep for colonoscopy.  Pt wasn't able to finish bowel prep and felt too weak to go through with the procedure, so pt elected to go home and plan for outpatient colonoscopy. --Hgb stable around 9's prior to discharge. --Discussed with GI about anticoagulation.  Since a bleeding source hasn't been found and pt has a need for anticoagulation, decision was made for pt to resume home Eliquis and pt was advised to monitor for black stools or further  bleeding. --f/u with PCP in 5-10 days for Hgb check.   AKI (acute kidney injury) - improving  Stage 3a chronic kidney disease  --Cr 2.37 on presentation, improved with IVF, was 1.69 prior to discharge.   History of bilateral pulmonary embolism 2021 History of atrial fibrillation --Eliquis held during hospitalization.   --Discussed with GI.  Since a bleeding source hasn't been found and pt has a need for anticoagulation, decision was made for pt to resume home Eliquis and pt was advised to monitor for black stools or further bleeding.   History CVA Hyperlipidemia, mixed Pt statin intolerant    IDDM Hypoglycemia due to reduced oral intake Basal insulin Semglee 20 units daily PTA --pt became hypoglycemics after receiving home dose of long-acting insulin and being on clear liquid diet to prep for GI procedures. --pt was discharged back on home insulin regimen as below.   GERD --PPI gtt d/c'ed after EGD returned normal. --cont home PPI   Benign essential hypertension --no antihypertensive on home med list   Pernicious anemia Diverticulosis Internal hemorrhoids    Coronary Artery Disease based on CT   Thoracic Aortic Ectasia    Lumbar radiculopathy   Acquired hypothyroidism Thyroid nodule  Continue synthroid    Severe malnutrition in context of chronic illness  --supplements per dietician   Unless noted above, medications under "STOP" list are ones pt was not taking PTA.  Discharge Diagnoses:  Principal  Problem:   UGIB (upper gastrointestinal bleed) Active Problems:   Acquired hypothyroidism   Benign essential hypertension   Lumbar radiculopathy   Pernicious anemia   History of bilateral pulmonary embolism 2021   History of atrial fibrillation   Hyperlipidemia, mixed   Stage 3a chronic kidney disease (CKD) (HCC)   AKI (acute kidney injury) ([facility])   Acute blood loss anemia   GI bleed   Protein-calorie malnutrition, severe   30 Day Unplanned Readmission Risk  Score    Flowsheet Row ED to Hosp-Admission (Current) from 03/07/2022 in [facility]  30 Day Unplanned Readmission Risk Score (%) 9.21 Filed at 03/11/2022 0801       This score is the patient's risk of an unplanned readmission within 30 days of being discharged (0 -100%). The score is based on dignosis, age, lab data, medications, orders, and past utilization.   Low:  0-14.9   Medium: 15-21.9   High: 22-29.9   Extreme: 30 and above         Discharge Instructions:  Allergies as of 03/11/2022       Reactions   Mirtazapine Other (See Comments)   Other reaction(s): Dizziness-per wife, Patient has been tolerating 7.5 mg without dizziness.        Medication List     STOP taking these medications    Prevagen 10 MG Caps Generic drug: Apoaequorin   rosuvastatin 10 MG tablet Commonly known as: Crestor       TAKE these medications    BD Veo Insulin Syringe U/F 31G X 15/64" 0.3 ML Misc Generic drug: Insulin Syringe-Needle U-100 USE TO INJECT INSULIN 6 TIMES DAILY   Contour Next Test test strip Generic drug: glucose blood TEST 3 TIMES A DAY AS DIRECTED   Eliquis 5 MG Tabs tablet Generic drug: apixaban Take 5 mg by mouth 2 (two) times daily.   Levemir 100 UNIT/ML injection Generic drug: insulin detemir Inject 25 Units into the skin every morning.   levothyroxine 75 MCG tablet Commonly known as: SYNTHROID Take 75 mcg by mouth daily.   meclizine 12.5 MG tablet Commonly known as: ANTIVERT Take 1 tablet (12.5 mg total) by mouth 3 (three) times daily as needed for dizziness.   meloxicam 7.5 MG tablet Commonly known as: MOBIC Take 7.5 mg by mouth daily.   mirtazapine 7.5 MG tablet Commonly known as: REMERON Take 7.5 mg by mouth at bedtime.   omeprazole 40 MG capsule Commonly known as: PRILOSEC Take 40 mg by mouth 2 (two) times daily before a meal.   ondansetron 4 MG disintegrating tablet Commonly known as: ZOFRAN-ODT Take 1  tablet (4 mg total) by mouth every 8 (eight) hours as needed for nausea or vomiting.   venlafaxine XR 75 MG 24 hr capsule Commonly known as: EFFEXOR-XR Take 75 mg by mouth daily.         Follow-up Information     Rusty Aus, MD Follow up in 1 week(s).   Specialty: Internal Medicine Why: check hemoglobin Contact information: [place] [address] [phone]         [name], MD Follow up.   Specialty: Gastroenterology Why: for colonoscopy Contact information: [address] [place] [address] [phone]                 Allergies  Allergen Reactions   Mirtazapine Other (See Comments)    Other reaction(s): Dizziness-per wife, Patient has been  tolerating 7.5 mg without dizziness.     The results of significant diagnostics from this hospitalization (including imaging, microbiology, ancillary and laboratory) are listed below for reference.   Consultations:   Procedures/Studies: No results found.    Labs: BNP (last 3 results) No results for input(s): "BNP" in the last 8760 hours. Basic Metabolic Panel: Recent Labs  Lab 03/07/22 1358 03/08/22 0332 03/09/22 0342 03/10/22 0248 03/11/22 0421  NA 135 136 134* 135 136  K 4.1 4.2 3.4* 4.9 3.9  CL 98 103 103 105 105  CO2 '29 26 25 27 25  '$ GLUCOSE 196* 86 52* 157* 140*  BUN 32* 25* '20 16 14  '$ CREATININE 2.37* 1.88* 1.70* 1.78* 1.69*  CALCIUM 9.4 8.6* 8.8* 8.9 8.9  MG  --   --   --  1.9 1.7   Liver Function Tests: Recent Labs  Lab 03/07/22 1358 03/09/22 0342  AST 18 14*  ALT 14 12  ALKPHOS 69 59  BILITOT 0.5 0.4  PROT 6.2* 5.2*  ALBUMIN 3.3* 2.9*   No results for input(s): "LIPASE", "AMYLASE" in the last 168 hours. No results for input(s): "AMMONIA" in the last 168 hours. CBC: Recent Labs  Lab 03/07/22 1358 03/07/22 1715 03/08/22 0332 03/08/22 1208 03/08/22 1954 03/09/22 0342 03/10/22 0248  03/11/22 0421  WBC 7.2  --  6.5  --   --  6.4 5.6 6.5  HGB 9.8*   < > 8.7* 8.5* 8.6* 8.9* 9.1* 9.8*  HCT 29.4*   < > 25.8* 25.1* 25.8* 26.0* 26.9* 29.3*  MCV 93.9  --  91.8  --   --  91.2 91.2 92.7  PLT 256  --  242  --   --  244 231 252   < > = values in this interval not displayed.   Cardiac Enzymes: No results for input(s): "CKTOTAL", "CKMB", "CKMBINDEX", "TROPONINI" in the last 168 hours. BNP: Invalid input(s): "POCBNP" CBG: Recent Labs  Lab 03/10/22 1517 03/10/22 2139 03/11/22 0407 03/11/22 0654 03/11/22 0753  GLUCAP 326* 125* 133* 178* 219*   D-Dimer No results for input(s): "DDIMER" in the last 72 hours. Hgb A1c No results for input(s): "HGBA1C" in the last 72 hours. Lipid Profile No results for input(s): "CHOL", "HDL", "LDLCALC", "TRIG", "CHOLHDL", "LDLDIRECT" in the last 72 hours. Thyroid function studies No results for input(s): "TSH", "T4TOTAL", "T3FREE", "THYROIDAB" in the last 72 hours.  Invalid input(s): "FREET3" Anemia work up No results for input(s): "VITAMINB12", "FOLATE", "FERRITIN", "TIBC", "IRON", "RETICCTPCT" in the last 72 hours. Urinalysis    Component Value Date/Time   COLORURINE YELLOW 09/14/2021 1652   APPEARANCEUR HAZY (A) 09/14/2021 1652   APPEARANCEUR Clear 06/26/2019 1040   LABSPEC 1.025 09/14/2021 1652   PHURINE 5.0 09/14/2021 1652   GLUCOSEU 150 (A) 09/14/2021 1652   HGBUR NEGATIVE 09/14/2021 1652   BILIRUBINUR NEGATIVE 09/14/2021 1652   BILIRUBINUR Negative 06/26/2019 1040   KETONESUR NEGATIVE 09/14/2021 1652   PROTEINUR 30 (A) 09/14/2021 1652   NITRITE NEGATIVE 09/14/2021 1652   LEUKOCYTESUR NEGATIVE 09/14/2021 1652   Sepsis Labs Recent Labs  Lab 03/08/22 0332 03/09/22 0342 03/10/22 0248 03/11/22 0421  WBC 6.5 6.4 5.6 6.5   Microbiology No results found for this or any previous visit (from the past 240 hour(s)).   Total time spend on discharging this patient, including the last patient exam, discussing the hospital  stay, instructions for ongoing care as it relates to all pertinent caregivers, as well as preparing the medical discharge records, prescriptions, and/or referrals as  applicable, is 40 minutes.    Enzo Bi, MD  [facility] 03/11/2022, 11:16 AM

## 2022-04-28 ENCOUNTER — Ambulatory Visit: Payer: Medicare Other | Admitting: Neurology

## 2022-06-13 ENCOUNTER — Encounter (HOSPITAL_COMMUNITY): Payer: Self-pay

## 2022-06-13 ENCOUNTER — Emergency Department (HOSPITAL_COMMUNITY): Payer: Medicare Other

## 2022-06-13 ENCOUNTER — Other Ambulatory Visit: Payer: Self-pay

## 2022-06-13 ENCOUNTER — Observation Stay (HOSPITAL_COMMUNITY)
Admission: EM | Admit: 2022-06-13 | Discharge: 2022-06-16 | Disposition: A | Discharge status: Home / Self Care | Payer: Medicare Other | Attending: Internal Medicine | Admitting: Internal Medicine

## 2022-06-13 DIAGNOSIS — N179 Acute kidney failure, unspecified: Secondary | ICD-10-CM | POA: Insufficient documentation

## 2022-06-13 DIAGNOSIS — Z85828 Personal history of other malignant neoplasm of skin: Secondary | ICD-10-CM | POA: Insufficient documentation

## 2022-06-13 DIAGNOSIS — M6281 Muscle weakness (generalized): Secondary | ICD-10-CM | POA: Insufficient documentation

## 2022-06-13 DIAGNOSIS — R4789 Other speech disturbances: Secondary | ICD-10-CM | POA: Insufficient documentation

## 2022-06-13 DIAGNOSIS — Z79899 Other long term (current) drug therapy: Secondary | ICD-10-CM | POA: Insufficient documentation

## 2022-06-13 DIAGNOSIS — E1122 Type 2 diabetes mellitus with diabetic chronic kidney disease: Secondary | ICD-10-CM | POA: Insufficient documentation

## 2022-06-13 DIAGNOSIS — R2689 Other abnormalities of gait and mobility: Secondary | ICD-10-CM | POA: Diagnosis not present

## 2022-06-13 DIAGNOSIS — I639 Cerebral infarction, unspecified: Secondary | ICD-10-CM | POA: Diagnosis not present

## 2022-06-13 DIAGNOSIS — Z8673 Personal history of transient ischemic attack (TIA), and cerebral infarction without residual deficits: Secondary | ICD-10-CM | POA: Diagnosis not present

## 2022-06-13 DIAGNOSIS — R2681 Unsteadiness on feet: Secondary | ICD-10-CM | POA: Diagnosis not present

## 2022-06-13 DIAGNOSIS — Z86718 Personal history of other venous thrombosis and embolism: Secondary | ICD-10-CM | POA: Insufficient documentation

## 2022-06-13 DIAGNOSIS — Z7901 Long term (current) use of anticoagulants: Secondary | ICD-10-CM | POA: Diagnosis not present

## 2022-06-13 DIAGNOSIS — R262 Difficulty in walking, not elsewhere classified: Secondary | ICD-10-CM | POA: Insufficient documentation

## 2022-06-13 DIAGNOSIS — I480 Paroxysmal atrial fibrillation: Secondary | ICD-10-CM | POA: Diagnosis not present

## 2022-06-13 DIAGNOSIS — N1832 Chronic kidney disease, stage 3b: Secondary | ICD-10-CM | POA: Diagnosis not present

## 2022-06-13 DIAGNOSIS — Z794 Long term (current) use of insulin: Secondary | ICD-10-CM | POA: Insufficient documentation

## 2022-06-13 DIAGNOSIS — E039 Hypothyroidism, unspecified: Secondary | ICD-10-CM | POA: Insufficient documentation

## 2022-06-13 DIAGNOSIS — R4182 Altered mental status, unspecified: Secondary | ICD-10-CM

## 2022-06-13 LAB — COMPREHENSIVE METABOLIC PANEL
ALT: 12 U/L (ref 0–44)
AST: 14 U/L — ABNORMAL LOW (ref 15–41)
Albumin: 3.3 g/dL — ABNORMAL LOW (ref 3.5–5.0)
Alkaline Phosphatase: 71 U/L (ref 38–126)
Anion gap: 6 (ref 5–15)
BUN: 33 mg/dL — ABNORMAL HIGH (ref 8–23)
CO2: 24 mmol/L (ref 22–32)
Calcium: 9.4 mg/dL (ref 8.9–10.3)
Chloride: 103 mmol/L (ref 98–111)
Creatinine, Ser: 2.21 mg/dL — ABNORMAL HIGH (ref 0.61–1.24)
GFR, Estimated: 29 mL/min — ABNORMAL LOW (ref >60)
Glucose, Bld: 292 mg/dL — ABNORMAL HIGH (ref 70–99)
Potassium: 4 mmol/L (ref 3.5–5.1)
Sodium: 133 mmol/L — ABNORMAL LOW (ref 135–145)
Total Bilirubin: 0.8 mg/dL (ref 0.3–1.2)
Total Protein: 6.4 g/dL — ABNORMAL LOW (ref 6.5–8.1)

## 2022-06-13 LAB — CBC
HCT: 34 % — ABNORMAL LOW (ref 39.0–52.0)
Hemoglobin: 11.6 g/dL — ABNORMAL LOW (ref 13.0–17.0)
MCH: 30.3 pg (ref 26.0–34.0)
MCHC: 34.1 g/dL (ref 30.0–36.0)
MCV: 88.8 fL (ref 80.0–100.0)
Platelets: 181 10*3/uL (ref 150–400)
RBC: 3.83 MIL/uL — ABNORMAL LOW (ref 4.22–5.81)
RDW: 12.2 % (ref 11.5–15.5)
WBC: 5.8 10*3/uL (ref 4.0–10.5)
nRBC: 0 % (ref 0.0–0.2)

## 2022-06-13 LAB — CBG MONITORING, ED: Glucose-Capillary: 302 mg/dL — ABNORMAL HIGH (ref 70–99)

## 2022-06-13 NOTE — ED Triage Notes (Signed)
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 Today he has been confused and just not himself. Wife explains that pt has been off, and walking slow, gets disoriented when doing tasks. Pt also reports possible UTI. PT urine is dark with red tint. No fever or chills.

## 2022-06-14 ENCOUNTER — Emergency Department (HOSPITAL_COMMUNITY): Payer: Medicare Other

## 2022-06-14 ENCOUNTER — Observation Stay (HOSPITAL_BASED_OUTPATIENT_CLINIC_OR_DEPARTMENT_OTHER): Payer: Medicare Other

## 2022-06-14 ENCOUNTER — Encounter (HOSPITAL_COMMUNITY): Payer: PRIVATE HEALTH INSURANCE

## 2022-06-14 DIAGNOSIS — I639 Cerebral infarction, unspecified: Secondary | ICD-10-CM | POA: Diagnosis present

## 2022-06-14 DIAGNOSIS — I6389 Other cerebral infarction: Secondary | ICD-10-CM | POA: Diagnosis not present

## 2022-06-14 DIAGNOSIS — I6523 Occlusion and stenosis of bilateral carotid arteries: Secondary | ICD-10-CM

## 2022-06-14 LAB — URINALYSIS, ROUTINE W REFLEX MICROSCOPIC
Bacteria, UA: NONE SEEN
Bilirubin Urine: NEGATIVE
Glucose, UA: >=500 mg/dL — AB
Hgb urine dipstick: NEGATIVE
Ketones, ur: NEGATIVE mg/dL
Leukocytes,Ua: NEGATIVE
Nitrite: NEGATIVE
Protein, ur: NEGATIVE mg/dL
Specific Gravity, Urine: 1.012 (ref 1.005–1.030)
pH: 5 (ref 5.0–8.0)

## 2022-06-14 LAB — CBG MONITORING, ED
Glucose-Capillary: 256 mg/dL — ABNORMAL HIGH (ref 70–99)
Glucose-Capillary: 217 mg/dL — ABNORMAL HIGH (ref 70–99)

## 2022-06-14 LAB — GLUCOSE, CAPILLARY
Glucose-Capillary: 173 mg/dL — ABNORMAL HIGH (ref 70–99)
Glucose-Capillary: 187 mg/dL — ABNORMAL HIGH (ref 70–99)

## 2022-06-14 LAB — ECHOCARDIOGRAM COMPLETE
Area-P 1/2: 2.15 cm2
Height: 74 in
S' Lateral: 3.1 cm
Weight: 2560 oz

## 2022-06-14 MED ORDER — ACETAMINOPHEN 325 MG PO TABS: 650.0000 mg | ORAL_TABLET | ORAL | Status: DC | PRN

## 2022-06-14 MED ORDER — INSULIN GLARGINE-YFGN 100 UNIT/ML ~~LOC~~ SOLN
20.0000 [IU] | Freq: Every day | SUBCUTANEOUS | Status: DC
  Administered 2022-06-14: 20 [IU] via SUBCUTANEOUS
  Filled 2022-06-14 (×3): qty 0.2

## 2022-06-14 MED ORDER — ROSUVASTATIN CALCIUM 5 MG PO TABS
10.0000 mg | ORAL_TABLET | Freq: Every day | ORAL | Status: DC
  Filled 2022-06-14 (×2): qty 2

## 2022-06-14 MED ORDER — APIXABAN 2.5 MG PO TABS: 2.5000 mg | ORAL_TABLET | Freq: Two times a day (BID) | ORAL | Status: DC

## 2022-06-14 MED ORDER — ACETAMINOPHEN 160 MG/5ML PO SOLN: 650.0000 mg | ORAL | Status: DC | PRN

## 2022-06-14 MED ORDER — SODIUM CHLORIDE 0.9 % IV BOLUS
1000.0000 mL | Freq: Once | INTRAVENOUS | Status: AC
  Administered 2022-06-14: 1000 mL via INTRAVENOUS

## 2022-06-14 MED ORDER — STROKE: EARLY STAGES OF RECOVERY BOOK
Freq: Once | Status: AC
  Filled 2022-06-14: qty 1

## 2022-06-14 MED ORDER — ACETAMINOPHEN 650 MG RE SUPP: 650.0000 mg | RECTAL | Status: DC | PRN

## 2022-06-14 MED ORDER — ENSURE ENLIVE PO LIQD
237.0000 mL | Freq: Two times a day (BID) | ORAL | Status: DC
  Administered 2022-06-14 – 2022-06-15 (×2): 237 mL via ORAL
  Filled 2022-06-14: qty 237

## 2022-06-14 MED ORDER — SENNOSIDES-DOCUSATE SODIUM 8.6-50 MG PO TABS: 1.0000 | ORAL_TABLET | Freq: Every evening | ORAL | Status: DC | PRN

## 2022-06-14 MED ORDER — SODIUM CHLORIDE 0.9 % IV BOLUS: 500.0000 mL | Freq: Once | INTRAVENOUS | Status: DC

## 2022-06-14 MED ORDER — INSULIN ASPART 100 UNIT/ML IJ SOLN
0.0000 [IU] | Freq: Three times a day (TID) | INTRAMUSCULAR | Status: DC
  Administered 2022-06-14: 4 [IU] via SUBCUTANEOUS
  Administered 2022-06-14: 3 [IU] via SUBCUTANEOUS

## 2022-06-14 MED ORDER — LEVOTHYROXINE SODIUM 75 MCG PO TABS
75.0000 ug | ORAL_TABLET | Freq: Every day | ORAL | Status: DC
  Administered 2022-06-14 – 2022-06-16 (×3): 75 ug via ORAL
  Filled 2022-06-14 (×3): qty 1

## 2022-06-14 MED ORDER — PANTOPRAZOLE SODIUM 40 MG PO TBEC
40.0000 mg | DELAYED_RELEASE_TABLET | Freq: Every day | ORAL | Status: DC
  Administered 2022-06-14 – 2022-06-16 (×3): 40 mg via ORAL
  Filled 2022-06-14 (×2): qty 1

## 2022-06-14 MED ORDER — APIXABAN 5 MG PO TABS
5.0000 mg | ORAL_TABLET | Freq: Two times a day (BID) | ORAL | Status: DC
  Administered 2022-06-14: 5 mg via ORAL
  Filled 2022-06-14: qty 1

## 2022-06-14 NOTE — ED Notes (Signed)
Patient transported to MRI 

## 2022-06-14 NOTE — ED Notes (Signed)
Pt decided to not leave.

## 2022-06-14 NOTE — H&P (Cosign Needed Addendum)
NAME:  Kurt Hernandez, MRN:  [id number], DOB:  03-29-1940, LOS: 0 ADMISSION DATE:  06/13/2022, Primary: Kurt Penton, MD  CHIEF COMPLAINT:  altered mental status   Medical Service: Internal Medicine Teaching Service         Attending Physician: Dr. Inez Catalina, MD    First Contact: Dr. Geraldo Hernandez Pager: [phone]  Second Contact: Dr. Marijo Hernandez Pager: [phone]       After Hours (After 5p/  First Contact Pager: [phone]  weekends / holidays): Second Contact Pager: [phone]   HISTORY OF PRESENT ILLNESS   Kurt Hernandez is 82yo person with multiple previous cerebral infarctions, paroxysmal atrial fibrillation on Eliquis, history of pulmonary embolism, chronic kidney disease stage IIIb, hypertension, type II diabetes mellitus, hypothyroidism, GERD presenting to [facility] last night for altered mental status. History provided by both patient and son, who is at bedside. Patient was seen in his normal state of health by family on 6/2. Yesterday around 12:30 patient's family noticed he was not acting like himself. He was slower to answer questions and was "looking straight through you" when talking with him. The patient does not currently recall much of the events of yesterday. As far as he knows, he has not experienced any new symptoms over the last few days. 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 Patient denies any recent fevers, chills, nausea, vomiting, dyspnea, palpitations, abdominal pain, constipation, diarrhea, dysuria, polyuria, focal weakness/numbness. Patient's son and patient both agree he is back to his usual baseline at time of interview.    Patient and son provide further history that he has had ongoing issues with multiple strokes since 2021 when he was diagnosed with COVID-19. Overall patient has reportedly lost roughly 80lbs over the last few years despite a good appetite. He denies any night sweats, any GI  bleeding since last hospitalization, fevers, chills. When questioned regarding medications, patient reported taking daily ivermectin to help with deconditioning.   PCP: Kurt Penton, MD  ED COURSE   Patient initially arrived last night afebrile, mildly bradycardic to 57, normotensive, sating well on room air. Initial lab work revealed mild AKI and chronic anemia. MRI revealed acute infarct in the right basal ganglia. IMTS subsequently consulted for admission.  PAST MEDICAL HISTORY   He,  has a past medical history of Anemia, Arrhythmia, Cancer (HCC), Complication of anesthesia, Diabetes mellitus without complication (HCC), Headache, History of kidney stones, Hypertension, Kidney stone, and Stroke (HCC).   HOME MEDICATIONS   Omeprazole 40mg  daily Ivermectin 15mg  daily Levothyroxine Apixaban 2.5mg  twice daily  Amlodipine 5mg  daily Basaglar 20u daily (Confirmed via patient's wife over the phone)  ALLERGIES   Allergies as of 06/13/2022 - Review Complete 06/13/2022  Allergen Reaction Noted   Mirtazapine Other (See Comments) 05/13/2013    SOCIAL HISTORY   Patient currently lives near [place] with his wife. He typically can complete most of his ADL's and IADL's, although his wife helps him with his medications. He typically uses a cane for ambulation. Previous tobacco use, last quit ~50 years ago. No alcohol or drug use.   FAMILY HISTORY   His family history includes Heart attack in his father; Stroke in his mother.   REVIEW OF SYSTEMS   ROS per history of present illness.  PHYSICAL EXAMINATION   Blood pressure (!) 154/85, pulse (!) 57, temperature 98.4 F (36.9 C), temperature source Oral, resp. rate 13,  height 6\' 2"  (1.88 m), weight 72.6 kg, SpO2 100 %.    Filed Weights   06/13/22 2018  Weight: 72.6 kg   GENERAL: Elderly appearing person laying in bed in no acute distress HENT: Normocephalic, atraumatic. Dry mucous membranes. No cervical or supraclavicular  lymphadenopathy.  EYES: Vision grossly in tact. No scleral icterus or conjunctival injection.  CV: Regular rate, rhythm. No murmurs appreciated. Warm extremities.  PULM: Normal work of breathing on room air. Clear to auscultation bilaterally.  GI: Abdomen soft, non-tender, non-distended. Normoactive bowel sounds.  MSK: Thin. No peripheral edema. SKIN: Warm, dry. No rashes or lesions.  NEURO: Awake, alert, conversing appropriately. Slow to answer questions. Cranial nerves in tact. Strength 5/5 throughout. Sensation in tact upper extremities. Decreased sensation in toes bilaterally, otherwise in tact more proximally bilaterally. Finger to nose bilaterally normal.  PSYCH: Normal mood, affect, speech.   SIGNIFICANT DIAGNOSTIC TESTS   MRI BRAIN WO CONTRAST MRA HEAD 1. Acute infarct in the right basal ganglia. No acute hemorrhage or significant mass effect. 2. Mild stenosis of the right MCA, M1 segment.  LABS      Latest Ref Rng & Units 06/13/2022    8:31 PM 03/11/2022    4:21 AM 03/10/2022    2:48 AM  CBC  WBC 4.0 - 10.5 K/uL 5.8  6.5  5.6   Hemoglobin 13.0 - 17.0 g/dL 16.1  9.8  9.1   Hematocrit 39.0 - 52.0 % 34.0  29.3  26.9   Platelets 150 - 400 K/uL 181  252  231       Latest Ref Rng & Units 06/13/2022    8:31 PM 03/11/2022    4:21 AM 03/10/2022    2:48 AM  BMP  Glucose 70 - 99 mg/dL 096  045  409   BUN 8 - 23 mg/dL 33  14  16   Creatinine 0.61 - 1.24 mg/dL 8.11  9.14  7.82   Sodium 135 - 145 mmol/L 133  136  135   Potassium 3.5 - 5.1 mmol/L 4.0  3.9  4.9   Chloride 98 - 111 mmol/L 103  105  105   CO2 22 - 32 mmol/L 24  25  27    Calcium 8.9 - 10.3 mg/dL 9.4  8.9  8.9     CONSULTS   neurology  ASSESSMENT   Kurt Hernandez is 82yo person with multiple previous cerebral infarctions, paroxysmal atrial fibrillation on Eliquis, history of pulmonary embolism, chronic kidney disease stage IIIb, hypertension, type II diabetes mellitus, hypothyroidism, GERD  PLAN   Principal  Problem:   Acute cerebrovascular accident (CVA) (HCC)  #Acute cerebral infarct in R basal ganglia  Patient initially had mild encephalopathy that had resolved by the time he arrived to [facility] last night. Imaging overnight revealed an acute infarct in R basal ganglia. He has had multiple strokes in the past dating back to 2021. These are in the setting of uncontrolled hypertension, insulin-dependent diabetes mellitus, and paroxysmal atrial fibrillation. Neurology has been consulted, appreciate their input. It looks like Kurt Hernandez was on aspirin at one point, but this was discontinued in the fall and he was to continue Eliquis for stroke prevention. Given his recent GI bleed, deconditioning, age, will need to discuss risk/benefits of multiple anticoagulation and antiplatelet. He did receive a dose of anticoagulation this morning in the ED, will hold off antiplatelet medication for now. He was previously prescribed rosuvastatin, I do not see a contraindication to this for now, will re-start this.  Imaging this morning did not include carotids, given his AKI will avoid contrast and obtain ultrasound of carotids.  - Discuss anticoagulation/antiplatelet with neurology (currently anticoagulated, last dose Eliquis this AM)  -Note patient would qualify for reduced dose of Eliquis given age, renal function - Re-start rosuvastatin 10mg  daily - Follow-up carotid ultrasound - Follow-up Echocardiogram  - Follow-up A1c, lipid panel - Permissive hypertension for now, call MD SBP >220 - PT/OT/SLP - Telemetry  #Uncontrolled hypertension Per patient's wife, patient only takes amlodipine at home. Given his recurrent strokes he likely would benefit from a more aggressive approach, although will have to keep in mind age/fall risk. Will hold for permissive hypertension for now. - Hold home amlodipine  #Type II diabetes mellitus Last A1c 7.0% two months ago. Currently on long-acting insulin at home, we will continue  with this here. 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 He did receive some fluids in the ED. He is taking PO well. We will repeat labs in the AM. - Repeat RFP in AM - Strict I/O  #Paroxysmal atrial fibrillation Will need to continue anticoagulation, albeit at the reduced dose. Will discuss with neurology regarding continuing this in the setting of an acute CVA.  - Discuss with neurology when to re-start home Eliquis 2.5mg  twice daily  #Anemia of chronic disease #History of GI bleed, unknown source Hgb appears to be better than on discharge on 3/1. At that time had GI bleed of unknown source, has not had any symptoms since then and he has been taking his anticoagulation. Was to undergo colonoscopy, however given his age and co-morbid conditions would consider further discussing risks/benefits with him and his family.  - Daily CBC  #History of bilateral pulmonary embolism Per patient and family, this was discovered at the end of an hospitalization a few years ago. Likely provoked and doesn't require further anticoagulation for this indication (but should continue d/t atrial fibrillation).    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   BEST PRACTICE   DIET: Regular IVF: na DVT PPX: will discuss when to re-start home Eliquis BOWEL: Senokot-S CODE: FULL FAM COM: Discussed with son at bedside on admission  DISPO: Admit patient to Observation with expected length of stay less than 2 midnights.  Evlyn Kanner, MD Internal Medicine Resident PGY-3 Pager [phone] 06/14/22 10:45 AM

## 2022-06-14 NOTE — Progress Notes (Signed)
Carotid artery duplex has been completed. Preliminary results can be found in CV Proc through chart review.   06/14/22 2:02 PM Olen Cordial RVT

## 2022-06-14 NOTE — Progress Notes (Signed)
Arrived to unit from Emergency Department. Placed on cardiac monitor and vitals obtained.

## 2022-06-14 NOTE — ED Provider Notes (Signed)
[place] EMERGENCY DEPARTMENT AT [facility] Provider Note   CSN: [id number] Arrival date & time: 06/13/22  1952     History  Chief Complaint  Patient presents with   Altered Mental Status    Kurt Hernandez is a 82 y.o. male.  Patient is an 82 year old male who presents today for altered mental status.  His son is at the bedside and states that his dad began to seem "not like himself" and slower than usual as of earlier today, although he seems to have returned to baseline now.  Describes his father "looking at you like he's looking through you"; would not speak or answer questions.  There was no acute injury or inciting event.  He is concerned that his dad has had another stroke.  His last stroke was in September of 2023 and they report no lasting deficits.  The patient denies any recent sick contacts, fever, chills, or trouble with urination. The patient has a strong appetite and eats regularly throughout the day.  He is hypertensive and bradycardic at the time of interview.  Stroke screen is negative and cranial nerves are intact.   Altered Mental Status     Home Medications Prior to Admission medications   Medication Sig Start Date End Date Taking? Authorizing Provider  BD VEO INSULIN SYRINGE U/F 31G X 15/64" 0.3 ML MISC USE TO INJECT INSULIN 6 TIMES DAILY 04/05/19   [provider]  CONTOUR NEXT TEST test strip TEST 3 TIMES A DAY AS DIRECTED 04/06/19   [provider]  ELIQUIS 5 MG TABS tablet Take 5 mg by mouth 2 (two) times daily. 04/29/19   [provider]  LEVEMIR 100 UNIT/ML injection Inject 25 Units into the skin every morning. 04/05/19   [provider]  levothyroxine (SYNTHROID) 75 MCG tablet Take 75 mcg by mouth daily. 12/18/21   [provider]  meclizine (ANTIVERT) 12.5 MG tablet Take 1 tablet (12.5 mg total) by mouth 3 (three) times daily as needed for dizziness. 09/14/21   Charlynne Pander, MD  meloxicam (MOBIC)  7.5 MG tablet Take 7.5 mg by mouth daily. 12/09/21   [provider]  mirtazapine (REMERON) 7.5 MG tablet Take 7.5 mg by mouth at bedtime.    [provider]  omeprazole (PRILOSEC) 40 MG capsule Take 40 mg by mouth 2 (two) times daily before a meal. 04/16/19   [provider]  ondansetron (ZOFRAN-ODT) 4 MG disintegrating tablet Take 1 tablet (4 mg total) by mouth every 8 (eight) hours as needed for nausea or vomiting. 03/11/22   Darlin Priestly, MD  venlafaxine XR (EFFEXOR-XR) 75 MG 24 hr capsule Take 75 mg by mouth daily. 03/04/22   [provider]      Allergies    Mirtazapine    Review of Systems   Review of Systems Ten systems reviewed and are negative for acute change, except as noted in the HPI.    Physical Exam Updated Vital Signs BP (!) 190/90   Pulse (!) 55   Temp (!) 97.5 F (36.4 C) (Oral)   Resp 19   Ht 6\' 2"  (1.88 m)   Wt 72.6 kg   SpO2 100%   BMI 20.54 kg/m   Physical Exam Vitals and nursing note reviewed.  Constitutional:      General: He is not in acute distress.    Appearance: He is well-developed. He is not diaphoretic.     Comments: Nontoxic appearing and in NAD  HENT:     Head: Normocephalic and atraumatic.  Eyes:     General: No scleral icterus.    Conjunctiva/sclera: Conjunctivae normal.  Pulmonary:     Effort: Pulmonary effort is normal. No respiratory distress.     Comments: Respirations even and unlabored Musculoskeletal:        General: Normal range of motion.     Cervical back: Normal range of motion.  Skin:    General: Skin is warm and dry.     Coloration: Skin is not pale.     Findings: No erythema or rash.  Neurological:     Mental Status: He is alert and oriented to person, place, and time.     Coordination: Coordination normal.     Comments: GCS 15. Speech is goal oriented. Asymmetric smile; son reports this to be baseline since a prior stroke. Otherwise no other deficits appreciated to CN III-XII; symmetric  eyebrow raise, tongue midline. Patient has equal grip strength bilaterally with 5/5 strength against resistance in all major muscle groups bilaterally. Sensation to light touch intact. Patient moves extremities without ataxia.   Psychiatric:        Behavior: Behavior normal.     ED Results / Procedures / Treatments   Labs (all labs ordered are listed, but only abnormal results are displayed) Labs Reviewed  COMPREHENSIVE METABOLIC PANEL - Abnormal; Notable for the following components:      Result Value   Sodium 133 (*)    Glucose, Bld 292 (*)    BUN 33 (*)    Creatinine, Ser 2.21 (*)    Total Protein 6.4 (*)    Albumin 3.3 (*)    AST 14 (*)    GFR, Estimated 29 (*)    All other components within normal limits  CBC - Abnormal; Notable for the following components:   RBC 3.83 (*)    Hemoglobin 11.6 (*)    HCT 34.0 (*)    All other components within normal limits  CBG MONITORING, ED - Abnormal; Notable for the following components:   Glucose-Capillary 302 (*)    All other components within normal limits  CBG MONITORING, ED - Abnormal; Notable for the following components:   Glucose-Capillary 256 (*)    All other components within normal limits  URINALYSIS, ROUTINE W REFLEX MICROSCOPIC  CBG MONITORING, ED    EKG None  Radiology CT Head Wo Contrast  Result Date: 06/13/2022 CLINICAL DATA:  Confusion. EXAM: CT HEAD WITHOUT CONTRAST TECHNIQUE: Contiguous axial images were obtained from the base of the skull through the vertex without intravenous contrast. RADIATION DOSE REDUCTION: This exam was performed according to the departmental dose-optimization program which includes automated exposure control, adjustment of the mA and/or kV according to patient size and/or use of iterative reconstruction technique. COMPARISON:  CT and MR examination dated September 14, 2021 FINDINGS: Brain: No evidence of acute infarction, hemorrhage, hydrocephalus, extra-axial collection or mass lesion/mass  effect. Right basal ganglia chronic infarct and centrum semiovale. Vascular: No hyperdense vessel or unexpected calcification. Skull: Normal. Negative for fracture or focal lesion. Sinuses/Orbits: No acute finding. Other: None. IMPRESSION: 1. No acute intracranial pathology. 2. Chronic right MCA territory infarct. Electronically Signed   By: Larose Hires D.O.   On: 06/13/2022 22:18    Procedures Procedures    Medications Ordered in ED Medications  apixaban (ELIQUIS) tablet 5 mg (has no administration in time range)  sodium chloride 0.9 % bolus 1,000 mL (0 mLs Intravenous Stopped 06/14/22 0617)  ED Course/ Medical Decision Making/ A&P Clinical Course as of 06/14/22 0625  Tue Jun 14, 2022  1610 Per chart review, patient appears to have a baseline blood pressure of around 1 70-1 80 systolic from his most recent visits.  I cannot see where he is currently prescribed blood pressure medications.  I did see where this was notated at his neurology visit at [facility] in December.  There was some mention about better hypertensive control with respect to reducing his recurrent CVA risk.  Patient has no focal neurologic deficits on exam today, though will proceed with MRI to assess for acute infarct.  He is chronically anticoagulated on Eliquis twice daily despite history of GI bleed. [KH]    Clinical Course User Index [KH] Antony Madura, PA-C                             Medical Decision Making Amount and/or Complexity of Data Reviewed Labs: ordered. Radiology: ordered.   This patient presents to the ED for concern of altered mental status, this involves an extensive number of treatment options, and is a complaint that carries with it a high risk of complications and morbidity.  The differential diagnosis includes CVA vs ICH vs UTI vs hypoglycemia vs hypoxemia   Co morbidities that complicate the patient evaluation  DM HTN CVA   Additional history obtained:  Additional history obtained from  son, at bedside External records from outside source obtained and reviewed including historical BP readings; outpatient Neurology visit in December 2023   Lab Tests:  I Ordered, and personally interpreted labs.  The pertinent results include:  Glucose 256, Hgb 11.6 (improved from 3 months ago, though may have degree of hemoconcentration), Creatinine 2.21 (up from 1.7)   Imaging Studies ordered:  I ordered imaging studies including CT head  I independently visualized and interpreted imaging which showed no acute intracranial abnormality I agree with the radiologist interpretation MRI ordered and is pending.   Cardiac Monitoring:  The patient was maintained on a cardiac monitor.  I personally viewed and interpreted the cardiac monitored which showed an underlying rhythm of: sinus bradycardia   Medicines ordered and prescription drug management:  I ordered medication including IVF for AKI, Eliquis for daily maintenance  Reevaluation of the patient after these medicines showed that the patient stayed the same I have reviewed the patients home medicines and have made adjustments as needed   Problem List / ED Course:  As above   Reevaluation:  After the interventions noted above, I reevaluated the patient and found that they have :stayed the same   Social Determinants of Health:  Ambulates with assist device (cane).   Dispostion:  Pending UA and MRI - care signed out to [place], PA-C at change of shift.         Final Clinical Impression(s) / ED Diagnoses Final diagnoses:  Altered mental status, unspecified altered mental status type    Rx / DC Orders ED Discharge Orders     None         Antony Madura, PA-C 06/14/22 [id number]    Shon Baton, MD 06/15/22 [phone]

## 2022-06-14 NOTE — ED Provider Notes (Signed)
Assumed care from [facility], PA-C at shift change pending MRI brain.  See her note for full HPI.  In short, patient is an 82 year old male with history of CVA who presents to the ED due to altered mental status.  Son at bedside notes that patient was not acting himself and appeared to be altered with slower speech and processing.  Per son at bedside patient currently at baseline.  Last CVA September 2023.  At shift change, MRI pending.  Physical Exam  BP (!) 190/90   Pulse (!) 55   Temp (!) 97.5 F (36.4 C) (Oral)   Resp 19   Ht 6\' 2"  (1.88 m)   Wt 72.6 kg   SpO2 100%   BMI 20.54 kg/m   Physical Exam Vitals and nursing note reviewed.  Constitutional:      General: He is not in acute distress.    Appearance: He is not ill-appearing.  HENT:     Head: Normocephalic.  Eyes:     Pupils: Pupils are equal, round, and reactive to light.  Cardiovascular:     Rate and Rhythm: Normal rate and regular rhythm.     Pulses: Normal pulses.     Heart sounds: Normal heart sounds. No murmur heard.    No friction rub. No gallop.  Pulmonary:     Effort: Pulmonary effort is normal.     Breath sounds: Normal breath sounds.  Abdominal:     General: Abdomen is flat. There is no distension.     Palpations: Abdomen is soft.     Tenderness: There is no abdominal tenderness. There is no guarding or rebound.  Musculoskeletal:        General: Normal range of motion.     Cervical back: Neck supple.  Skin:    General: Skin is warm and dry.  Neurological:     General: No focal deficit present.     Mental Status: He is alert.  Psychiatric:        Mood and Affect: Mood normal.        Behavior: Behavior normal.     Procedures  .Critical Care  Performed by: Mannie Stabile, PA-C Authorized by: Mannie Stabile, PA-C   Critical care provider statement:    Critical care time (minutes):  31   Critical care was necessary to treat or prevent imminent or life-threatening deterioration of the  following conditions:  CNS failure or compromise   Critical care was time spent personally by me on the following activities:  Development of treatment plan with patient or surrogate, discussions with consultants, evaluation of patient's response to treatment, examination of patient, ordering and review of laboratory studies, ordering and review of radiographic studies, ordering and performing treatments and interventions, pulse oximetry, re-evaluation of patient's condition and review of old charts   I assumed direction of critical care for this patient from another provider in my specialty: no     Care discussed with: admitting provider     ED Course / MDM   Clinical Course as of 06/14/22 0629  Tue Jun 14, 2022  1610 Per chart review, patient appears to have a baseline blood pressure of around 1 70-1 80 systolic from his most recent visits.  I cannot see where he is currently prescribed blood pressure medications.  I did see where this was notated at his neurology visit at [facility] in December.  There was some mention about better hypertensive control with respect to reducing his recurrent CVA risk.  Patient has no focal neurologic deficits on exam today, though will proceed with MRI to assess for acute infarct.  He is chronically anticoagulated on Eliquis twice daily despite history of GI bleed. [KH]    Clinical Course User Index [KH] Antony Madura, PA-C   Medical Decision Making Amount and/or Complexity of Data Reviewed Independent Historian: caregiver    Details: Son at bedside provided history Labs: ordered. Radiology: ordered and independent interpretation performed. Decision-making details documented in ED Course.  Risk Prescription drug management. Decision regarding hospitalization.   Assumed care from [facility], PA-C at shift change pending MRI brain.  See her note for full MDM.  82 year old male presents to the ED due to altered mental status.  History of CVA.  On Eliquis which  she has been compliant with.  Patient at baseline during my evaluation. No neurological deficits.   6:59 AM reassessed patient at bedside.  Patient at baseline per son at bedside.  Awaiting MRI.  Per son patient was acting altered starting around 12:30 PM yesterday.  Son had not seen patient since the previous day where he was at his baseline.  Lives with wife.  History of recurrent CVAs.  Patient hypertensive during ED stay.  Upon recheck during reassessment BP 172/88.  Patient states he is on blood pressure medication however, is unsure the name.  He has been compliant with his medication.  CBC with no leukocytosis.  Mild anemia hemoglobin 11.6.  CMP significant for hyperglycemia with 292.  No anion gap.  AKI with creatinine at 2.21.  Patient given IV fluids.  MRI personally reviewed and interpreted which is positive for acute CVA. Dr. Derry Lory with neurology made aware.  Will discuss with hospitalist for admission. Son and patient made aware of results.  8:59 AM Discussed with Dr. Marijo Conception with internal medicine who agrees to admit patient for further treatment.        Mannie Stabile, PA-C 06/14/22 1610    Jacalyn Lefevre, MD 06/14/22 [phone]

## 2022-06-14 NOTE — ED Notes (Signed)
Pt decided to leave while waiting for a room.  

## 2022-06-14 NOTE — Consult Note (Signed)
Neurology Consultation Reason for Consult: Confusion Referring Physician: Criselda Peaches, E  CC: Confusion  History is obtained from: Patient, chart  HPI: Kurt Hernandez is a 82 y.o. male with a history of A-fib on Eliquis, hypertension, diabetes who was brought to the emergency room with increasing confusion and found to have an acute ischemic stroke.  He denies noticing any left-sided weakness, and has an old left facial weakness.  Due to confusion, an MRI was performed which shows a subcortical stroke on the right involving the basal ganglia.   LKW: 6/2 tnk given?: no, outside of window  Past Medical History:  Diagnosis Date   Anemia    pernicious anemia   Arrhythmia    patient unaware of diagnosis except heart skips beats   Cancer (HCC)    lip cancer   Complication of anesthesia    unable to void after surgery and had to stay overnight for several days w/ cath   Diabetes mellitus without complication (HCC)    Headache    History of kidney stones    Hypertension    Kidney stone    Stroke [facility])      Family History  Problem Relation Age of Onset   Heart attack Father    Stroke Mother      Social History:  reports that he has never smoked. He has never used smokeless tobacco. He reports that he does not drink alcohol and does not use drugs.   Exam: Current vital signs: BP 137/82 (BP Location: Left Arm)   Pulse 72   Temp 98.7 F (37.1 C) (Oral)   Resp 16   Ht 6\' 2"  (1.88 m)   Wt 67 kg   SpO2 100%   BMI 18.96 kg/m  Vital signs in last 24 hours: Temp:  [97.5 F (36.4 C)-98.9 F (37.2 C)] 98.7 F (37.1 C) (06/04 1550) Pulse Rate:  [53-72] 72 (06/04 1550) Resp:  [13-28] 16 (06/04 1550) BP: (137-206)/(77-115) 137/82 (06/04 1550) SpO2:  [95 %-100 %] 100 % (06/04 1550) Weight:  [67 kg-72.6 kg] 67 kg (06/04 1700)   Physical Exam  Appears well-developed and well-nourished.   Neuro: Mental Status: Patient is awake, alert, oriented to person, place, month, year,  and situation. Patient is able to give a clear and coherent history. No signs of aphasia or neglect Cranial Nerves: II: Visual Fields are full. L pupil slightly larger than right, both are reactive.  III,IV, VI: EOMI without ptosis or diploplia.  V: Facial sensation is symmetric to temperature VII: Facial movement with left facial weakness.  VIII: hearing is intact to voice X: Uvula elevates symmetrically XI: Shoulder shrug is symmetric. XII: tongue is midline without atrophy or fasciculations.  Motor: Tone is normal. Bulk is normal. 5/5 strength was present on the right, but he had 4/5 weakness of the left arm and 4 -/5 weakness of the left leg Sensory: Sensation is symmetric to light touch and temperature in the arms and legs. Cerebellar: Does not perform     I have reviewed labs in epic and the results pertinent to this consultation are: Creatinine 2.21  I have reviewed the images obtained: MRI brain-right basal ganglia infarct  Impression: 82 year old male who presented with predominantly confusion, as well is left-sided weakness has a right basal ganglia infarct.  He does have a history of atrial fibrillation and is on anticoagulation.  I would favor holding the anticoagulation for now, using antiplatelet therapy with aspirin.  His stroke is relatively small, so could  likely restart fairly soon.  Recommendations: - HgbA1c, fasting lipid panel - Frequent neuro checks - Echocardiogram -Carotid Dopplers- Prophylactic therapy-aspirin 81 mg daily - Risk factor modification - Telemetry monitoring - PT consult, OT consult, Speech consult - Stroke team to follow   Ritta Slot, MD [facility] [phone]  If 7pm- 7am, please page neurology on call as listed in AMION.

## 2022-06-14 NOTE — ED Notes (Signed)
.ED TO INPATIENT HANDOFF REPORT  ED Nurse Name and Phone #: [phone]  S Name/Age/Gender Nilda Simmer Goulart 82 y.o. male Room/Bed: 037C/037C  Code Status   Code Status: Full Code  Home/SNF/Other Home Patient oriented to: self, place, time, and situation Is this baseline? 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 Today he has been confused and just not himself. Wife explains that pt has been off, and walking slow, gets disoriented when doing tasks. Pt also reports possible UTI. PT urine is dark with red tint. No fever or chills.    Allergies Allergies  Allergen Reactions   Mirtazapine Other (See Comments)    Other reaction(s): Dizziness-per wife, Patient has been tolerating 7.5 mg without dizziness.    Level of Care/Admitting Diagnosis ED Disposition     ED Disposition  Admit   Condition  --   Comment  Hospital Area: [facility] [[id number]]  Level of Care: Telemetry Medical [[id number]]  May place patient in observation at [facility] or [facility] if equivalent level of care is available:: No  Covid Evaluation: Asymptomatic - no recent exposure (last 10 days) testing not required  Diagnosis: Acute cerebrovascular accident (CVA) [facility]) [[id number]]  Admitting Physician: Inez Catalina [[id number]]  Attending Physician: Nena Polio          B Medical/Surgery History Past Medical History:  Diagnosis Date   Anemia    pernicious anemia   Arrhythmia    patient unaware of diagnosis except heart skips beats   Cancer (HCC)    lip cancer   Complication of anesthesia    unable to void after surgery and had to stay overnight for several days w/ cath   Diabetes mellitus without complication (HCC)    Headache    History of kidney stones    Hypertension    Kidney stone    Stroke [facility])    Past Surgical History:  Procedure Laterality Date   CYSTOSCOPY  W/ RETROGRADES Right 11/14/2017   Procedure: CYSTOSCOPY WITH RETROGRADE PYELOGRAM;  Surgeon: Riki Altes, MD;  Location: [facility] ORS;  Service: Urology;  Laterality: Right;   CYSTOSCOPY WITH STENT PLACEMENT Right 11/03/2017   Procedure: CYSTOSCOPY WITH STENT PLACEMENT;  Surgeon: Riki Altes, MD;  Location: [facility] ORS;  Service: Urology;  Laterality: Right;   CYSTOSCOPY/RETROGRADE/URETEROSCOPY Right 11/03/2017   Procedure: CYSTOSCOPY/RETROGRADE/URETEROSCOPY;  Surgeon: Riki Altes, MD;  Location: [facility] ORS;  Service: Urology;  Laterality: Right;   CYSTOSCOPY/URETEROSCOPY/HOLMIUM LASER/STENT PLACEMENT Right 11/14/2017   Procedure: CYSTOSCOPY/URETEROSCOPY/HOLMIUM LASER/STENT Exchange;  Surgeon: Riki Altes, MD;  Location: [facility] ORS;  Service: Urology;  Laterality: Right;  stent exchange   ESOPHAGOGASTRODUODENOSCOPY (EGD) WITH PROPOFOL N/A 03/10/2022   Procedure: ESOPHAGOGASTRODUODENOSCOPY (EGD) WITH PROPOFOL;  Surgeon: Wyline Mood, MD;  Location: [facility] ENDOSCOPY;  Service: Gastroenterology;  Laterality: N/A;   EYE SURGERY Right    cataract extraction.  needed yag laser and still has problems    HAND SURGERY Right 1989   tendons came undone from bones in wrist. metal removed   HERNIA REPAIR Bilateral 2010   inguinal hernias   HERNIA REPAIR       A IV Location/Drains/Wounds Patient Lines/Drains/Airways Status     Active Line/Drains/Airways     Name Placement date Placement time Site Days   Peripheral IV 06/14/22 18 G 1.16" Right Antecubital 06/14/22  0527  Antecubital  less than 1   Ureteral  Drain/Stent Right ureter 4.8 Fr. 11/14/17  0919  Right ureter  1673            Intake/Output Last 24 hours  Intake/Output Summary (Last 24 hours) at 06/14/2022 1515 Last data filed at 06/14/2022 0617 Gross per 24 hour  Intake 1000 ml  Output --  Net 1000 ml    Labs/Imaging Results for orders placed or performed during the hospital encounter of 06/13/22 (from the past 48 hour(s))  CBG  monitoring, ED     Status: Abnormal   Collection Time: 06/13/22  8:09 PM  Result Value Ref Range   Glucose-Capillary 302 (H) 70 - 99 mg/dL    Comment: Glucose reference range applies only to samples taken after fasting for at least 8 hours.  Comprehensive metabolic panel     Status: Abnormal   Collection Time: 06/13/22  8:31 PM  Result Value Ref Range   Sodium 133 (L) 135 - 145 mmol/L   Potassium 4.0 3.5 - 5.1 mmol/L   Chloride 103 98 - 111 mmol/L   CO2 24 22 - 32 mmol/L   Glucose, Bld 292 (H) 70 - 99 mg/dL    Comment: Glucose reference range applies only to samples taken after fasting for at least 8 hours.   BUN 33 (H) 8 - 23 mg/dL   Creatinine, Ser 9.14 (H) 0.61 - 1.24 mg/dL   Calcium 9.4 8.9 - 78.2 mg/dL   Total Protein 6.4 (L) 6.5 - 8.1 g/dL   Albumin 3.3 (L) 3.5 - 5.0 g/dL   AST 14 (L) 15 - 41 U/L   ALT 12 0 - 44 U/L   Alkaline Phosphatase 71 38 - 126 U/L   Total Bilirubin 0.8 0.3 - 1.2 mg/dL   GFR, Estimated 29 (L) >60 mL/min    Comment: (NOTE) Calculated using the CKD-EPI Creatinine Equation (2021)    Anion gap 6 5 - 15    Comment: Performed at [facility], [address] [place], [place] [address]  CBC     Status: Abnormal   Collection Time: 06/13/22  8:31 PM  Result Value Ref Range   WBC 5.8 4.0 - 10.5 K/uL   RBC 3.83 (L) 4.22 - 5.81 MIL/uL   Hemoglobin 11.6 (L) 13.0 - 17.0 g/dL   HCT 30.8 (L) 65.7 - 84.6 %   MCV 88.8 80.0 - 100.0 fL   MCH 30.3 26.0 - 34.0 pg   MCHC 34.1 30.0 - 36.0 g/dL   RDW 96.2 95.2 - 84.1 %   Platelets 181 150 - 400 K/uL   nRBC 0.0 0.0 - 0.2 %    Comment: Performed at [facility], [address], [place], [place] [address]  CBG monitoring, ED     Status: Abnormal   Collection Time: 06/14/22  5:43 AM  Result Value Ref Range   Glucose-Capillary 256 (H) 70 - 99 mg/dL    Comment: Glucose reference range applies only to samples taken after fasting for at least 8 hours.  Urinalysis, Routine w reflex microscopic -Urine, Clean  Catch     Status: Abnormal   Collection Time: 06/14/22  7:29 AM  Result Value Ref Range   Color, Urine YELLOW YELLOW   APPearance CLEAR CLEAR   Specific Gravity, Urine 1.012 1.005 - 1.030   pH 5.0 5.0 - 8.0   Glucose, UA >=500 (A) NEGATIVE mg/dL   Hgb urine dipstick NEGATIVE NEGATIVE   Bilirubin Urine NEGATIVE NEGATIVE   Ketones, ur NEGATIVE NEGATIVE mg/dL  Protein, ur NEGATIVE NEGATIVE mg/dL   Nitrite NEGATIVE NEGATIVE   Leukocytes,Ua NEGATIVE NEGATIVE   RBC / HPF 0-5 0 - 5 RBC/hpf   WBC, UA 0-5 0 - 5 WBC/hpf   Bacteria, UA NONE SEEN NONE SEEN   Squamous Epithelial / HPF 0-5 0 - 5 /HPF    Comment: Performed at [facility], [address], [place], [place] [address]  CBG monitoring, ED     Status: Abnormal   Collection Time: 06/14/22 12:38 PM  Result Value Ref Range   Glucose-Capillary 217 (H) 70 - 99 mg/dL    Comment: Glucose reference range applies only to samples taken after fasting for at least 8 hours.   VAS US CAROTID  Result Date: 06/14/2022 Carotid Arterial Duplex Study Patient Name:  SOO RALL  Date of Exam:   06/14/2022 Medical Rec #: [id number]          Accession #:    [id number] Date of Birth: 1940-04-28         Patient Gender: M Patient Age:   55 years Exam Location:  [facility] Procedure:      VAS US CAROTID Referring Phys: Terrilee Files [facility] --------------------------------------------------------------------------------  Indications:       CVA. Risk Factors:      Hypertension, hyperlipidemia, Diabetes. Comparison Study:  11/17/2021 - Right Carotid: The extracranial vessels were                    near-normal with only minimal wall thickening or plaque.                     Left Carotid: The extracranial vessels were near-normal with                    only minimal wall thickening or plaque.                     Vertebrals: Bilateral vertebral arteries demonstrate                    antegrade flow. Performing Technologist: Chanda Busing RVT  Examination  Guidelines: A complete evaluation includes B-mode imaging, spectral Doppler, color Doppler, and power Doppler as needed of all accessible portions of each vessel. Bilateral testing is considered an integral part of a complete examination. Limited examinations for reoccurring indications may be performed as noted.  Right Carotid Findings: +----------+--------+--------+--------+-----------------------+--------+           PSV cm/sEDV cm/sStenosisPlaque Description     Comments +----------+--------+--------+--------+-----------------------+--------+ CCA Prox  135     16              smooth and heterogenoustortuous +----------+--------+--------+--------+-----------------------+--------+ CCA Distal70      11              smooth and heterogenous         +----------+--------+--------+--------+-----------------------+--------+ ICA Prox  47      10              smooth and heterogenous         +----------+--------+--------+--------+-----------------------+--------+ ICA Mid   64      18              smooth and heterogenous         +----------+--------+--------+--------+-----------------------+--------+ ICA Distal52      11  tortuous +----------+--------+--------+--------+-----------------------+--------+ ECA       59      10                                              +----------+--------+--------+--------+-----------------------+--------+ +----------+--------+-------+--------+-------------------+           PSV cm/sEDV cmsDescribeArm Pressure (mmHG) +----------+--------+-------+--------+-------------------+ [id number]                                        +----------+--------+-------+--------+-------------------+ +---------+--------+--+--------+--+ VertebralPSV cm/s57EDV cm/s12 +---------+--------+--+--------+--+  Left Carotid Findings: +----------+--------+--------+--------+-----------------------+--------+            PSV cm/sEDV cm/sStenosisPlaque Description     Comments +----------+--------+--------+--------+-----------------------+--------+ CCA Prox  70      14              smooth and heterogenoustortuous +----------+--------+--------+--------+-----------------------+--------+ CCA Distal52      11              smooth and heterogenous         +----------+--------+--------+--------+-----------------------+--------+ ICA Prox  49      12              smooth and heterogenous         +----------+--------+--------+--------+-----------------------+--------+ ICA Mid   65      22              smooth and heterogenoustortuous +----------+--------+--------+--------+-----------------------+--------+ ICA Distal60      16                                     tortuous +----------+--------+--------+--------+-----------------------+--------+ ECA       52      9                                               +----------+--------+--------+--------+-----------------------+--------+ +----------+--------+--------+--------+-------------------+           PSV cm/sEDV cm/sDescribeArm Pressure (mmHG) +----------+--------+--------+--------+-------------------+ [id number]                                         +----------+--------+--------+--------+-------------------+ +---------+--------+--+--------+-+---------+ VertebralPSV cm/s39EDV cm/s8Antegrade +---------+--------+--+--------+-+---------+   Summary: Right Carotid: Velocities in the right ICA are consistent with a 1-39% stenosis. Left Carotid: Velocities in the left ICA are consistent with a 1-39% stenosis. Vertebrals: Bilateral vertebral arteries demonstrate antegrade flow. *See table(s) above for measurements and observations.     Preliminary    ECHOCARDIOGRAM COMPLETE  Result Date: 06/14/2022    ECHOCARDIOGRAM REPORT   Patient Name:   JAIMEN BECHTLE Date of Exam: 06/14/2022 Medical Rec #:  [id number]         Height:       74.0  in Accession #:    [id number]        Weight:       160.0 lb Date of Birth:  08-22-1940        BSA:          1.977 m Patient Age:    54 years  BP:           198/88 mmHg Patient Gender: M                 HR:           73 bpm. Exam Location:  Inpatient Procedure: 2D Echo, Cardiac Doppler and Color Doppler Indications:    cva  History:        Patient has no prior history of Echocardiogram examinations.                 Stroke; Risk Factors:Hypertension, Diabetes and Dyslipidemia.  Sonographer:    Melissa Morford RDCS (AE, PE) Referring Phys: [phone] JULIE HAVILAND IMPRESSIONS  1. Left ventricular ejection fraction, by estimation, is 50%. The left ventricle has low normal function. The left ventricle has no regional wall motion abnormalities. There is mild concentric left ventricular hypertrophy. Left ventricular diastolic parameters are consistent with Grade I diastolic dysfunction (impaired relaxation).  2. Right ventricular systolic function is normal. The right ventricular size is normal.  3. The mitral valve is normal in structure. No evidence of mitral valve regurgitation. No evidence of mitral stenosis.  4. The aortic valve is normal in structure. Aortic valve regurgitation is not visualized. No aortic stenosis is present.  5. The inferior vena cava is normal in size with greater than 50% respiratory variability, suggesting right atrial pressure of 3 mmHg. FINDINGS  Left Ventricle: Left ventricular ejection fraction, by estimation, is 50 to 55%. The left ventricle has low normal function. The left ventricle has no regional wall motion abnormalities. The left ventricular internal cavity size was normal in size. There is mild concentric left ventricular hypertrophy. Left ventricular diastolic parameters are consistent with Grade I diastolic dysfunction (impaired relaxation). Right Ventricle: The right ventricular size is normal. No increase in right ventricular wall thickness. Right ventricular systolic  function is normal. Left Atrium: Left atrial size was normal in size. Right Atrium: Right atrial size was normal in size. Pericardium: There is no evidence of pericardial effusion. Mitral Valve: The mitral valve is normal in structure. No evidence of mitral valve regurgitation. No evidence of mitral valve stenosis. Tricuspid Valve: The tricuspid valve is normal in structure. Tricuspid valve regurgitation is not demonstrated. No evidence of tricuspid stenosis. Aortic Valve: The aortic valve is normal in structure. Aortic valve regurgitation is not visualized. No aortic stenosis is present. Pulmonic Valve: The pulmonic valve was not well visualized. Pulmonic valve regurgitation is not visualized. No evidence of pulmonic stenosis. Aorta: The aortic root is normal in size and structure. Venous: The inferior vena cava is normal in size with greater than 50% respiratory variability, suggesting right atrial pressure of 3 mmHg. IAS/Shunts: No atrial level shunt detected by color flow Doppler.  LEFT VENTRICLE PLAX 2D LVIDd:         3.70 cm   Diastology LVIDs:         3.10 cm   LV e' medial:    6.09 cm/s LV PW:         1.10 cm   LV E/e' medial:  6.4 LV IVS:        1.10 cm   LV e' lateral:   9.14 cm/s LVOT diam:     2.20 cm   LV E/e' lateral: 4.2 LV SV:         61 LV SV Index:   31 LVOT Area:     3.80 cm  RIGHT VENTRICLE RV S prime:     15.90  cm/s TAPSE (M-mode): 3.0 cm LEFT ATRIUM           Index        RIGHT ATRIUM           Index LA diam:      3.50 cm 1.77 cm/m   RA Area:     20.10 cm LA Vol (A2C): 61.4 ml 31.06 ml/m  RA Volume:   60.60 ml  30.66 ml/m LA Vol (A4C): 58.1 ml 29.39 ml/m  AORTIC VALVE LVOT Vmax:   72.40 cm/s LVOT Vmean:  53.200 cm/s LVOT VTI:    0.161 m  AORTA Ao Root diam: 3.90 cm Ao Asc diam:  3.90 cm MITRAL VALVE MV Area (PHT): 2.15 cm    SHUNTS MV Decel Time: 353 msec    Systemic VTI:  0.16 m MV E velocity: 38.80 cm/s  Systemic Diam: 2.20 cm MV A velocity: 71.30 cm/s MV E/A ratio:  0.54 Aditya  Sabharwal Electronically signed by Dorthula Nettles Signature Date/Time: 06/14/2022/12:25:46 PM    Final    MR BRAIN WO CONTRAST  Result Date: 06/14/2022 CLINICAL DATA:  Neuro deficit, acute, stroke suspected. EXAM: MRI HEAD WITHOUT CONTRAST MRA HEAD WITHOUT CONTRAST TECHNIQUE: Multiplanar, multi-echo pulse sequences of the brain and surrounding structures were acquired without intravenous contrast. Angiographic images of the Circle of Willis were acquired using MRA technique without intravenous contrast. COMPARISON:  Head CT 06/13/2022. FINDINGS: MRI HEAD FINDINGS Brain: Acute infarct in the right basal ganglia. No acute hemorrhage or significant mass effect. Underlying moderate chronic small-vessel disease with scattered old lacunar infarcts in the bilateral basal ganglia, thalami, pons and cerebral white matter. Sequela of prior hemorrhage in the right caudate. Few, scattered chronic microhemorrhages. No hydrocephalus or extra-axial collection. Vascular: Normal flow voids. Skull and upper cervical spine: Normal marrow signal. Joint effusions in the right-greater-than-left occipital condylar-C1 joints. Sinuses/Orbits: Unremarkable. Other: None. MRA HEAD FINDINGS Anterior circulation: Intracranial ICAs are patent without stenosis or aneurysm. Mild stenosis of the right MCA, M1 segment. The left MCA and bilateral ACAs are patent proximally without stenosis or aneurysm. Distal branches are symmetric. Posterior circulation: Visualized portions of the distal vertebral arteries and basilar artery are patent without stenosis or aneurysm. The SCAs, AICAs and PICAs are patent proximally. The PCAs are patent proximally without stenosis or aneurysm. Distal branches are symmetric. Anatomic variants: Hypoplastic A1 segment of the right ACA. IMPRESSION: 1. Acute infarct in the right basal ganglia. No acute hemorrhage or significant mass effect. 2. Mild stenosis of the right MCA, M1 segment. Electronically Signed   By: Orvan Falconer M.D.   On: 06/14/2022 08:32   MR ANGIO HEAD WO CONTRAST  Result Date: 06/14/2022 CLINICAL DATA:  Neuro deficit, acute, stroke suspected. EXAM: MRI HEAD WITHOUT CONTRAST MRA HEAD WITHOUT CONTRAST TECHNIQUE: Multiplanar, multi-echo pulse sequences of the brain and surrounding structures were acquired without intravenous contrast. Angiographic images of the Circle of Willis were acquired using MRA technique without intravenous contrast. COMPARISON:  Head CT 06/13/2022. FINDINGS: MRI HEAD FINDINGS Brain: Acute infarct in the right basal ganglia. No acute hemorrhage or significant mass effect. Underlying moderate chronic small-vessel disease with scattered old lacunar infarcts in the bilateral basal ganglia, thalami, pons and cerebral white matter. Sequela of prior hemorrhage in the right caudate. Few, scattered chronic microhemorrhages. No hydrocephalus or extra-axial collection. Vascular: Normal flow voids. Skull and upper cervical spine: Normal marrow signal. Joint effusions in the right-greater-than-left occipital condylar-C1 joints. Sinuses/Orbits: Unremarkable. Other: None. MRA HEAD FINDINGS Anterior circulation: Intracranial ICAs  are patent without stenosis or aneurysm. Mild stenosis of the right MCA, M1 segment. The left MCA and bilateral ACAs are patent proximally without stenosis or aneurysm. Distal branches are symmetric. Posterior circulation: Visualized portions of the distal vertebral arteries and basilar artery are patent without stenosis or aneurysm. The SCAs, AICAs and PICAs are patent proximally. The PCAs are patent proximally without stenosis or aneurysm. Distal branches are symmetric. Anatomic variants: Hypoplastic A1 segment of the right ACA. IMPRESSION: 1. Acute infarct in the right basal ganglia. No acute hemorrhage or significant mass effect. 2. Mild stenosis of the right MCA, M1 segment. Electronically Signed   By: Orvan Falconer M.D.   On: 06/14/2022 08:32   CT Head Wo  Contrast  Result Date: 06/13/2022 CLINICAL DATA:  Confusion. EXAM: CT HEAD WITHOUT CONTRAST TECHNIQUE: Contiguous axial images were obtained from the base of the skull through the vertex without intravenous contrast. RADIATION DOSE REDUCTION: This exam was performed according to the departmental dose-optimization program which includes automated exposure control, adjustment of the mA and/or kV according to patient size and/or use of iterative reconstruction technique. COMPARISON:  CT and MR examination dated September 14, 2021 FINDINGS: Brain: No evidence of acute infarction, hemorrhage, hydrocephalus, extra-axial collection or mass lesion/mass effect. Right basal ganglia chronic infarct and centrum semiovale. Vascular: No hyperdense vessel or unexpected calcification. Skull: Normal. Negative for fracture or focal lesion. Sinuses/Orbits: No acute finding. Other: None. IMPRESSION: 1. No acute intracranial pathology. 2. Chronic right MCA territory infarct. Electronically Signed   By: Larose Hires D.O.   On: 06/13/2022 22:18    Pending Labs Unresulted Labs (From admission, onward)     Start     Ordered   06/15/22 0500  Lipid panel  (Labs)  Tomorrow morning,   R       Comments: Fasting    06/14/22 1007   06/15/22 0500  Hemoglobin A1c  (Labs)  Tomorrow morning,   R       Comments: To assess prior glycemic control    06/14/22 1007   06/15/22 0500  Renal function panel  Tomorrow morning,   R        06/14/22 1237   06/15/22 0500  Magnesium  Tomorrow morning,   R        06/14/22 1237   06/15/22 0500  CBC  Tomorrow morning,   R        06/14/22 1237            Vitals/Pain Today's Vitals   06/14/22 1100 06/14/22 1130 06/14/22 1445 06/14/22 1446  BP: (!) 195/96 (!) 198/88 (!) 176/77   Pulse: (!) 57 (!) 53 (!) 53   Resp: (!) 28 16 16    Temp:    98.9 F (37.2 C)  TempSrc:    Oral  SpO2: 100% 100% 98%   Weight:      Height:      PainSc:        Isolation Precautions No active  isolations  Medications Medications   stroke: early stages of recovery book (has no administration in time range)  acetaminophen (TYLENOL) tablet 650 mg (has no administration in time range)    Or  acetaminophen (TYLENOL) 160 MG/5ML solution 650 mg (has no administration in time range)    Or  acetaminophen (TYLENOL) suppository 650 mg (has no administration in time range)  senna-docusate (Senokot-S) tablet 1 tablet (has no administration in time range)  pantoprazole (PROTONIX) EC tablet 40 mg (40 mg Oral Given 06/14/22 1311)  levothyroxine (SYNTHROID) tablet 75 mcg (75 mcg Oral Given 06/14/22 1310)  insulin glargine-yfgn (SEMGLEE) injection 20 Units (has no administration in time range)  insulin aspart (novoLOG) injection 0-15 Units (4 Units Subcutaneous Given 06/14/22 1313)  rosuvastatin (CRESTOR) tablet 10 mg (10 mg Oral Patient Refused/Not Given 06/14/22 1311)  feeding supplement (ENSURE ENLIVE / ENSURE PLUS) liquid 237 mL (has no administration in time range)  sodium chloride 0.9 % bolus 1,000 mL (0 mLs Intravenous Stopped 06/14/22 0617)    Mobility walks     Focused Assessments Cardiac Assessment Handoff:  Cardiac Rhythm: Sinus bradycardia No results found for: "CKTOTAL", "CKMB", "CKMBINDEX", "TROPONINI" No results found for: "DDIMER" Does the Patient currently have chest pain? No   , Neuro Assessment Handoff:  Swallow screen pass? Yes  Cardiac Rhythm: Sinus bradycardia NIH Stroke Scale  Dizziness Present: No Headache Present: No Interval: Initial Level of Consciousness (1a.)   : Alert, keenly responsive LOC Questions (1b. )   : Answers both questions correctly LOC Commands (1c. )   : Performs both tasks correctly Best Gaze (2. )  : Normal Visual (3. )  : No visual loss Facial Palsy (4. )    : Normal symmetrical movements Motor Arm, Left (5a. )   : No drift Motor Arm, Right (5b. ) : No drift Motor Leg, Left (6a. )  : No drift Motor Leg, Right (6b. ) : No drift Limb Ataxia  (7. ): Absent Sensory (8. )  : Normal, no sensory loss Best Language (9. )  : No aphasia Dysarthria (10. ): Normal Extinction/Inattention (11.)   : No Abnormality Complete NIHSS TOTAL: 0 Last date known well: 06/12/22 Last time known well: 2200 Neuro Assessment: Within Defined Limits Neuro Checks:   Initial (06/14/22 1000)  Has TPA been given? No If patient is a Neuro Trauma and patient is going to OR before floor call report to 4N Charge nurse: [phone] or [phone]   R Recommendations: See Admitting Provider Note  Report given to:   Additional Notes: Aox4, walkie talkie, NIH 0

## 2022-06-15 DIAGNOSIS — E119 Type 2 diabetes mellitus without complications: Secondary | ICD-10-CM | POA: Diagnosis not present

## 2022-06-15 DIAGNOSIS — N179 Acute kidney failure, unspecified: Secondary | ICD-10-CM

## 2022-06-15 DIAGNOSIS — D638 Anemia in other chronic diseases classified elsewhere: Secondary | ICD-10-CM

## 2022-06-15 DIAGNOSIS — I10 Essential (primary) hypertension: Secondary | ICD-10-CM | POA: Diagnosis not present

## 2022-06-15 DIAGNOSIS — E039 Hypothyroidism, unspecified: Secondary | ICD-10-CM

## 2022-06-15 DIAGNOSIS — Z794 Long term (current) use of insulin: Secondary | ICD-10-CM

## 2022-06-15 DIAGNOSIS — Z86711 Personal history of pulmonary embolism: Secondary | ICD-10-CM

## 2022-06-15 DIAGNOSIS — G238 Other specified degenerative diseases of basal ganglia: Secondary | ICD-10-CM

## 2022-06-15 DIAGNOSIS — N1831 Chronic kidney disease, stage 3a: Secondary | ICD-10-CM

## 2022-06-15 DIAGNOSIS — I639 Cerebral infarction, unspecified: Secondary | ICD-10-CM | POA: Diagnosis not present

## 2022-06-15 DIAGNOSIS — E46 Unspecified protein-calorie malnutrition: Secondary | ICD-10-CM

## 2022-06-15 DIAGNOSIS — K219 Gastro-esophageal reflux disease without esophagitis: Secondary | ICD-10-CM

## 2022-06-15 DIAGNOSIS — I480 Paroxysmal atrial fibrillation: Secondary | ICD-10-CM

## 2022-06-15 LAB — LIPID PANEL
Cholesterol: 183 mg/dL (ref 0–200)
HDL: 38 mg/dL — ABNORMAL LOW (ref >40)
LDL Cholesterol: 127 mg/dL — ABNORMAL HIGH (ref 0–99)
Total CHOL/HDL Ratio: 4.8 RATIO
Triglycerides: 91 mg/dL (ref <150)
VLDL: 18 mg/dL (ref 0–40)

## 2022-06-15 LAB — RENAL FUNCTION PANEL
Albumin: 3.1 g/dL — ABNORMAL LOW (ref 3.5–5.0)
Anion gap: 8 (ref 5–15)
BUN: 24 mg/dL — ABNORMAL HIGH (ref 8–23)
CO2: 24 mmol/L (ref 22–32)
Calcium: 9.5 mg/dL (ref 8.9–10.3)
Chloride: 103 mmol/L (ref 98–111)
Creatinine, Ser: 1.78 mg/dL — ABNORMAL HIGH (ref 0.61–1.24)
GFR, Estimated: 38 mL/min — ABNORMAL LOW (ref >60)
Glucose, Bld: 163 mg/dL — ABNORMAL HIGH (ref 70–99)
Phosphorus: 3.6 mg/dL (ref 2.5–4.6)
Potassium: 3.4 mmol/L — ABNORMAL LOW (ref 3.5–5.1)
Sodium: 135 mmol/L (ref 135–145)

## 2022-06-15 LAB — CBC
HCT: 34.9 % — ABNORMAL LOW (ref 39.0–52.0)
Hemoglobin: 12.4 g/dL — ABNORMAL LOW (ref 13.0–17.0)
MCH: 31.5 pg (ref 26.0–34.0)
MCHC: 35.5 g/dL (ref 30.0–36.0)
MCV: 88.6 fL (ref 80.0–100.0)
Platelets: 171 10*3/uL (ref 150–400)
RBC: 3.94 MIL/uL — ABNORMAL LOW (ref 4.22–5.81)
RDW: 12.2 % (ref 11.5–15.5)
WBC: 7.2 10*3/uL (ref 4.0–10.5)
nRBC: 0 % (ref 0.0–0.2)

## 2022-06-15 LAB — MAGNESIUM: Magnesium: 1.9 mg/dL (ref 1.7–2.4)

## 2022-06-15 LAB — GLUCOSE, CAPILLARY
Glucose-Capillary: 65 mg/dL — ABNORMAL LOW (ref 70–99)
Glucose-Capillary: 138 mg/dL — ABNORMAL HIGH (ref 70–99)
Glucose-Capillary: 56 mg/dL — ABNORMAL LOW (ref 70–99)
Glucose-Capillary: 194 mg/dL — ABNORMAL HIGH (ref 70–99)
Glucose-Capillary: 134 mg/dL — ABNORMAL HIGH (ref 70–99)
Glucose-Capillary: 157 mg/dL — ABNORMAL HIGH (ref 70–99)

## 2022-06-15 LAB — HEMOGLOBIN A1C
Hgb A1c MFr Bld: 8.8 % — ABNORMAL HIGH (ref 4.8–5.6)
Mean Plasma Glucose: 206 mg/dL

## 2022-06-15 MED ORDER — INSULIN ASPART 100 UNIT/ML IJ SOLN
0.0000 [IU] | Freq: Three times a day (TID) | INTRAMUSCULAR | Status: DC
  Administered 2022-06-15: 2 [IU] via SUBCUTANEOUS
  Administered 2022-06-15: 1 [IU] via SUBCUTANEOUS
  Administered 2022-06-16: 5 [IU] via SUBCUTANEOUS

## 2022-06-15 MED ORDER — ASPIRIN 81 MG PO TBEC: 81.0000 mg | DELAYED_RELEASE_TABLET | Freq: Every day | ORAL | Status: DC

## 2022-06-15 MED ORDER — INSULIN GLARGINE-YFGN 100 UNIT/ML ~~LOC~~ SOLN
10.0000 [IU] | Freq: Every day | SUBCUTANEOUS | Status: DC
  Administered 2022-06-15: 10 [IU] via SUBCUTANEOUS
  Filled 2022-06-15 (×2): qty 0.1

## 2022-06-15 MED ORDER — DEXTROSE 50 % IV SOLN
INTRAVENOUS | Status: AC
  Administered 2022-06-15: 25 mL
  Filled 2022-06-15: qty 50

## 2022-06-15 MED ORDER — APIXABAN 5 MG PO TABS
5.0000 mg | ORAL_TABLET | Freq: Two times a day (BID) | ORAL | Status: DC
  Administered 2022-06-15 – 2022-06-16 (×2): 5 mg via ORAL
  Filled 2022-06-15 (×2): qty 1

## 2022-06-15 MED ORDER — APIXABAN 2.5 MG PO TABS
2.5000 mg | ORAL_TABLET | Freq: Two times a day (BID) | ORAL | Status: DC
  Administered 2022-06-15: 2.5 mg via ORAL
  Filled 2022-06-15: qty 1

## 2022-06-15 NOTE — TOC Initial Note (Signed)
Transition of Care [facility]) - Initial/Assessment Note    Patient Details  Name: Kurt Hernandez MRN: [id number] Date of Birth: 11-Nov-1940  Transition of Care [facility]) CM/SW Contact:    Kermit Balo, RN Phone Number: 06/15/2022, 1:31 PM  Clinical Narrative:                 Pt is from home with his spouse. He has been IADL except wife oversees his medications.  Pt drives self as needed. Wife can also drive.  Recommendations are for outpatient therapy. Pt lives in [place] and they prefer to attend at [facility]. CM will send the referral and the outpatient will contact the patient for the first appointment.  Wife will transport home when medically ready. TOC following.  Expected Discharge Plan: OP Rehab Barriers to Discharge: Continued Medical Work up   Patient Goals and CMS Choice   CMS Medicare.gov Compare Post Acute Care list provided to:: Patient Represenative (must comment) Choice offered to / list presented to : Spouse      Expected Discharge Plan and Services   Discharge Planning Services: CM Consult   Living arrangements for the past 2 months: Single Family Home                                      Prior Living Arrangements/Services Living arrangements for the past 2 months: Single Family Home Lives with:: Spouse Patient language and need for interpreter reviewed:: Yes Do you feel safe going back to the place where you live?: Yes      Need for Family Participation in Patient Care: Yes (Comment) Care giver support system in place?: Yes (comment) Current home services: DME (cane/ walker/ shower seat/ wheelchair/ [employer]) Criminal Activity/Legal Involvement Pertinent to Current Situation/Hospitalization: No - Comment as needed  Activities of Daily Living Home Assistive Devices/Equipment: Cane (specify quad or straight) ADL Screening (condition at time of admission) Patient's cognitive ability adequate to safely complete daily activities?: Yes Is the patient deaf or have  difficulty hearing?: No Does the patient have difficulty seeing, even when wearing glasses/contacts?: No Does the patient have difficulty concentrating, remembering, or making decisions?: No Patient able to express need for assistance with ADLs?: Yes Does the patient have difficulty dressing or bathing?: No Independently performs ADLs?: Yes (appropriate for developmental age) Does the patient have difficulty walking or climbing stairs?: Yes Weakness of Legs: Left Weakness of Arms/Hands: None  Permission Sought/Granted                  Emotional Assessment Appearance:: Appears stated age         Psych Involvement: No (comment)  Admission diagnosis:  Altered mental status, unspecified altered mental status type [R41.82] Acute cerebrovascular accident (CVA) [facility]) [I63.9] Patient Active Problem List   Diagnosis Date Noted   Acute cerebrovascular accident (CVA) (HCC) 06/14/2022   Protein-calorie malnutrition, severe 03/09/2022   UGIB (upper gastrointestinal bleed) 03/07/2022   AKI (acute kidney injury) (HCC) 03/07/2022   Acute blood loss anemia 03/07/2022   GI bleed 03/07/2022   History of CVA (cerebrovascular accident) 10/03/2019   History of 2019 novel coronavirus disease (COVID-19) 09/26/2019   Acute respiratory failure due to COVID-19 (HCC) 09/20/2019   Type 2 diabetes mellitus with stage 3b chronic kidney disease, without long-term current use of insulin (HCC) 04/26/2019   History of bilateral pulmonary embolism 2021 04/16/2019   History of atrial fibrillation 04/16/2019  Stage 3a chronic kidney disease (CKD) (HCC) 01/01/2019   Hyperlipidemia, mixed 06/27/2018   Abdominal wall hernia 12/29/2017   Nephrolithiasis 12/29/2017   Preoperative cardiovascular examination 10/24/2017   Peroneal tendinitis 09/08/2017   Osteoarthritis of wrist 09/08/2017   Closed fracture of heel bone 09/08/2017   Osteoarthritis of carpometacarpal (CMC) joint of thumb 09/08/2017   Acquired  hypothyroidism 11/21/2016   PVC's (premature ventricular contractions) 08/24/2016   Benign essential hypertension 11/19/2015   Cervical disc disorder with radiculopathy 11/19/2015   Lumbar radiculopathy 06/17/2013   Peripheral neuropathy 05/13/2013   Pernicious anemia 05/13/2013   PCP:  Danella Penton, MD Pharmacy:   [facility] [address] [place] - [address] [place] [address] Phone: [phone] Fax: [phone]     Social Determinants of Health (SDOH) Social History: SDOH Screenings   Food Insecurity: No Food Insecurity (06/14/2022)  Housing: Low Risk  (06/14/2022)  Transportation Needs: No Transportation Needs (06/14/2022)  Utilities: Not At Risk (06/14/2022)  Tobacco Use: Low Risk  (06/13/2022)   SDOH Interventions:     Readmission Risk Interventions     No data to display

## 2022-06-15 NOTE — Evaluation (Signed)
Occupational Therapy Evaluation Patient Details Name: Kurt Hernandez MRN: [id number] DOB: 1940-11-12 Today's Date: 06/15/2022   History of Present Illness Kurt Hernandez is a 82 y.o. male who presents for AMS. MRI revealed acute infarct in the right basal ganglia. PMHx: CVA, A-fib on Eliquis, hypertension, diabetes, pulmonary embolism, chronic kidney disease stage IIIb, hypothyroidism, GERD.   Clinical Impression   Pt evaluated s/p above admission list. Pt reports modified independence with ADLs and functional mobility with use of SPC. Pt present with mild LUE incoordination with finger to nose, reciprocal finger opposition, dysdiadochokinesia, limited insight into deficits, difficulty with dual tasking, and decreased attention. Pt reports baseline visual deficits from previous strokes noting difficulty with depth perception. Vision to be further assessed. Pt is currently setup A for seated UB ADLs and min A for LB ADLs. Pt would benefit from continued acute OT services to maximize functional independence and LUE coordination. Pt would benefit from outpatient neuro OT services upon discharge.     Recommendations for follow up therapy are one component of a multi-disciplinary discharge planning process, led by the attending physician.  Recommendations may be updated based on patient status, additional functional criteria and insurance authorization.   Assistance Recommended at Discharge PRN  Patient can return home with the following Assistance with cooking/housework;Direct supervision/assist for medications management;Direct supervision/assist for financial management;Assist for transportation    Functional Status Assessment  Patient has had a recent decline in their functional status and demonstrates the ability to make significant improvements in function in a reasonable and predictable amount of time.  Equipment Recommendations  None recommended by OT    Recommendations for Other  Services       Precautions / Restrictions Precautions Precautions: Fall Restrictions Weight Bearing Restrictions: No      Mobility Bed Mobility Overal bed mobility: Needs Assistance Bed Mobility: Supine to Sit     Supine to sit: Supervision, HOB elevated     General bed mobility comments: HOB elevated, use of bed rail    Transfers Overall transfer level: Needs assistance Equipment used: Straight cane Transfers: Sit to/from Stand Sit to Stand: Min guard           General transfer comment: STS from EOB with min guard A, room level mobility with min guard A and use of SPC      Balance Overall balance assessment: Needs assistance Sitting-balance support: No upper extremity supported, Feet supported Sitting balance-Leahy Scale: Good Sitting balance - Comments: sitting EOB   Standing balance support: Single extremity supported, Reliant on assistive device for balance Standing balance-Leahy Scale: Fair Standing balance comment: RUE support on [facility] for stability                           ADL either performed or assessed with clinical judgement   ADL Overall ADL's : Needs assistance/impaired Eating/Feeding: Bed level;Modified independent   Grooming: Wash/dry hands;Wash/dry face;Min guard;Standing Grooming Details (indicate cue type and reason): able to offload BUEs during grooming tasks Upper Body Bathing: Set up;Sitting   Lower Body Bathing: Sit to/from stand;Minimal assistance   Upper Body Dressing : Set up;Sitting   Lower Body Dressing: Minimal assistance;Sit to/from stand Lower Body Dressing Details (indicate cue type and reason): Pt doffed/donned R sock while seated EOB via figure four position with supervision. Pt doffed/donned L sock at EOB with min A for pulling sock past heel, pt with difficulty acheiving figure four position with LLE. Toilet Transfer: Solicitor (  [facility]) Toilet Transfer Details (indicate cue type and reason):  simulated Toileting- Clothing Manipulation and Hygiene: Minimal assistance;Sit to/from stand       Functional mobility during ADLs: Min guard;Cane General ADL Comments: required increased time to complete tasks, difficulty with dual tasking     Vision Baseline Vision/History: 1 Wears glasses (wears glasses for reading) Ability to See in Adequate Light: 0 Adequate Patient Visual Report: No change from baseline (Pt reports baseline difficulties with depth perception, admits he probably should not be driving.) Vision Assessment?: Vision impaired- to be further tested in functional context     Perception Perception Perception Tested?: No   Praxis Praxis Praxis tested?: Not tested    Pertinent Vitals/Pain Pain Assessment Pain Assessment: No/denies pain     Hand Dominance Right   Extremity/Trunk Assessment Upper Extremity Assessment Upper Extremity Assessment: Overall WFL for tasks assessed;LUE deficits/detail LUE Deficits / Details: mild incoordination noted with reciprocal finger opposition, finger to nose, and dysdiadochokinesia   Lower Extremity Assessment Lower Extremity Assessment: Defer to PT evaluation   Cervical / Trunk Assessment Cervical / Trunk Assessment: Kyphotic   Communication Communication Communication: No difficulties   Cognition Arousal/Alertness: Awake/alert Behavior During Therapy: Flat affect Overall Cognitive Status: No family/caregiver present to determine baseline cognitive functioning                                 General Comments: Pt A+O x4, however pt required increased time to process/answer questions, limited insight into deficits and difficulty with dual-tasking.     General Comments  VSS on RA    Exercises     Shoulder Instructions      Home Living Family/patient expects to be discharged to:: Private residence Living Arrangements: Spouse/significant other Available Help at Discharge: Family Type of Home: House        Home Layout: Two level;Able to live on main level with bedroom/bathroom     Bathroom Shower/Tub: Producer, television/film/video: Standard     Home Equipment: Cane - single Librarian, academic (2 wheels);Wheelchair - manual   Additional Comments: Lives with wife who is retired and available 24/7  Lives With: Spouse    Prior Functioning/Environment Prior Level of Function : Independent/Modified Independent;Working/employed;Driving             Mobility Comments: Pt ambulates with mod I and use of a SPC at baseline ADLs Comments: Pt is independent with ADLs, works on his cattle farm, still driving. Wife assists with medications.        OT Problem List: Decreased strength;Decreased range of motion;Decreased activity tolerance;Impaired balance (sitting and/or standing);Impaired vision/perception;Decreased cognition;Decreased coordination;Decreased safety awareness;Decreased knowledge of precautions      OT Treatment/Interventions: Self-care/ADL training;Neuromuscular education;Energy conservation;DME and/or AE instruction;Therapeutic activities;Cognitive remediation/compensation;Visual/perceptual remediation/compensation;Patient/family education;Balance training    OT Goals(Current goals can be found in the care plan section) Acute Rehab OT Goals Patient Stated Goal: to eat breakfast OT Goal Formulation: With patient Time For Goal Achievement: 06/29/22 Potential to Achieve Goals: Good ADL Goals Pt Will Perform Lower Body Dressing: with modified independence;sit to/from stand Pt Will Transfer to Toilet: with modified independence;ambulating;regular height toilet Additional ADL Goal #1: Pt will independently complete LUE fine motor HEP Additional ADL Goal #2: Pt will attend to functional ADL task for 5 minutes to demonstrate improved cognition  OT Frequency: Min 2X/week    Co-evaluation              AM-PAC  OT "6 Clicks" Daily Activity     Outcome Measure Help  from another person eating meals?: None Help from another person taking care of personal grooming?: A Little Help from another person toileting, which includes using toliet, bedpan, or urinal?: A Little Help from another person bathing (including washing, rinsing, drying)?: A Little Help from another person to put on and taking off regular upper body clothing?: A Little Help from another person to put on and taking off regular lower body clothing?: A Little 6 Click Score: 19   End of Session Equipment Utilized During Treatment: Gait belt;Other (comment) [facility]) Nurse Communication: Mobility status  Activity Tolerance: Patient tolerated treatment well Patient left: in chair;with call bell/phone within reach;with chair alarm set  OT Visit Diagnosis: Unsteadiness on feet (R26.81);Other abnormalities of gait and mobility (R26.89);Muscle weakness (generalized) (M62.81);Low vision, both eyes (H54.2)                Time: 0981-1914 OT Time Calculation (min): 30 min Charges:  OT General Charges $OT Visit: 1 Visit OT Evaluation $OT Eval Moderate Complexity: 1 Mod OT Treatments $Self Care/Home Management : 8-22 mins  Sherley Bounds, OTS Acute Rehabilitation Services Office [phone] Secure Chat Communication Preferred   Sherley Bounds 06/15/2022, 10:39 AM

## 2022-06-15 NOTE — Progress Notes (Addendum)
STROKE TEAM PROGRESS NOTE   INTERVAL HISTORY His wife is at the bedside.  He is sleeping after working with physical therapy. May consider changing to pradaxa or xarelto. Wife is concerned about switching medications until she is able to talk to one of [name] physicians. He has had recent medication changes through a clinic in [place] however we do not have access to all of the records. He is currently taking ivermectin and recently discontinued a number of medications when he resumed that.  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  Carotid ultrasound shows no significant extracranial stenosis.  Echocardiogram showed ejection  fraction of 50 to 55%. Vitals:   06/14/22 2005 06/14/22 2338 06/15/22 0421 06/15/22 0811  BP: (!) 155/76 (!) 142/75 136/67 (!) (P) 171/89  Pulse: 71 64 (!) 59 (P) 62  Resp: 18 17 18  (P) 18  Temp: 98.5 F (36.9 C) 98 F (36.7 C) 97.7 F (36.5 C) (P) 97.9 F (36.6 C)  TempSrc: Oral Oral  (P) Oral  SpO2: 100% 99% 98% (P) 100%  Weight:      Height:       CBC:  Recent Labs  Lab 06/13/22 2031 06/15/22 0706  WBC 5.8 7.2  HGB 11.6* 12.4*  HCT 34.0* 34.9*  MCV 88.8 88.6  PLT 181 171   Basic Metabolic Panel:  Recent Labs  Lab 06/13/22 2031 06/15/22 0706  NA 133*  --   K 4.0  --   CL 103  --   CO2 24  --   GLUCOSE 292*  --   BUN 33*  --   CREATININE 2.21*  --   CALCIUM 9.4  --   MG  --  1.9   Lipid Panel: No results for input(s): "CHOL", "TRIG", "HDL", "CHOLHDL", "VLDL", "LDLCALC" in the last 168 hours. HgbA1c: No results for input(s): "HGBA1C" in the last 168 hours. Urine Drug Screen: No results for input(s): "LABOPIA", "COCAINSCRNUR", "LABBENZ", "AMPHETMU", "THCU", "LABBARB" in the last 168 hours.  Alcohol Level No results for input(s): "ETH" in the last 168 hours.  IMAGING past 24 hours VAS US CAROTID  Result Date: 06/14/2022 Carotid Arterial Duplex Study Patient Name:  Kurt Hernandez  Date of Exam:   06/14/2022 Medical Rec  #: [id number]          Accession #:    [id number] Date of Birth: 08-15-1940         Patient Gender: M Patient Age:   81 years Exam Location:  [facility] Procedure:      VAS US CAROTID Referring Phys: Terrilee Files [facility] --------------------------------------------------------------------------------  Indications:       CVA. Risk Factors:      Hypertension, hyperlipidemia, Diabetes. Comparison Study:  11/17/2021 - Right Carotid: The extracranial vessels were                    near-normal with only minimal wall thickening or plaque.                     Left Carotid: The extracranial vessels were near-normal with                    only minimal wall thickening or plaque.                     Vertebrals: Bilateral vertebral arteries demonstrate  antegrade flow. Performing Technologist: Kurt Hernandez RVT  Examination Guidelines: A complete evaluation includes B-mode imaging, spectral Doppler, color Doppler, and power Doppler as needed of all accessible portions of each vessel. Bilateral testing is considered an integral part of a complete examination. Limited examinations for reoccurring indications may be performed as noted.  Right Carotid Findings: +----------+--------+--------+--------+-----------------------+--------+           PSV cm/sEDV cm/sStenosisPlaque Description     Comments +----------+--------+--------+--------+-----------------------+--------+ CCA Prox  135     16              smooth and heterogenoustortuous +----------+--------+--------+--------+-----------------------+--------+ CCA Distal70      11              smooth and heterogenous         +----------+--------+--------+--------+-----------------------+--------+ ICA Prox  47      10              smooth and heterogenous         +----------+--------+--------+--------+-----------------------+--------+ ICA Mid   64      18              smooth and heterogenous          +----------+--------+--------+--------+-----------------------+--------+ ICA Distal52      11                                     tortuous +----------+--------+--------+--------+-----------------------+--------+ ECA       59      10                                              +----------+--------+--------+--------+-----------------------+--------+ +----------+--------+-------+--------+-------------------+           PSV cm/sEDV cmsDescribeArm Pressure (mmHG) +----------+--------+-------+--------+-------------------+ [id number]                                        +----------+--------+-------+--------+-------------------+ +---------+--------+--+--------+--+ VertebralPSV cm/s57EDV cm/s12 +---------+--------+--+--------+--+  Left Carotid Findings: +----------+--------+--------+--------+-----------------------+--------+           PSV cm/sEDV cm/sStenosisPlaque Description     Comments +----------+--------+--------+--------+-----------------------+--------+ CCA Prox  70      14              smooth and heterogenoustortuous +----------+--------+--------+--------+-----------------------+--------+ CCA Distal52      11              smooth and heterogenous         +----------+--------+--------+--------+-----------------------+--------+ ICA Prox  49      12              smooth and heterogenous         +----------+--------+--------+--------+-----------------------+--------+ ICA Mid   65      22              smooth and heterogenoustortuous +----------+--------+--------+--------+-----------------------+--------+ ICA Distal60      16                                     tortuous +----------+--------+--------+--------+-----------------------+--------+ ECA       52      9                                               +----------+--------+--------+--------+-----------------------+--------+ +----------+--------+--------+--------+-------------------+  PSV cm/sEDV cm/sDescribeArm Pressure (mmHG) +----------+--------+--------+--------+-------------------+ [id number]                                         +----------+--------+--------+--------+-------------------+ +---------+--------+--+--------+-+---------+ VertebralPSV cm/s39EDV cm/s8Antegrade +---------+--------+--+--------+-+---------+   Summary: Right Carotid: Velocities in the right ICA are consistent with a 1-39% stenosis. Left Carotid: Velocities in the left ICA are consistent with a 1-39% stenosis. Vertebrals: Bilateral vertebral arteries demonstrate antegrade flow. *See table(s) above for measurements and observations.  Electronically signed by Waverly Ferrari MD on 06/14/2022 at 4:23:25 PM.    Final    ECHOCARDIOGRAM COMPLETE  Result Date: 06/14/2022    ECHOCARDIOGRAM REPORT   Patient Name:   Kurt Hernandez Date of Exam: 06/14/2022 Medical Rec #:  [id number]         Height:       74.0 in Accession #:    [id number]        Weight:       160.0 lb Date of Birth:  01-24-1940        BSA:          1.977 m Patient Age:    81 years          BP:           198/88 mmHg Patient Gender: M                 HR:           73 bpm. Exam Location:  Inpatient Procedure: 2D Echo, Cardiac Doppler and Color Doppler Indications:    cva  History:        Patient has no prior history of Echocardiogram examinations.                 Stroke; Risk Factors:Hypertension, Diabetes and Dyslipidemia.  Sonographer:    Melissa Morford RDCS (AE, PE) Referring Phys: [phone] JULIE HAVILAND IMPRESSIONS  1. Left ventricular ejection fraction, by estimation, is 50%. The left ventricle has low normal function. The left ventricle has no regional wall motion abnormalities. There is mild concentric left ventricular hypertrophy. Left ventricular diastolic parameters are consistent with Grade I diastolic dysfunction (impaired relaxation).  2. Right ventricular systolic function is normal. The right ventricular size is  normal.  3. The mitral valve is normal in structure. No evidence of mitral valve regurgitation. No evidence of mitral stenosis.  4. The aortic valve is normal in structure. Aortic valve regurgitation is not visualized. No aortic stenosis is present.  5. The inferior vena cava is normal in size with greater than 50% respiratory variability, suggesting right atrial pressure of 3 mmHg. FINDINGS  Left Ventricle: Left ventricular ejection fraction, by estimation, is 50 to 55%. The left ventricle has low normal function. The left ventricle has no regional wall motion abnormalities. The left ventricular internal cavity size was normal in size. There is mild concentric left ventricular hypertrophy. Left ventricular diastolic parameters are consistent with Grade I diastolic dysfunction (impaired relaxation). Right Ventricle: The right ventricular size is normal. No increase in right ventricular wall thickness. Right ventricular systolic function is normal. Left Atrium: Left atrial size was normal in size. Right Atrium: Right atrial size was normal in size. Pericardium: There is no evidence of pericardial effusion. Mitral Valve: The mitral valve is normal in structure. No evidence of mitral valve regurgitation. No evidence of mitral valve stenosis. Tricuspid Valve: The tricuspid valve is normal in  structure. Tricuspid valve regurgitation is not demonstrated. No evidence of tricuspid stenosis. Aortic Valve: The aortic valve is normal in structure. Aortic valve regurgitation is not visualized. No aortic stenosis is present. Pulmonic Valve: The pulmonic valve was not well visualized. Pulmonic valve regurgitation is not visualized. No evidence of pulmonic stenosis. Aorta: The aortic root is normal in size and structure. Venous: The inferior vena cava is normal in size with greater than 50% respiratory variability, suggesting right atrial pressure of 3 mmHg. IAS/Shunts: No atrial level shunt detected by color flow Doppler.  LEFT  VENTRICLE PLAX 2D LVIDd:         3.70 cm   Diastology LVIDs:         3.10 cm   LV e' medial:    6.09 cm/s LV PW:         1.10 cm   LV E/e' medial:  6.4 LV IVS:        1.10 cm   LV e' lateral:   9.14 cm/s LVOT diam:     2.20 cm   LV E/e' lateral: 4.2 LV SV:         61 LV SV Index:   31 LVOT Area:     3.80 cm  RIGHT VENTRICLE RV S prime:     15.90 cm/s TAPSE (M-mode): 3.0 cm LEFT ATRIUM           Index        RIGHT ATRIUM           Index LA diam:      3.50 cm 1.77 cm/m   RA Area:     20.10 cm LA Vol (A2C): 61.4 ml 31.06 ml/m  RA Volume:   60.60 ml  30.66 ml/m LA Vol (A4C): 58.1 ml 29.39 ml/m  AORTIC VALVE LVOT Vmax:   72.40 cm/s LVOT Vmean:  53.200 cm/s LVOT VTI:    0.161 m  AORTA Ao Root diam: 3.90 cm Ao Asc diam:  3.90 cm MITRAL VALVE MV Area (PHT): 2.15 cm    SHUNTS MV Decel Time: 353 msec    Systemic VTI:  0.16 m MV E velocity: 38.80 cm/s  Systemic Diam: 2.20 cm MV A velocity: 71.30 cm/s MV E/A ratio:  0.54 Aditya Sabharwal Electronically signed by Dorthula Nettles Signature Date/Time: 06/14/2022/12:25:46 PM    Final     PHYSICAL EXAM  Constitutional: Appears well-developed and well-nourished.   Cardiovascular: Normal rate and regular rhythm.  Respiratory: Effort normal, non-labored breathing  Neuro: Mental Status: Patient is drowsy but awakens to voice, oriented to person, place, month, year, and situation. Patient is able to give a clear and coherent history. No signs of aphasia or neglect Cranial Nerves: II: Visual Fields are full. Pupils are equal, round, and reactive to light.   III,IV, VI: EOMI without ptosis or diploplia.  V: Facial sensation is symmetric to temperature VII: Facial movement is symmetric resting and smiling VIII: Hearing is intact to voice X: Palate elevates symmetrically XI: Shoulder shrug is symmetric. XII: Tongue protrudes midline without atrophy or fasciculations.  Motor: Tone is normal. Bulk is normal.  RUE 5/5 LUE 4/5  RLE 5/5 LLE 4/5 Sensory: Sensation  is symmetric to light touch in the arms and legs. Cerebellar: FNF and HKS are intact bilaterally   ASSESSMENT/PLAN Kurt Hernandez is a 82 y.o. male with history of afib on eliquis, HTN, DM presenting with confusion.   Stroke:  right basal ganglia infarct Etiology:  embolic in the setting of  afib on eliquis   Code Stroke CT head No acute abnormality.  MRI  Acute infarct in the right basal ganglia  MRA Mild stenosis of the right MCA, M1 segment.  2D Echo LV EF 50% LDL 127 mg% HgbA1c 7.8 VTE prophylaxis - SCDs    Diet   Diet regular Room service appropriate? Yes with Assist; Fluid consistency: Thin   Eliquis (apixaban) daily prior to admission, now on Eliquis (apixaban) daily.  Therapy recommendations:  Outpatient PT/OT Disposition:  Pending   Atrial fibrillation Home medications: Eliquis 2.5mg  BID -> consider increasing to 5mg  BID  Hypertension Home meds:  Amlodipine Stable Permissive hypertension (OK if < 220/120) but gradually normalize in 5-7 days Long-term BP goal normotensive  Hyperlipidemia Home meds:  None, resumed in hospital LDL 127, goal < 70 Add Leqvio- form sent  Patient states he does not tolerate statins  Diabetes type II Controlled Home meds:  Levemir HgbA1c 7.8, goal < 7.0 CBGs Recent Labs    06/15/22 0605 06/15/22 0643 06/15/22 0657  GLUCAP 65* 56* 138*    SSI  Other Stroke Risk Factors Advanced Age >/= 80   Other Active Problems   Hospital day # 0  Patient seen and examined by NP/APP with MD. MD to update note as needed.   Elmer Picker, DNP, FNP-BC [facility] Pager: [phone]  STROKE MD NOTE :  I have personally obtained history,examined this patient, reviewed notes, independently viewed imaging studies, participated in medical decision making and plan of care.ROS completed by me personally and pertinent positives fully documented  I have made any additions or clarifications directly to the above note. Agree  with note above.  Patient with known atrial fibrillation on low-dose Eliquis 2.5 twice daily because of prior history of bleeding presents with left hemiparesis with MRI showing large right basal ganglia infarct.  Discussed anticoagulation options with patient and wife including increasing Eliquis to 5 twice daily versus changing to alternative medication like Xarelto or Pradaxa.  There is no definitive data suggesting which of the above treatment regimens was..  Patient would like appointment from their physician in [place] hence we will increase Eliquis to 5 mg twice daily for now and let them decide later.  Continue ongoing stroke workup.  Patient has history of intolerance from statins hence may consider switching to Leqvio injections after insurance approval.  More aggressive diabetes control.  Mobilize out of bed.  Therapy consults.  Greater than 50% time during this 50-minute visit was spent in counseling and coordination of care about his stroke and hemiparesis and atrial fibrillation and discussion about treatment options and answering questions.     Delia Heady, MD Medical Director [facility] Pager: [phone] 06/15/2022 4:06 PM   To contact Stroke Continuity provider, please refer to [email or web address]. After hours, contact General Neurology

## 2022-06-15 NOTE — Progress Notes (Signed)
Pt's CBG read 65 at 0605, orange juice (2 cups) given, repeated at 0643, read 56, D50% given at 0648, CBG read 138 thereafter, Dr Courtney Paris paged and notified, pt reassured, will continue to monitor. Obasogie-Asidi, Anatalia Kronk Efe

## 2022-06-15 NOTE — Hospital Course (Signed)
Kurt Hernandez is a 82 y.o. male with pertinent PMH of prior CVA, paroxysmal A-fib on Eliquis, history of remote provoked PE, recent GI bleed of unknown cause, CKD stage IIIb, HTN, T2DM, and hypothyroidism who presented with acute encephalopathy and is admitted for right basal ganglia infarct.   #Acute cerebral infarct in R basal ganglia  Presented with acute but mild encephalopathy that was resolved by the time we evaluated him in the ED.  Imaging showed an acute infarct in the right basal ganglia.  Likely secondary to microembolic or microvascular disease.  Multiple strokes in the past dating back to 2021 in the setting of uncontrolled hypertension, insulin-dependent diabetes mellitus, and paroxysmal atrial fibrillation.  Neurology was consulted for stroke workup. Carotid ultrasound WNL. TTE with EF of 50% and grade 1 diastolic dysfunction. He has been on Eliquis 2.5 mg twice daily at home but not on any antiplatelet therapy recently.  He was previously on Eliquis 5 mg twice daily but did have GI bleeding on this.  LDL 127 here.  He has had side effects with all statins so we will hold these for now but consider PCSK9 inhibitor outpatient. 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  Discharged with outpatient PT/OT/SLP   #Uncontrolled hypertension On amlodipine 5 mg daily at home. Given his recurrent strokes he likely would benefit from a more aggressive approach, although will have to keep in mind age/fall risk.  Was intermittently hypertensive and normotensive up to discharge.  Would recommend addition of ACE inhibitor or ARB if blood pressure remains elevated at ambulatory follow-up.   #Type II diabetes mellitus Last A1c 7.0% two months ago and 8.8 here.  He is on long-acting insulin at home and was put on a basal bolus regimen here.  He did have 1 episode of hypoglycemia.  Would recommend glucose log outpatient and retrial of any oral diabetes  medications.   #AKI on CKD stage IIIb Baseline creatinine appears to be 1.7.  Creatinine 2.21 on admission and down to 1.78 after rehydration.   #Paroxysmal atrial fibrillation Eliquis discussed above.  If unable to tolerate he may be considered for Watchman device.   #Anemia of chronic disease #History of GI bleed, unknown source Hgb stable 12.4. At that time had GI bleed of unknown source, has not had any symptoms since then and he has been taking his anticoagulation. Was to undergo colonoscopy, however given his age and co-morbid conditions would consider further discussing risks/benefits with him and his family.    #History of bilateral pulmonary embolism Per patient and family, this was discovered at the end of an hospitalization a few years ago. Likely provoked and doesn't require further anticoagulation for this indication (but should continue d/t atrial fibrillation).     #Hypothyroidism Home thyroid medication decreased from levothyroxine 100 mcg daily to 75 mcg daily.  Please recheck TSH 2 to 4 weeks.   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

## 2022-06-15 NOTE — Care Management Obs Status (Signed)
MEDICARE OBSERVATION STATUS NOTIFICATION   Patient Details  Name: Kurt Hernandez MRN: [id number] Date of Birth: 04-06-1940   Medicare Observation Status Notification Given:  Yes    Kermit Balo, RN 06/15/2022, 1:30 PM

## 2022-06-15 NOTE — Evaluation (Signed)
Speech Language Pathology Evaluation Patient Details Name: Kurt Hernandez MRN: [id number] DOB: 10-Oct-1940 Today's Date: 06/15/2022 Time: 4098-1191 SLP Time Calculation (min) (ACUTE ONLY): 28 min  Problem List:  Patient Active Problem List   Diagnosis Date Noted   Acute cerebrovascular accident (CVA) (HCC) 06/14/2022   Protein-calorie malnutrition, severe 03/09/2022   UGIB (upper gastrointestinal bleed) 03/07/2022   AKI (acute kidney injury) (HCC) 03/07/2022   Acute blood loss anemia 03/07/2022   GI bleed 03/07/2022   History of CVA (cerebrovascular accident) 10/03/2019   History of 2019 novel coronavirus disease (COVID-19) 09/26/2019   Acute respiratory failure due to COVID-19 (HCC) 09/20/2019   Type 2 diabetes mellitus with stage 3b chronic kidney disease, without long-term current use of insulin (HCC) 04/26/2019   History of bilateral pulmonary embolism 2021 04/16/2019   History of atrial fibrillation 04/16/2019   Stage 3a chronic kidney disease (CKD) (HCC) 01/01/2019   Hyperlipidemia, mixed 06/27/2018   Abdominal wall hernia 12/29/2017   Nephrolithiasis 12/29/2017   Preoperative cardiovascular examination 10/24/2017   Peroneal tendinitis 09/08/2017   Osteoarthritis of wrist 09/08/2017   Closed fracture of heel bone 09/08/2017   Osteoarthritis of carpometacarpal (CMC) joint of thumb 09/08/2017   Acquired hypothyroidism 11/21/2016   PVC's (premature ventricular contractions) 08/24/2016   Benign essential hypertension 11/19/2015   Cervical disc disorder with radiculopathy 11/19/2015   Lumbar radiculopathy 06/17/2013   Peripheral neuropathy 05/13/2013   Pernicious anemia 05/13/2013   Past Medical History:  Past Medical History:  Diagnosis Date   Anemia    pernicious anemia   Arrhythmia    patient unaware of diagnosis except heart skips beats   Cancer (HCC)    lip cancer   Complication of anesthesia    unable to void after surgery and had to stay overnight for several  days w/ cath   Diabetes mellitus without complication (HCC)    Headache    History of kidney stones    Hypertension    Kidney stone    Stroke [facility])    Past Surgical History:  Past Surgical History:  Procedure Laterality Date   CYSTOSCOPY W/ RETROGRADES Right 11/14/2017   Procedure: CYSTOSCOPY WITH RETROGRADE PYELOGRAM;  Surgeon: Riki Altes, MD;  Location: [facility] ORS;  Service: Urology;  Laterality: Right;   CYSTOSCOPY WITH STENT PLACEMENT Right 11/03/2017   Procedure: CYSTOSCOPY WITH STENT PLACEMENT;  Surgeon: Riki Altes, MD;  Location: [facility] ORS;  Service: Urology;  Laterality: Right;   CYSTOSCOPY/RETROGRADE/URETEROSCOPY Right 11/03/2017   Procedure: CYSTOSCOPY/RETROGRADE/URETEROSCOPY;  Surgeon: Riki Altes, MD;  Location: [facility] ORS;  Service: Urology;  Laterality: Right;   CYSTOSCOPY/URETEROSCOPY/HOLMIUM LASER/STENT PLACEMENT Right 11/14/2017   Procedure: CYSTOSCOPY/URETEROSCOPY/HOLMIUM LASER/STENT Exchange;  Surgeon: Riki Altes, MD;  Location: [facility] ORS;  Service: Urology;  Laterality: Right;  stent exchange   ESOPHAGOGASTRODUODENOSCOPY (EGD) WITH PROPOFOL N/A 03/10/2022   Procedure: ESOPHAGOGASTRODUODENOSCOPY (EGD) WITH PROPOFOL;  Surgeon: Wyline Mood, MD;  Location: [facility] ENDOSCOPY;  Service: Gastroenterology;  Laterality: N/A;   EYE SURGERY Right    cataract extraction.  needed yag laser and still has problems    HAND SURGERY Right 1989   tendons came undone from bones in wrist. metal removed   HERNIA REPAIR Bilateral 2010   inguinal hernias   HERNIA REPAIR     HPI:  Pt is an 82 y.o. male who presents for AMS. MRI revealed acute infarct in the right basal ganglia. PMHx: CVA, A-fib on Eliquis, hypertension, diabetes, pulmonary embolism, chronic kidney disease stage IIIb, hypothyroidism, GERD.  Assessment / Plan / Recommendation Clinical Impression  Pt seen for cognitive-linguistic evaluation. Evaluation completed via informal means and SLUMS. Pt scored 12/30 on  SLUMS. Pt presents with cognitive-linguistic deficits affecting attention, memory, problem solving, executive functioning and insight/safety awareness. Pt's speech is fluent and appropriate with occasional minimal articulatory imprecision with no appreciable impact on speech intelligibility. Pt reported hx of "memory issues " PTA with unused prescription for "dementia" medication. Family not present to provide additional details of baseline level of functioning. Based on today's assessment, recommend post-acute SLP services for above mentioned deficits. SLP to continue to f/u while pt in house.    SLP Assessment  SLP Recommendation/Assessment: Patient needs continued Speech Lanaguage Pathology Services SLP Visit Diagnosis: Cognitive communication deficit (R41.841)    Recommendations for follow up therapy are one component of a multi-disciplinary discharge planning process, led by the attending physician.  Recommendations may be updated based on patient status, additional functional criteria and insurance authorization.    Follow Up Recommendations   (f/u SLP services in line with OT/PT recommendations)    Assistance Recommended at Discharge  PRN  Functional Status Assessment Patient has had a recent decline in their functional status and demonstrates the ability to make significant improvements in function in a reasonable and predictable amount of time.  Frequency and Duration min 2x/week  2 weeks      SLP Evaluation Cognition  Overall Cognitive Status: Impaired/Different from baseline Arousal/Alertness: Awake/alert Orientation Level: Oriented to person;Oriented to place Memory: Impaired Memory Impairment: Storage deficit;Retrieval deficit;Decreased recall of new information Awareness: Impaired Awareness Impairment: Emergent impairment;Anticipatory impairment Problem Solving: Impaired Executive Function: Reasoning;Sequencing;Organizing;Self Monitoring;Self Correcting Reasoning:  Impaired Sequencing: Impaired Organizing: Impaired Self Monitoring: Impaired Self Correcting: Impaired Safety/Judgment: Impaired       Comprehension  Auditory Comprehension Overall Auditory Comprehension: Impaired Yes/No Questions: Within Functional Limits Commands: Impaired Two Step Basic Commands: 50-74% accurate Multistep Basic Commands: 50-74% accurate Conversation: Simple Interfering Components: Working Radio broadcast assistant: Repetition Counsellor: Not tested Reading Comprehension Reading Status: Not tested    Expression Expression Primary Mode of Expression: Verbal Verbal Expression Overall Verbal Expression: Impaired Initiation: No impairment Automatic Speech: Name;Social Response Level of Generative/Spontaneous Verbalization: Sentence;Conversation Repetition: No impairment Naming: Impairment Divergent:  (7 animals in 60s) Pragmatics: Impairment Impairments: Abnormal affect Interfering Components: Attention Written Expression Dominant Hand: Right   Oral / Motor  Oral Motor/Sensory Function Overall Oral Motor/Sensory Function: Mild impairment Facial ROM: Reduced left;Suspected CN VII (facial) dysfunction Facial Symmetry: Abnormal symmetry left;Suspected CN VII (facial) dysfunction Facial Strength: Reduced left;Suspected CN VII (facial) dysfunction Lingual Symmetry: Within Functional Limits Lingual Strength: Within Functional Limits Mandible: Within Functional Limits Motor Speech Overall Motor Speech: Impaired Respiration: Within functional limits Phonation: Normal Resonance: Within functional limits Articulation: Impaired Level of Impairment:  (all levels; minimally imprecise) Intelligibility: Intelligible Motor Planning: Witnin functional limits Interfering Components: Inadequate dentition         Clyde Canterbury, M.S., CCC-SLP Speech-Language Pathologist Secure Chat Preferred  O: [phone]    Woodroe Chen 06/15/2022, 10:49 AM

## 2022-06-15 NOTE — Progress Notes (Signed)
HD#0 Subjective:   Summary: Kurt Hernandez is a 82 y.o. male with pertinent PMH of prior CVA, paroxysmal A-fib on Eliquis, history of remote provoked PE, recent GI bleed of unknown cause, CKD stage IIIb, HTN, T2DM, and hypothyroidism who presented with acute encephalopathy and is admitted for right basal ganglia infarct.   Overall patient is stable from yesterday.  He did have a hypoglycemic event this morning with some mild confusion but after D50 he was back to baseline.  He frequently talks about racing when asked about other things.  He also admits to recurrent aspiration events at home with solids and liquids that have been about every week.  Objective:  Vital signs in last 24 hours: Vitals:   06/14/22 1700 06/14/22 2005 06/14/22 2338 06/15/22 0421  BP:  (!) 155/76 (!) 142/75 136/67  Pulse:  71 64 (!) 59  Resp:  18 17 18   Temp:  98.5 F (36.9 C) 98 F (36.7 C) 97.7 F (36.5 C)  TempSrc:  Oral Oral   SpO2:  100% 99% 98%  Weight: 67 kg     Height: 6\' 2"  (1.88 m)      Supplemental O2: Room Air SpO2: 98 %   Physical Exam:  Constitutional: Thin elderly male, in no acute distress Cardiovascular: regular rate and rhythm, palpable radial and dorsalis pedis pulses bilaterally Pulmonary/Chest: normal work of breathing on room air, lungs clear to auscultation bilaterally MSK: normal bulk and tone Neurological: alert & oriented x 3, mild left facial droop, otherwise cranial nerves II through XII intact.  5/5 strength in the bilateral upper extremities and right lower extremity, 4/5 strength in the left lower extremity hip flexion and knee flexion/extension.  Decree sensation in both feet consistent with diabetic neuropathy, otherwise sensation intact Skin: warm and dry  Filed Weights   06/13/22 2018 06/14/22 1700  Weight: 72.6 kg 67 kg      Intake/Output Summary (Last 24 hours) at 06/15/2022 0647 Last data filed at 06/14/2022 1550 Gross per 24 hour  Intake --  Output 900 ml   Net -900 ml   Net IO Since Admission: 100 mL [06/15/22 0647]  Pertinent Labs:    Latest Ref Rng & Units 06/13/2022    8:31 PM 03/11/2022    4:21 AM 03/10/2022    2:48 AM  CBC  WBC 4.0 - 10.5 K/uL 5.8  6.5  5.6   Hemoglobin 13.0 - 17.0 g/dL 16.1  9.8  9.1   Hematocrit 39.0 - 52.0 % 34.0  29.3  26.9   Platelets 150 - 400 K/uL 181  252  231        Latest Ref Rng & Units 06/13/2022    8:31 PM 03/11/2022    4:21 AM 03/10/2022    2:48 AM  CMP  Glucose 70 - 99 mg/dL 096  045  409   BUN 8 - 23 mg/dL 33  14  16   Creatinine 0.61 - 1.24 mg/dL 8.11  9.14  7.82   Sodium 135 - 145 mmol/L 133  136  135   Potassium 3.5 - 5.1 mmol/L 4.0  3.9  4.9   Chloride 98 - 111 mmol/L 103  105  105   CO2 22 - 32 mmol/L 24  25  27    Calcium 8.9 - 10.3 mg/dL 9.4  8.9  8.9   Total Protein 6.5 - 8.1 g/dL 6.4     Total Bilirubin 0.3 - 1.2 mg/dL 0.8     Alkaline  Phos 38 - 126 U/L 71     AST 15 - 41 U/L 14     ALT 0 - 44 U/L 12       Assessment/Plan:   Principal Problem:   Acute cerebrovascular accident (CVA) (HCC)   Patient Summary: Kurt Hernandez is a 82 y.o. male with pertinent PMH of prior CVA, paroxysmal A-fib on Eliquis, history of remote provoked PE, recent GI bleed of unknown cause, CKD stage IIIb, HTN, T2DM, and hypothyroidism who presented with acute encephalopathy and is admitted for right basal ganglia infarct, on hospital day 0.   #Acute cerebral infarct in R basal ganglia  Multiple strokes in the past dating back to 2021 in the setting of uncontrolled hypertension, insulin-dependent diabetes mellitus, and paroxysmal atrial fibrillation. Neurology has been consulted, appreciate their input.  Carotid ultrasound WNL.  TTE with EF of 50% and grade 1 diastolic dysfunction.  He has been on Eliquis 2.5 mg twice daily at home but not on any antiplatelet therapy recently.  His wife notes that his medications have been changed multiple times over the past year including stopping and starting Eliquis due  to bleeding on the 5 mg dose.  LDL 127 here.  He has had side effects with all statins so we will hold these for now but consider PCSK9 inhibitor. 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  - Permissive hypertension for now, call MD SBP >220 - PT/OT/SLP - Telemetry - Restart eliquis 5 mg bid per neuro - needs LDL management but refusing statin   #Uncontrolled hypertension On amlodipine 5 mg daily at home. Given his recurrent strokes he likely would benefit from a more aggressive approach, although will have to keep in mind age/fall risk. Will hold for permissive hypertension for now. - Hold home amlodipine   #Type II diabetes mellitus Last A1c 7.0% two months ago. Currently on long-acting insulin at home, we will continue with this here.  He did have a hypoglycemic event this morning so we will decrease his long-acting insulin. - Follow-up repeat A1c - Semglee 10u daily - SSI   #AKI on CKD stage IIIb Baseline creatinine appears to be 1.7.  Creatinine 2.21 on admission and down to 1.78 today after rehydration.   #Paroxysmal atrial fibrillation Eliquis discussed above.  If unable to tolerate he may be considered for Watchman device.   #Anemia of chronic disease #History of GI bleed, unknown source Hgb stable 12.4. At that time had GI bleed of unknown source, has not had any symptoms since then and he has been taking his anticoagulation. Was to undergo colonoscopy, however given his age and co-morbid conditions would consider further discussing risks/benefits with him and his family.  - Daily CBC   #History of bilateral pulmonary embolism Per patient and family, this was discovered at the end of an hospitalization a few years ago. Likely provoked and doesn't require further anticoagulation for this indication (but should continue d/t atrial fibrillation).     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   Dispo: Anticipated discharge to Skilled nursing facility vs Home in 1-3 days pending stability and placement.   Rocky Morel, DO Internal Medicine Resident PGY-1 Pager: [phone]  Please contact the on call pager after 5 pm and on weekends at [phone].

## 2022-06-15 NOTE — Evaluation (Addendum)
Physical Therapy Evaluation Patient Details Name: Kurt Hernandez MRN: [id number] DOB: July 06, 1940 Today's Date: 06/15/2022  History of Present Illness  Kurt Hernandez is a 82 y.o. male who presents for AMS. MRI revealed acute infarct in the right basal ganglia. PMHx: CVA, A-fib on Eliquis, hypertension, diabetes, pulmonary embolism, chronic kidney disease stage IIIb, hypothyroidism, GERD.   Clinical Impression  PTA pt and family reports mod I with functional mobility and functional ADL's, pt [place] works [place]. Pt with min G for transfers, gait, and stairs, amb 200 ft with SPC in RUE. Pt w slight L inattention during gait, requiring increased time and verbal cues to locate room numbers and identify them. Pt requiring standing break when asked to short-term recall three room numbers, 0/3 recalled. Pt gait pattern initially w symmetric and adequate step length but regresses to shuffling step pattern nearing end of amb. Pt reports brief "swimmy headedness" during ambulation to which PT suggested a standing break to monitor and assess. Pt reports moderate improvement within 1-2 minutes and continues to finish walk. Post amb BP recorded at 152/125 (134) w HR 67 BPM in seated, RUE used and tremors visible throughout recording, RN notified. Pt will continue to benefit from skilled therapy during their acute admission to facilitate improvements in the above impairments. PT to recommend OP PT.     Recommendations for follow up therapy are one component of a multi-disciplinary discharge planning process, led by the attending physician.  Recommendations may be updated based on patient status, additional functional criteria and insurance authorization.  Follow Up Recommendations       Assistance Recommended at Discharge None  Patient can return home with the following  A little help with walking and/or transfers;A little help with bathing/dressing/bathroom;Assistance with  cooking/housework;Direct supervision/assist for medications management;Assist for transportation;Help with stairs or ramp for entrance    Equipment Recommendations None recommended by PT  Recommendations for Other Services       Functional Status Assessment Patient has had a recent decline in their functional status and demonstrates the ability to make significant improvements in function in a reasonable and predictable amount of time.     Precautions / Restrictions Precautions Precautions: Fall Restrictions Weight Bearing Restrictions: No      Mobility  Bed Mobility                    Transfers Overall transfer level: Needs assistance Equipment used: Straight cane Transfers: Sit to/from Stand Sit to Stand: Min guard                Ambulation/Gait Ambulation/Gait assistance: Min guard   Assistive device: Straight cane Gait Pattern/deviations: Step-through pattern, Shuffle, Decreased stride length, Trunk flexed, Narrow base of support Gait velocity: reduced Gait velocity interpretation: <1.8 ft/sec, indicate of risk for recurrent falls   General Gait Details: Pt begins with symmetric and adequate step length but regresses to shuffling step over time. Able to navigate bilat turning with several small steps.  Stairs Stairs: Yes Stairs assistance: Min guard Stair Management: One rail Left, Step to pattern, With cane Number of Stairs: 4 General stair comments: Pt with L HR, RUE w SPC, step-to pattern on ascent and descent.  Wheelchair Mobility    Modified Rankin (Stroke Patients Only) Modified Rankin (Stroke Patients Only) Pre-Morbid Rankin Score: No symptoms     Balance Overall balance assessment: Needs assistance Sitting-balance support: No upper extremity supported, Feet supported Sitting balance-Leahy Scale: Good  Standing balance support: Single extremity supported, Reliant on assistive device for balance Standing balance-Leahy Scale:  Fair               High level balance activites: Head turns, Turns               Pertinent Vitals/Pain Pain Assessment Pain Assessment: No/denies pain    Home Living Family/patient expects to be discharged to:: Private residence Living Arrangements: Spouse/significant other Available Help at Discharge: Family Type of Home: House Home Access: Stairs to enter Entrance Stairs-Rails: Left Entrance Stairs-Number of Steps: 3 STE front, 4 STE garage Alternate Level Stairs-Number of Steps: Flight of step down to basement Home Layout: Two level;Able to live on main level with bedroom/bathroom Home Equipment: Cane - single point;Rolling Walker (2 wheels);Wheelchair - manual Additional Comments: Lives with wife who is retired and available 24/7    Prior Function Prior Level of Function : Independent/Modified Independent;Working/employed;Driving             Mobility Comments: Pt ambulates with mod I and use of a SPC at baseline ADLs Comments: Pt is independent with ADLs, works on his cattle farm, still driving. Wife assists with medications.     Hand Dominance   Dominant Hand: Right    Extremity/Trunk Assessment   Upper Extremity Assessment Upper Extremity Assessment: Overall WFL for tasks assessed LUE Deficits / Details: mild incoordination noted with reciprocal finger opposition, finger to nose, and dysdiadochokinesia    Lower Extremity Assessment Lower Extremity Assessment: RLE deficits/detail;LLE deficits/detail RLE Deficits / Details: Not formally assessed, at least 3/5 strength RLE Sensation: history of peripheral neuropathy LLE Deficits / Details: Same as RLE LLE Sensation: history of peripheral neuropathy    Cervical / Trunk Assessment Cervical / Trunk Assessment: Kyphotic  Communication   Communication: No difficulties  Cognition Arousal/Alertness: Awake/alert Behavior During Therapy: WFL for tasks assessed/performed Overall Cognitive Status:  Impaired/Different from baseline Area of Impairment: Memory, Awareness, Problem solving, Safety/judgement                     Memory: Decreased short-term memory   Safety/Judgement: Decreased awareness of deficits, Decreased awareness of safety Awareness: Emergent Problem Solving: Slow processing General Comments: Pt son reports pt with baseline memory deficits. Pt with 0/3 for short term recall of room numbers during ambulation. Pt with slight L innattention as well, increased time and verbal cues to attend to room numbers on L.        General Comments General comments (skin integrity, edema, etc.): Pt reports "swimmy headedness" midway through ambulation, standing break to monitor. BP post amb assessed through RUE, recorded at 152/125 (134) w HR 67 BPM, pt w tremors during recording.    Exercises     Assessment/Plan    PT Assessment Patient needs continued PT services  PT Problem List Decreased activity tolerance;Decreased balance;Decreased safety awareness       PT Treatment Interventions Gait training;Stair training;Functional mobility training;Therapeutic activities;Balance training    PT Goals (Current goals can be found in the Care Plan section)  Acute Rehab PT Goals Patient Stated Goal: Eager to return home PT Goal Formulation: With patient/family Time For Goal Achievement: 06/29/22 Potential to Achieve Goals: Good    Frequency Min 3X/week     Co-evaluation               AM-PAC PT "6 Clicks" Mobility  Outcome Measure Help needed turning from your back to your side while in a flat bed without using bedrails?:  A Little Help needed moving from lying on your back to sitting on the side of a flat bed without using bedrails?: A Little Help needed moving to and from a bed to a chair (including a wheelchair)?: A Little Help needed standing up from a chair using your arms (e.g., wheelchair or bedside chair)?: A Little Help needed to walk in hospital room?:  A Little Help needed climbing 3-5 steps with a railing? : A Little 6 Click Score: 18    End of Session Equipment Utilized During Treatment: Gait belt Activity Tolerance: Patient tolerated treatment well Patient left: in chair;with call bell/phone within reach;with chair alarm set Nurse Communication: Mobility status;Other (comment) (BP) PT Visit Diagnosis: Unsteadiness on feet (R26.81);Other abnormalities of gait and mobility (R26.89);Difficulty in walking, not elsewhere classified (R26.2)    Time: 1610-9604 PT Time Calculation (min) (ACUTE ONLY): 23 min   Charges:   PT Evaluation $PT Eval Low Complexity: 1 Low PT Treatments $Therapeutic Activity: 8-22 mins        Hendricks Milo, SPT  Acute Rehabilitation Services   Hendricks Milo 06/15/2022, 12:35 PM

## 2022-06-16 ENCOUNTER — Other Ambulatory Visit (HOSPITAL_COMMUNITY): Payer: Self-pay

## 2022-06-16 DIAGNOSIS — N179 Acute kidney failure, unspecified: Secondary | ICD-10-CM | POA: Diagnosis not present

## 2022-06-16 DIAGNOSIS — G238 Other specified degenerative diseases of basal ganglia: Secondary | ICD-10-CM | POA: Diagnosis not present

## 2022-06-16 DIAGNOSIS — I10 Essential (primary) hypertension: Secondary | ICD-10-CM | POA: Diagnosis not present

## 2022-06-16 DIAGNOSIS — E119 Type 2 diabetes mellitus without complications: Secondary | ICD-10-CM | POA: Diagnosis not present

## 2022-06-16 DIAGNOSIS — I639 Cerebral infarction, unspecified: Secondary | ICD-10-CM | POA: Diagnosis not present

## 2022-06-16 LAB — RENAL FUNCTION PANEL
Albumin: 3.1 g/dL — ABNORMAL LOW (ref 3.5–5.0)
Anion gap: 6 (ref 5–15)
BUN: 26 mg/dL — ABNORMAL HIGH (ref 8–23)
CO2: 24 mmol/L (ref 22–32)
Calcium: 9.4 mg/dL (ref 8.9–10.3)
Chloride: 104 mmol/L (ref 98–111)
Creatinine, Ser: 1.87 mg/dL — ABNORMAL HIGH (ref 0.61–1.24)
GFR, Estimated: 36 mL/min — ABNORMAL LOW (ref >60)
Glucose, Bld: 129 mg/dL — ABNORMAL HIGH (ref 70–99)
Phosphorus: 3.3 mg/dL (ref 2.5–4.6)
Potassium: 3.5 mmol/L (ref 3.5–5.1)
Sodium: 134 mmol/L — ABNORMAL LOW (ref 135–145)

## 2022-06-16 LAB — GLUCOSE, CAPILLARY
Glucose-Capillary: 74 mg/dL (ref 70–99)
Glucose-Capillary: 297 mg/dL — ABNORMAL HIGH (ref 70–99)

## 2022-06-16 MED ORDER — AMLODIPINE BESYLATE 5 MG PO TABS: 5.0000 mg | ORAL_TABLET | Freq: Every day | ORAL | Status: DC

## 2022-06-16 MED ORDER — LEVOTHYROXINE SODIUM 75 MCG PO TABS
75.0000 ug | ORAL_TABLET | Freq: Every day | ORAL | 0 refills | Status: DC
  Filled 2022-06-16: qty 30, 30d supply, fill #0

## 2022-06-16 MED ORDER — APIXABAN 5 MG PO TABS
5.0000 mg | ORAL_TABLET | Freq: Two times a day (BID) | ORAL | 0 refills | Status: DC
  Filled 2022-06-16: qty 60, 30d supply, fill #0

## 2022-06-16 NOTE — TOC Transition Note (Signed)
Transition of Care [facility]) - CM/SW Discharge Note   Patient Details  Name: Kurt Hernandez MRN: [id number] Date of Birth: April 05, 1940  [facility] CM/SW Contact:  Kermit Balo, RN Phone Number: 06/16/2022, 1:04 PM   Clinical Narrative:    Pt is discharging home with outpatient therapy. Information on the AVS.  Pt has transportation home.   Final next level of care: OP Rehab Barriers to Discharge: No Barriers Identified   Patient Goals and CMS Choice CMS Medicare.gov Compare Post Acute Care list provided to:: Patient Represenative (must comment) Choice offered to / list presented to : Spouse  Discharge Placement                         Discharge Plan and Services Additional resources added to the After Visit Summary for     Discharge Planning Services: CM Consult                                 Social Determinants of Health (SDOH) Interventions SDOH Screenings   Food Insecurity: No Food Insecurity (06/14/2022)  Housing: Low Risk  (06/14/2022)  Transportation Needs: No Transportation Needs (06/14/2022)  Utilities: Not At Risk (06/14/2022)  Tobacco Use: Low Risk  (06/13/2022)     Readmission Risk Interventions     No data to display

## 2022-06-16 NOTE — Discharge Summary (Addendum)
Name: ALEKAI ORSER MRN: [id number] DOB: 02/13/1940 82 y.o. PCP: Danella Penton, MD  Date of Admission: 06/13/2022  8:00 PM Date of Discharge: 06/16/2022 Attending Physician: Dr. Criselda Peaches  Discharge Diagnosis: Principal Problem:   Acute cerebrovascular accident (CVA) [facility])    Discharge Medications: Allergies as of 06/16/2022       Reactions   Mirtazapine Other (See Comments)   Other reaction(s): Dizziness-per wife, Patient has been tolerating 7.5 mg without dizziness.        Medication List     STOP taking these medications    ivermectin 3 MG Tabs tablet Commonly known as: STROMECTOL   meclizine 12.5 MG tablet Commonly known as: ANTIVERT       TAKE these medications    amLODipine 5 MG tablet Commonly known as: NORVASC Take 1 tablet by mouth at bedtime.   apixaban 5 MG Tabs tablet Commonly known as: ELIQUIS Take 1 tablet (5 mg total) by mouth 2 (two) times daily. What changed:  medication strength how much to take   Levemir 100 UNIT/ML injection Generic drug: insulin detemir Inject 5-20 Units into the skin every morning.   levothyroxine 75 MCG tablet Commonly known as: SYNTHROID Take 1 tablet (75 mcg total) by mouth daily at 6 (six) AM. Start taking on: June 17, 2022 What changed:  medication strength how much to take when to take this   omeprazole 40 MG capsule Commonly known as: PRILOSEC Take 40 mg by mouth 2 (two) times daily before a meal.        Disposition and follow-up:   Mr.Trae D Hyun was discharged from [facility] in Stable condition.  At the hospital follow up visit please address:  1.  Follow-up:  a.  Reevaluate Eliquis dosing.  He qualifies for 2.5 mg twice daily due to his age and renal function but he did have a stroke on this dose so he may be more appropriate to stay on the 5 mg twice daily dosing.  Please watch for any bleeding.    b.  Follow-up on lipid therapy.  He has not tolerated any statins and  would be a candidate for PCSK9 inhibitors.  Also discussed adding antihypertensive to his amlodipine if his blood pressure remains high in the ambulatory setting.  Ensure he has good follow-up with physical therapy, Occupational Therapy, and speech therapy.   2.  Labs / imaging needed at time of follow-up: None  3.  Pending labs/ test needing follow-up: None  Follow-up Appointments:  Follow-up Information      Outpatient Rehabilitation at [facility] Follow up.   Specialty: Rehabilitation Why: The outpatient rehab will contact you for the first appointment. Contact information: [address] [id number] [place] [address] [phone]                Hospital Course by problem list: DEONDRA CARLON is a 82 y.o. male with pertinent PMH of prior CVA, paroxysmal A-fib on Eliquis, history of remote provoked PE, recent GI bleed of unknown cause, CKD stage IIIb, HTN, T2DM, and hypothyroidism who presented with acute encephalopathy and is admitted for right basal ganglia infarct.   #Acute cerebral infarct in R basal ganglia  Presented with acute but mild encephalopathy that was resolved by the time we evaluated him in the ED.  Imaging showed an acute infarct in the right basal ganglia.  Likely secondary to microembolic or microvascular disease.  Multiple strokes in the past dating back to 2021  in the setting of uncontrolled hypertension, insulin-dependent diabetes mellitus, and paroxysmal atrial fibrillation.  Neurology was consulted for stroke workup. Carotid ultrasound WNL. TTE with EF of 50% and grade 1 diastolic dysfunction. He has been on Eliquis 2.5 mg twice daily at home but not on any antiplatelet therapy recently.  He was previously on Eliquis 5 mg twice daily but did have GI bleeding on this.  LDL 127 here.  He has had side effects with all statins so we will hold these for now but consider PCSK9 inhibitor outpatient. 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  Discharged with outpatient PT/OT/SLP   #Uncontrolled hypertension On amlodipine 5 mg daily at home. Given his recurrent strokes he likely would benefit from a more aggressive approach, although will have to keep in mind age/fall risk.  Was intermittently hypertensive and normotensive up to discharge.  Would recommend addition of ACE inhibitor or ARB if blood pressure remains elevated at ambulatory follow-up.   #Type II diabetes mellitus Last A1c 7.0% two months ago and 8.8 here.  He is on long-acting insulin at home and was put on a basal bolus regimen here.  He did have 1 episode of hypoglycemia.  Would recommend glucose log outpatient and retrial of any oral diabetes medications.   #AKI on CKD stage IIIb Baseline creatinine appears to be 1.7.  Creatinine 2.21 on admission and down to 1.78 after rehydration.   #Paroxysmal atrial fibrillation Eliquis discussed above.  If unable to tolerate he may be considered for Watchman device.   #Anemia of chronic disease #History of GI bleed, unknown source Hgb stable 12.4. At that time had GI bleed of unknown source, has not had any symptoms since then and he has been taking his anticoagulation. Was to undergo colonoscopy, however given his age and co-morbid conditions would consider further discussing risks/benefits with him and his family.    #History of bilateral pulmonary embolism Per patient and family, this was discovered at the end of an hospitalization a few years ago. Likely provoked and doesn't require further anticoagulation for this indication (but should continue d/t atrial fibrillation).     #Hypothyroidism Home thyroid medication decreased from levothyroxine 100 mcg daily to 75 mcg daily.  Please recheck TSH 2 to 4 weeks.   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   Discharge Subjective: Pt is stable  this morning without any new or worsening issues overnight. He is ready to get home and work with therapy.  Discharge Exam:   BP (!) 153/88 (BP Location: Right Arm)   Pulse 66   Temp 98.1 F (36.7 C) (Oral)   Resp 18   Ht 6\' 2"  (1.88 m)   Wt 67 kg   SpO2 95%   BMI 18.96 kg/m  Constitutional: Thin elderly male, in no acute distress Cardiovascular: regular rate and rhythm, palpable radial and dorsalis pedis pulses bilaterally Pulmonary/Chest: normal work of breathing on room air, lungs clear to auscultation bilaterally MSK: normal bulk and tone Neurological: alert & oriented x 3, mild left facial droop, otherwise cranial nerves II through XII intact.  5/5 strength in the bilateral upper extremities and right lower extremity, 4/5 strength in the left lower extremity hip flexion and knee flexion/extension.  Decree sensation in both feet consistent with diabetic neuropathy, otherwise sensation intact Skin: warm and dry  Pertinent Labs, Studies, and Procedures:     Latest Ref Rng &  Units 06/15/2022    7:06 AM 06/13/2022    8:31 PM 03/11/2022    4:21 AM  CBC  WBC 4.0 - 10.5 K/uL 7.2  5.8  6.5   Hemoglobin 13.0 - 17.0 g/dL 40.9  81.1  9.8   Hematocrit 39.0 - 52.0 % 34.9  34.0  29.3   Platelets 150 - 400 K/uL 171  181  252        Latest Ref Rng & Units 06/16/2022    9:08 AM 06/15/2022    7:06 AM 06/13/2022    8:31 PM  CMP  Glucose 70 - 99 mg/dL 914  782  956   BUN 8 - 23 mg/dL 26  24  33   Creatinine 0.61 - 1.24 mg/dL 2.13  0.86  5.78   Sodium 135 - 145 mmol/L 134  135  133   Potassium 3.5 - 5.1 mmol/L 3.5  3.4  4.0   Chloride 98 - 111 mmol/L 104  103  103   CO2 22 - 32 mmol/L 24  24  24    Calcium 8.9 - 10.3 mg/dL 9.4  9.5  9.4   Total Protein 6.5 - 8.1 g/dL   6.4   Total Bilirubin 0.3 - 1.2 mg/dL   0.8   Alkaline Phos 38 - 126 U/L   71   AST 15 - 41 U/L   14   ALT 0 - 44 U/L   12     VAS US CAROTID  Result Date: 06/14/2022 Carotid Arterial Duplex Study Patient Name:  KONER FRERKING  Date of Exam:   06/14/2022 Medical Rec #: [id number]          Accession #:    [id number] Date of Birth: 1940-04-27         Patient Gender: M Patient Age:   80 years Exam Location:  [facility] Procedure:      VAS US CAROTID Referring Phys: Terrilee Files [facility] --------------------------------------------------------------------------------  Indications:       CVA. Risk Factors:      Hypertension, hyperlipidemia, Diabetes. Comparison Study:  11/17/2021 - Right Carotid: The extracranial vessels were                    near-normal with only minimal wall thickening or plaque.                     Left Carotid: The extracranial vessels were near-normal with                    only minimal wall thickening or plaque.                     Vertebrals: Bilateral vertebral arteries demonstrate                    antegrade flow. Performing Technologist: Chanda Busing RVT  Examination Guidelines: A complete evaluation includes B-mode imaging, spectral Doppler, color Doppler, and power Doppler as needed of all accessible portions of each vessel. Bilateral testing is considered an integral part of a complete examination. Limited examinations for reoccurring indications may be performed as noted.  Right Carotid Findings: +----------+--------+--------+--------+-----------------------+--------+           PSV cm/sEDV cm/sStenosisPlaque Description     Comments +----------+--------+--------+--------+-----------------------+--------+ CCA Prox  135     16              smooth and heterogenoustortuous +----------+--------+--------+--------+-----------------------+--------+ CCA Distal70  11              smooth and heterogenous         +----------+--------+--------+--------+-----------------------+--------+ ICA Prox  47      10              smooth and heterogenous         +----------+--------+--------+--------+-----------------------+--------+ ICA Mid   64      18              smooth and  heterogenous         +----------+--------+--------+--------+-----------------------+--------+ ICA Distal52      11                                     tortuous +----------+--------+--------+--------+-----------------------+--------+ ECA       59      10                                              +----------+--------+--------+--------+-----------------------+--------+ +----------+--------+-------+--------+-------------------+           PSV cm/sEDV cmsDescribeArm Pressure (mmHG) +----------+--------+-------+--------+-------------------+ [id number]                                        +----------+--------+-------+--------+-------------------+ +---------+--------+--+--------+--+ VertebralPSV cm/s57EDV cm/s12 +---------+--------+--+--------+--+  Left Carotid Findings: +----------+--------+--------+--------+-----------------------+--------+           PSV cm/sEDV cm/sStenosisPlaque Description     Comments +----------+--------+--------+--------+-----------------------+--------+ CCA Prox  70      14              smooth and heterogenoustortuous +----------+--------+--------+--------+-----------------------+--------+ CCA Distal52      11              smooth and heterogenous         +----------+--------+--------+--------+-----------------------+--------+ ICA Prox  49      12              smooth and heterogenous         +----------+--------+--------+--------+-----------------------+--------+ ICA Mid   65      22              smooth and heterogenoustortuous +----------+--------+--------+--------+-----------------------+--------+ ICA Distal60      16                                     tortuous +----------+--------+--------+--------+-----------------------+--------+ ECA       52      9                                               +----------+--------+--------+--------+-----------------------+--------+  +----------+--------+--------+--------+-------------------+           PSV cm/sEDV cm/sDescribeArm Pressure (mmHG) +----------+--------+--------+--------+-------------------+ [id number]                                         +----------+--------+--------+--------+-------------------+ +---------+--------+--+--------+-+---------+ VertebralPSV cm/s39EDV cm/s8Antegrade +---------+--------+--+--------+-+---------+   Summary: Right Carotid: Velocities in the right ICA are consistent with  a 1-39% stenosis. Left Carotid: Velocities in the left ICA are consistent with a 1-39% stenosis. Vertebrals: Bilateral vertebral arteries demonstrate antegrade flow. *See table(s) above for measurements and observations.  Electronically signed by Waverly Ferrari MD on 06/14/2022 at 4:23:25 PM.    Final    ECHOCARDIOGRAM COMPLETE  Result Date: 06/14/2022    ECHOCARDIOGRAM REPORT   Patient Name:   BRITAN EIGNER Date of Exam: 06/14/2022 Medical Rec #:  [id number]         Height:       74.0 in Accession #:    [id number]        Weight:       160.0 lb Date of Birth:  [birth year]        BSA:          1.977 m Patient Age:    81 years          BP:           198/88 mmHg Patient Gender: M                 HR:           73 bpm. Exam Location:  Inpatient Procedure: 2D Echo, Cardiac Doppler and Color Doppler Indications:    cva  History:        Patient has no prior history of Echocardiogram examinations.                 Stroke; Risk Factors:Hypertension, Diabetes and Dyslipidemia.  Sonographer:    Melissa Morford RDCS (AE, PE) Referring Phys: [phone] JULIE HAVILAND IMPRESSIONS  1. Left ventricular ejection fraction, by estimation, is 50%. The left ventricle has low normal function. The left ventricle has no regional wall motion abnormalities. There is mild concentric left ventricular hypertrophy. Left ventricular diastolic parameters are consistent with Grade I diastolic dysfunction (impaired relaxation).  2. Right ventricular  systolic function is normal. The right ventricular size is normal.  3. The mitral valve is normal in structure. No evidence of mitral valve regurgitation. No evidence of mitral stenosis.  4. The aortic valve is normal in structure. Aortic valve regurgitation is not visualized. No aortic stenosis is present.  5. The inferior vena cava is normal in size with greater than 50% respiratory variability, suggesting right atrial pressure of 3 mmHg. FINDINGS  Left Ventricle: Left ventricular ejection fraction, by estimation, is 50 to 55%. The left ventricle has low normal function. The left ventricle has no regional wall motion abnormalities. The left ventricular internal cavity size was normal in size. There is mild concentric left ventricular hypertrophy. Left ventricular diastolic parameters are consistent with Grade I diastolic dysfunction (impaired relaxation). Right Ventricle: The right ventricular size is normal. No increase in right ventricular wall thickness. Right ventricular systolic function is normal. Left Atrium: Left atrial size was normal in size. Right Atrium: Right atrial size was normal in size. Pericardium: There is no evidence of pericardial effusion. Mitral Valve: The mitral valve is normal in structure. No evidence of mitral valve regurgitation. No evidence of mitral valve stenosis. Tricuspid Valve: The tricuspid valve is normal in structure. Tricuspid valve regurgitation is not demonstrated. No evidence of tricuspid stenosis. Aortic Valve: The aortic valve is normal in structure. Aortic valve regurgitation is not visualized. No aortic stenosis is present. Pulmonic Valve: The pulmonic valve was not well visualized. Pulmonic valve regurgitation is not visualized. No evidence of pulmonic stenosis. Aorta: The aortic root is normal in size and structure. Venous: The inferior vena  cava is normal in size with greater than 50% respiratory variability, suggesting right atrial pressure of 3 mmHg. IAS/Shunts: No  atrial level shunt detected by color flow Doppler.  LEFT VENTRICLE PLAX 2D LVIDd:         3.70 cm   Diastology LVIDs:         3.10 cm   LV e' medial:    6.09 cm/s LV PW:         1.10 cm   LV E/e' medial:  6.4 LV IVS:        1.10 cm   LV e' lateral:   9.14 cm/s LVOT diam:     2.20 cm   LV E/e' lateral: 4.2 LV SV:         61 LV SV Index:   31 LVOT Area:     3.80 cm  RIGHT VENTRICLE RV S prime:     15.90 cm/s TAPSE (M-mode): 3.0 cm LEFT ATRIUM           Index        RIGHT ATRIUM           Index LA diam:      3.50 cm 1.77 cm/m   RA Area:     20.10 cm LA Vol (A2C): 61.4 ml 31.06 ml/m  RA Volume:   60.60 ml  30.66 ml/m LA Vol (A4C): 58.1 ml 29.39 ml/m  AORTIC VALVE LVOT Vmax:   72.40 cm/s LVOT Vmean:  53.200 cm/s LVOT VTI:    0.161 m  AORTA Ao Root diam: 3.90 cm Ao Asc diam:  3.90 cm MITRAL VALVE MV Area (PHT): 2.15 cm    SHUNTS MV Decel Time: 353 msec    Systemic VTI:  0.16 m MV E velocity: 38.80 cm/s  Systemic Diam: 2.20 cm MV A velocity: 71.30 cm/s MV E/A ratio:  0.54 Aditya Sabharwal Electronically signed by Dorthula Nettles Signature Date/Time: 06/14/2022/12:25:46 PM    Final    MR BRAIN WO CONTRAST  Result Date: 06/14/2022 CLINICAL DATA:  Neuro deficit, acute, stroke suspected. EXAM: MRI HEAD WITHOUT CONTRAST MRA HEAD WITHOUT CONTRAST TECHNIQUE: Multiplanar, multi-echo pulse sequences of the brain and surrounding structures were acquired without intravenous contrast. Angiographic images of the Circle of Willis were acquired using MRA technique without intravenous contrast. COMPARISON:  Head CT 06/13/2022. FINDINGS: MRI HEAD FINDINGS Brain: Acute infarct in the right basal ganglia. No acute hemorrhage or significant mass effect. Underlying moderate chronic small-vessel disease with scattered old lacunar infarcts in the bilateral basal ganglia, thalami, pons and cerebral white matter. Sequela of prior hemorrhage in the right caudate. Few, scattered chronic microhemorrhages. No hydrocephalus or extra-axial  collection. Vascular: Normal flow voids. Skull and upper cervical spine: Normal marrow signal. Joint effusions in the right-greater-than-left occipital condylar-C1 joints. Sinuses/Orbits: Unremarkable. Other: None. MRA HEAD FINDINGS Anterior circulation: Intracranial ICAs are patent without stenosis or aneurysm. Mild stenosis of the right MCA, M1 segment. The left MCA and bilateral ACAs are patent proximally without stenosis or aneurysm. Distal branches are symmetric. Posterior circulation: Visualized portions of the distal vertebral arteries and basilar artery are patent without stenosis or aneurysm. The SCAs, AICAs and PICAs are patent proximally. The PCAs are patent proximally without stenosis or aneurysm. Distal branches are symmetric. Anatomic variants: Hypoplastic A1 segment of the right ACA. IMPRESSION: 1. Acute infarct in the right basal ganglia. No acute hemorrhage or significant mass effect. 2. Mild stenosis of the right MCA, M1 segment. Electronically Signed   By: Elwyn Reach.D.  On: 06/14/2022 08:32   MR ANGIO HEAD WO CONTRAST  Result Date: 06/14/2022 CLINICAL DATA:  Neuro deficit, acute, stroke suspected. EXAM: MRI HEAD WITHOUT CONTRAST MRA HEAD WITHOUT CONTRAST TECHNIQUE: Multiplanar, multi-echo pulse sequences of the brain and surrounding structures were acquired without intravenous contrast. Angiographic images of the Circle of Willis were acquired using MRA technique without intravenous contrast. COMPARISON:  Head CT 06/13/2022. FINDINGS: MRI HEAD FINDINGS Brain: Acute infarct in the right basal ganglia. No acute hemorrhage or significant mass effect. Underlying moderate chronic small-vessel disease with scattered old lacunar infarcts in the bilateral basal ganglia, thalami, pons and cerebral white matter. Sequela of prior hemorrhage in the right caudate. Few, scattered chronic microhemorrhages. No hydrocephalus or extra-axial collection. Vascular: Normal flow voids. Skull and upper  cervical spine: Normal marrow signal. Joint effusions in the right-greater-than-left occipital condylar-C1 joints. Sinuses/Orbits: Unremarkable. Other: None. MRA HEAD FINDINGS Anterior circulation: Intracranial ICAs are patent without stenosis or aneurysm. Mild stenosis of the right MCA, M1 segment. The left MCA and bilateral ACAs are patent proximally without stenosis or aneurysm. Distal branches are symmetric. Posterior circulation: Visualized portions of the distal vertebral arteries and basilar artery are patent without stenosis or aneurysm. The SCAs, AICAs and PICAs are patent proximally. The PCAs are patent proximally without stenosis or aneurysm. Distal branches are symmetric. Anatomic variants: Hypoplastic A1 segment of the right ACA. IMPRESSION: 1. Acute infarct in the right basal ganglia. No acute hemorrhage or significant mass effect. 2. Mild stenosis of the right MCA, M1 segment. Electronically Signed   By: Orvan Falconer M.D.   On: 06/14/2022 08:32   CT Head Wo Contrast  Result Date: 06/13/2022 CLINICAL DATA:  Confusion. EXAM: CT HEAD WITHOUT CONTRAST TECHNIQUE: Contiguous axial images were obtained from the base of the skull through the vertex without intravenous contrast. RADIATION DOSE REDUCTION: This exam was performed according to the departmental dose-optimization program which includes automated exposure control, adjustment of the mA and/or kV according to patient size and/or use of iterative reconstruction technique. COMPARISON:  CT and MR examination dated September 14, 2021 FINDINGS: Brain: No evidence of acute infarction, hemorrhage, hydrocephalus, extra-axial collection or mass lesion/mass effect. Right basal ganglia chronic infarct and centrum semiovale. Vascular: No hyperdense vessel or unexpected calcification. Skull: Normal. Negative for fracture or focal lesion. Sinuses/Orbits: No acute finding. Other: None. IMPRESSION: 1. No acute intracranial pathology. 2. Chronic right MCA  territory infarct. Electronically Signed   By: Larose Hires D.O.   On: 06/13/2022 22:18     Discharge Instructions: Discharge Instructions     Ambulatory referral to Occupational Therapy   Complete by: As directed    Ambulatory referral to Physical Therapy   Complete by: As directed    Ambulatory referral to Speech Therapy   Complete by: As directed    Call MD for:  difficulty breathing, headache or visual disturbances   Complete by: As directed    Call MD for:  extreme fatigue   Complete by: As directed    Call MD for:  persistant dizziness or light-headedness   Complete by: As directed    Call MD for:  persistant nausea and vomiting   Complete by: As directed    Call MD for:  severe uncontrolled pain   Complete by: As directed    Call MD for:  temperature >100.4   Complete by: As directed    Diet - low sodium heart healthy   Complete by: As directed    Increase activity slowly   Complete  by: As directed        Signed: Rocky Morel, DO 06/16/2022, 1:31 PM   Pager: [phone]

## 2022-06-16 NOTE — Progress Notes (Signed)
Pt discharged to home in stable condition, all discharge instructions and prescriptions reviewed with and given to pt.  Pt and wife gives complete verbal understanding of discharge instructions and prescriptions from [facility] pharmacy. Pt transported off unit via wheelchair, with staff x1 at chairside for transport home in private vehicle.

## 2022-06-16 NOTE — Discharge Instructions (Addendum)
Kurt Hernandez,  You were recently admitted to [facility] for a stroke.  We are happy that the confusion you came in with improved when you are here.  Please follow-up with your primary care provider in the next week and follow-up with neurology as well.  Continue taking your home medications with the following changes  Start taking Amlodipine 5 mg twice a day Levothyroxine 75 mcg daily Stop taking Ivermectin Meclizine.  Please talk to your primary care provider before restarting this medication.  This medication can cause increased risk of falls, confusion, and urinary retention Continue taking your other medications as prescribed   You should seek further medical care if you have any return of your symptoms or new symptoms.  We recommend that you see your primary care doctor in about a week to make sure that you continue to improve. We are so glad that you are feeling better.  Sincerely, Rocky Morel, DO

## 2022-06-16 NOTE — Inpatient Diabetes Management (Signed)
Inpatient Diabetes Program Recommendations  AACE/ADA: New Consensus Statement on Inpatient Glycemic Control   Target Ranges:  Prepandial:   less than 140 mg/dL      Peak postprandial:   less than 180 mg/dL (1-2 hours)      Critically ill patients:  140 - 180 mg/dL    Latest Reference Range & Units 06/16/22 06:15 06/16/22 12:07  Glucose-Capillary 70 - 99 mg/dL 74 629 (H)    Latest Reference Range & Units 06/15/22 06:05 06/15/22 06:43 06/15/22 06:57 06/15/22 11:35 06/15/22 16:41 06/15/22 21:30  Glucose-Capillary 70 - 99 mg/dL 65 (L) 56 (L) 528 (H) 413 (H) 134 (H) 157 (H)   Review of Glycemic Control  Current orders for Inpatient glycemic control: Semglee 10 units QHS, Novolog 0-9 units TID with meals  Inpatient Diabetes Program Recommendations:    Insulin: Fasting CBG 74 mg/dl this morning. May want to consider decreasing Semglee to 7 units QHS.  Thanks, Orlando Penner, RN, MSN, CDCES Diabetes Coordinator Inpatient Diabetes Program [phone] (Team Pager from 8am to 5pm)

## 2022-06-20 ENCOUNTER — Emergency Department: Payer: Medicare Other

## 2022-06-20 ENCOUNTER — Other Ambulatory Visit: Payer: Self-pay

## 2022-06-20 ENCOUNTER — Encounter: Payer: Self-pay | Admitting: Emergency Medicine

## 2022-06-20 ENCOUNTER — Inpatient Hospital Stay
Admission: EM | Admit: 2022-06-20 | Discharge: 2022-06-23 | DRG: 872 | Disposition: A | Discharge status: Home Health | Payer: Medicare Other | Attending: Internal Medicine | Admitting: Internal Medicine

## 2022-06-20 DIAGNOSIS — Z8616 Personal history of COVID-19: Secondary | ICD-10-CM

## 2022-06-20 DIAGNOSIS — N400 Enlarged prostate without lower urinary tract symptoms: Secondary | ICD-10-CM | POA: Diagnosis present

## 2022-06-20 DIAGNOSIS — G9341 Metabolic encephalopathy: Secondary | ICD-10-CM | POA: Diagnosis present

## 2022-06-20 DIAGNOSIS — Z85819 Personal history of malignant neoplasm of unspecified site of lip, oral cavity, and pharynx: Secondary | ICD-10-CM | POA: Diagnosis not present

## 2022-06-20 DIAGNOSIS — I69351 Hemiplegia and hemiparesis following cerebral infarction affecting right dominant side: Secondary | ICD-10-CM

## 2022-06-20 DIAGNOSIS — I493 Ventricular premature depolarization: Secondary | ICD-10-CM | POA: Diagnosis present

## 2022-06-20 DIAGNOSIS — R29810 Facial weakness: Secondary | ICD-10-CM | POA: Diagnosis present

## 2022-06-20 DIAGNOSIS — Z79890 Hormone replacement therapy (postmenopausal): Secondary | ICD-10-CM | POA: Diagnosis not present

## 2022-06-20 DIAGNOSIS — Z7901 Long term (current) use of anticoagulants: Secondary | ICD-10-CM | POA: Diagnosis not present

## 2022-06-20 DIAGNOSIS — Z794 Long term (current) use of insulin: Secondary | ICD-10-CM

## 2022-06-20 DIAGNOSIS — I129 Hypertensive chronic kidney disease with stage 1 through stage 4 chronic kidney disease, or unspecified chronic kidney disease: Secondary | ICD-10-CM | POA: Diagnosis present

## 2022-06-20 DIAGNOSIS — Z1152 Encounter for screening for COVID-19: Secondary | ICD-10-CM

## 2022-06-20 DIAGNOSIS — N1831 Chronic kidney disease, stage 3a: Secondary | ICD-10-CM | POA: Diagnosis present

## 2022-06-20 DIAGNOSIS — K922 Gastrointestinal hemorrhage, unspecified: Secondary | ICD-10-CM | POA: Diagnosis present

## 2022-06-20 DIAGNOSIS — N1832 Chronic kidney disease, stage 3b: Secondary | ICD-10-CM | POA: Diagnosis present

## 2022-06-20 DIAGNOSIS — N390 Urinary tract infection, site not specified: Principal | ICD-10-CM

## 2022-06-20 DIAGNOSIS — I480 Paroxysmal atrial fibrillation: Secondary | ICD-10-CM | POA: Diagnosis present

## 2022-06-20 DIAGNOSIS — B9620 Unspecified Escherichia coli [E. coli] as the cause of diseases classified elsewhere: Secondary | ICD-10-CM | POA: Diagnosis present

## 2022-06-20 DIAGNOSIS — E1122 Type 2 diabetes mellitus with diabetic chronic kidney disease: Secondary | ICD-10-CM | POA: Diagnosis present

## 2022-06-20 DIAGNOSIS — Z87442 Personal history of urinary calculi: Secondary | ICD-10-CM

## 2022-06-20 DIAGNOSIS — Z86711 Personal history of pulmonary embolism: Secondary | ICD-10-CM | POA: Diagnosis not present

## 2022-06-20 DIAGNOSIS — A419 Sepsis, unspecified organism: Secondary | ICD-10-CM | POA: Diagnosis present

## 2022-06-20 DIAGNOSIS — R300 Dysuria: Secondary | ICD-10-CM | POA: Diagnosis present

## 2022-06-20 DIAGNOSIS — E039 Hypothyroidism, unspecified: Secondary | ICD-10-CM | POA: Diagnosis present

## 2022-06-20 DIAGNOSIS — R7989 Other specified abnormal findings of blood chemistry: Secondary | ICD-10-CM | POA: Diagnosis present

## 2022-06-20 DIAGNOSIS — N179 Acute kidney failure, unspecified: Secondary | ICD-10-CM | POA: Diagnosis present

## 2022-06-20 DIAGNOSIS — E43 Unspecified severe protein-calorie malnutrition: Secondary | ICD-10-CM | POA: Diagnosis present

## 2022-06-20 DIAGNOSIS — Z888 Allergy status to other drugs, medicaments and biological substances status: Secondary | ICD-10-CM

## 2022-06-20 DIAGNOSIS — N200 Calculus of kidney: Secondary | ICD-10-CM

## 2022-06-20 DIAGNOSIS — Z8679 Personal history of other diseases of the circulatory system: Secondary | ICD-10-CM

## 2022-06-20 DIAGNOSIS — I10 Essential (primary) hypertension: Secondary | ICD-10-CM | POA: Diagnosis present

## 2022-06-20 DIAGNOSIS — Z79899 Other long term (current) drug therapy: Secondary | ICD-10-CM | POA: Diagnosis not present

## 2022-06-20 DIAGNOSIS — Z8249 Family history of ischemic heart disease and other diseases of the circulatory system: Secondary | ICD-10-CM | POA: Diagnosis not present

## 2022-06-20 DIAGNOSIS — I639 Cerebral infarction, unspecified: Secondary | ICD-10-CM | POA: Diagnosis present

## 2022-06-20 DIAGNOSIS — I2699 Other pulmonary embolism without acute cor pulmonale: Secondary | ICD-10-CM | POA: Diagnosis present

## 2022-06-20 LAB — URINALYSIS, W/ REFLEX TO CULTURE (INFECTION SUSPECTED)
Bilirubin Urine: NEGATIVE
Glucose, UA: 150 mg/dL — AB
Ketones, ur: 5 mg/dL — AB
Nitrite: POSITIVE — AB
Protein, ur: 100 mg/dL — AB
Specific Gravity, Urine: 1.011 (ref 1.005–1.030)
WBC, UA: >50 WBC/hpf (ref 0–5)
pH: 6 (ref 5.0–8.0)

## 2022-06-20 LAB — RESP PANEL BY RT-PCR (RSV, FLU A&B, COVID)  RVPGX2
Influenza A by PCR: NEGATIVE
Influenza B by PCR: NEGATIVE
Resp Syncytial Virus by PCR: NEGATIVE
SARS Coronavirus 2 by RT PCR: NEGATIVE

## 2022-06-20 LAB — COMPREHENSIVE METABOLIC PANEL
ALT: 12 U/L (ref 0–44)
AST: 26 U/L (ref 15–41)
Albumin: 3.4 g/dL — ABNORMAL LOW (ref 3.5–5.0)
Alkaline Phosphatase: 69 U/L (ref 38–126)
Anion gap: 10 (ref 5–15)
BUN: 25 mg/dL — ABNORMAL HIGH (ref 8–23)
CO2: 21 mmol/L — ABNORMAL LOW (ref 22–32)
Calcium: 9.1 mg/dL (ref 8.9–10.3)
Chloride: 103 mmol/L (ref 98–111)
Creatinine, Ser: 1.95 mg/dL — ABNORMAL HIGH (ref 0.61–1.24)
GFR, Estimated: 34 mL/min — ABNORMAL LOW (ref >60)
Glucose, Bld: 254 mg/dL — ABNORMAL HIGH (ref 70–99)
Potassium: 3.6 mmol/L (ref 3.5–5.1)
Sodium: 134 mmol/L — ABNORMAL LOW (ref 135–145)
Total Bilirubin: 1.1 mg/dL (ref 0.3–1.2)
Total Protein: 6.2 g/dL — ABNORMAL LOW (ref 6.5–8.1)

## 2022-06-20 LAB — TROPONIN I (HIGH SENSITIVITY)
Troponin I (High Sensitivity): 27 ng/L — ABNORMAL HIGH (ref <18)
Troponin I (High Sensitivity): 31 ng/L — ABNORMAL HIGH (ref <18)

## 2022-06-20 LAB — CBC WITH DIFFERENTIAL/PLATELET
Abs Immature Granulocytes: 0.08 10*3/uL — ABNORMAL HIGH (ref 0.00–0.07)
Basophils Absolute: 0 10*3/uL (ref 0.0–0.1)
Basophils Relative: 0 %
Eosinophils Absolute: 0 10*3/uL (ref 0.0–0.5)
Eosinophils Relative: 0 %
HCT: 34 % — ABNORMAL LOW (ref 39.0–52.0)
Hemoglobin: 11.6 g/dL — ABNORMAL LOW (ref 13.0–17.0)
Immature Granulocytes: 1 %
Lymphocytes Relative: 3 %
Lymphs Abs: 0.4 10*3/uL — ABNORMAL LOW (ref 0.7–4.0)
MCH: 30.4 pg (ref 26.0–34.0)
MCHC: 34.1 g/dL (ref 30.0–36.0)
MCV: 89 fL (ref 80.0–100.0)
Monocytes Absolute: 0.9 10*3/uL (ref 0.1–1.0)
Monocytes Relative: 6 %
Neutro Abs: 13.9 10*3/uL — ABNORMAL HIGH (ref 1.7–7.7)
Neutrophils Relative %: 90 %
Platelets: 172 10*3/uL (ref 150–400)
RBC: 3.82 MIL/uL — ABNORMAL LOW (ref 4.22–5.81)
RDW: 12.2 % (ref 11.5–15.5)
WBC: 15.3 10*3/uL — ABNORMAL HIGH (ref 4.0–10.5)
nRBC: 0 % (ref 0.0–0.2)

## 2022-06-20 LAB — LACTIC ACID, PLASMA
Lactic Acid, Venous: 1.8 mmol/L (ref 0.5–1.9)
Lactic Acid, Venous: 1.9 mmol/L (ref 0.5–1.9)

## 2022-06-20 MED ORDER — SODIUM CHLORIDE 0.9 % IV SOLN
2.0000 g | INTRAVENOUS | Status: DC
  Administered 2022-06-20: 2 g via INTRAVENOUS
  Filled 2022-06-20: qty 20

## 2022-06-20 MED ORDER — LACTATED RINGERS IV BOLUS (SEPSIS)
1000.0000 mL | Freq: Once | INTRAVENOUS | Status: AC
  Administered 2022-06-20: 1000 mL via INTRAVENOUS

## 2022-06-20 MED ORDER — SODIUM CHLORIDE 0.9 % IV SOLN
2.0000 g | INTRAVENOUS | Status: DC
  Administered 2022-06-21: 2 g via INTRAVENOUS
  Filled 2022-06-20: qty 12.5

## 2022-06-20 MED ORDER — SODIUM CHLORIDE 0.9% FLUSH
3.0000 mL | Freq: Two times a day (BID) | INTRAVENOUS | Status: DC
  Administered 2022-06-20 – 2022-06-23 (×6): 3 mL via INTRAVENOUS

## 2022-06-20 MED ORDER — APIXABAN 5 MG PO TABS
5.0000 mg | ORAL_TABLET | Freq: Two times a day (BID) | ORAL | Status: DC
  Administered 2022-06-20 – 2022-06-23 (×6): 5 mg via ORAL
  Filled 2022-06-20 (×6): qty 1

## 2022-06-20 MED ORDER — AMLODIPINE BESYLATE 5 MG PO TABS
5.0000 mg | ORAL_TABLET | Freq: Every day | ORAL | Status: DC
  Administered 2022-06-20 – 2022-06-22 (×3): 5 mg via ORAL
  Filled 2022-06-20 (×3): qty 1

## 2022-06-20 MED ORDER — LEVOTHYROXINE SODIUM 50 MCG PO TABS
75.0000 ug | ORAL_TABLET | Freq: Every day | ORAL | Status: DC
  Administered 2022-06-21 – 2022-06-23 (×3): 75 ug via ORAL
  Filled 2022-06-20 (×2): qty 2
  Filled 2022-06-20: qty 1

## 2022-06-20 NOTE — Sepsis Progress Note (Signed)
Sepsis protocol monitored by eLink ?

## 2022-06-20 NOTE — ED Notes (Addendum)
Pt resting comfortably

## 2022-06-20 NOTE — H&P (Incomplete)
History and Physical    Patient: Kurt Hernandez:[id number] DOB: [birth year] DOA: 06/20/2022 DOS: the patient was seen and examined on 06/21/2022 PCP: Danella Penton, MD  Patient coming from: Home  Chief Complaint:  Chief Complaint  Patient presents with   Weakness    HPI: Kurt Hernandez is a 82 y.o. male with medical history significant for hypertension, recent CVA, anemia Presenting with complaints of dysuria.  Niece at bedside states that he had symptoms of dysuria prior to going to the hospital for His weakness and being found to have a stroke.  Home PT OT has not yet started patient was just discharged.  Patient is alert awake oriented afebrile. States he feels" like crap" patient still has left facial droop and some mild weakness but in general is mobile.  No other acute pain chest pain palpitations shortness of breath  chills hematuria nausea vomiting back pain flank pain.  Per ED report patient was noted to be febrile at 102.7 and was given a liter of normal saline prior to coming to the emergency room.  Code sepsis was activated as patient met possible sepsis criteria due to fever and vitals.  Blood and urine cultures collected in the emergency room in the emergency room initial glucose in urine was 254, CMP sodium 1 34-1.95 EGFR 31 normal LFTs otherwise. Serum creatinine is marginally worse than previous and patient has a history of stones as well.  Initial troponin of 27 and repeat 31.  CBC also shows leukocytosis of 15.3 hemoglobin 11.6 which is chronic and normal platelet count of 172.  Urinalysis is abnormal with large leukocytes positive nitrite urine no more than 50 WBCs and blood.  Respiratory panel negative for COVID RSV and influenza.  CT renal protocol showed enlarged prostate and to severe  cystitis. Pt given rocephin. EKG shows  a.fib / pvc lvh .   Review of Systems: Review of Systems  Genitourinary:  Positive for dysuria.  Neurological:  Positive for weakness.   All other systems reviewed and are negative.  Past Medical History:  Diagnosis Date   Acute blood loss anemia 03/07/2022   Acute respiratory failure due to COVID-19 [facility]) 09/20/2019   Anemia    pernicious anemia   Arrhythmia    patient unaware of diagnosis except heart skips beats   Cancer (HCC)    lip cancer   Complication of anesthesia    unable to void after surgery and had to stay overnight for several days w/ cath   Diabetes mellitus without complication (HCC)    Headache    History of 2019 novel coronavirus disease (COVID-19) 09/26/2019   Formatting of this note might be different from the original. 9/21   History of CVA (cerebrovascular accident) 10/03/2019   Formatting of this note might be different from the original. Right basal ganglia stroke post Covid, 9/21 Right corona radiata infarct, 7/23 Right lateral thalamus, 8/23   History of kidney stones    Hypertension    Kidney stone    Stroke [facility])    UGIB (upper gastrointestinal bleed) 03/07/2022   Past Surgical History:  Procedure Laterality Date   CYSTOSCOPY W/ RETROGRADES Right 11/14/2017   Procedure: CYSTOSCOPY WITH RETROGRADE PYELOGRAM;  Surgeon: Riki Altes, MD;  Location: [facility] ORS;  Service: Urology;  Laterality: Right;   CYSTOSCOPY WITH STENT PLACEMENT Right 11/03/2017   Procedure: CYSTOSCOPY WITH STENT PLACEMENT;  Surgeon: Riki Altes, MD;  Location: [facility] ORS;  Service: Urology;  Laterality: Right;  CYSTOSCOPY/RETROGRADE/URETEROSCOPY Right 11/03/2017   Procedure: CYSTOSCOPY/RETROGRADE/URETEROSCOPY;  Surgeon: Riki Altes, MD;  Location: [facility] ORS;  Service: Urology;  Laterality: Right;   CYSTOSCOPY/URETEROSCOPY/HOLMIUM LASER/STENT PLACEMENT Right 11/14/2017   Procedure: CYSTOSCOPY/URETEROSCOPY/HOLMIUM LASER/STENT Exchange;  Surgeon: Riki Altes, MD;  Location: [facility] ORS;  Service: Urology;  Laterality: Right;  stent exchange   ESOPHAGOGASTRODUODENOSCOPY (EGD) WITH PROPOFOL N/A 03/10/2022    Procedure: ESOPHAGOGASTRODUODENOSCOPY (EGD) WITH PROPOFOL;  Surgeon: Wyline Mood, MD;  Location: [facility] ENDOSCOPY;  Service: Gastroenterology;  Laterality: N/A;   EYE SURGERY Right    cataract extraction.  needed yag laser and still has problems    HAND SURGERY Right 1989   tendons came undone from bones in wrist. metal removed   HERNIA REPAIR Bilateral 2010   inguinal hernias   HERNIA REPAIR     Social History:  reports that he has never smoked. He has never used smokeless tobacco. He reports that he does not drink alcohol and does not use drugs.  Allergies  Allergen Reactions   Mirtazapine Other (See Comments)    Other reaction(s): Dizziness-per wife, Patient has been tolerating 7.5 mg without dizziness.    Family History  Problem Relation Age of Onset   Heart attack Father    Stroke Mother     Prior to Admission medications   Medication Sig Start Date End Date Taking? Authorizing Provider  amLODipine (NORVASC) 5 MG tablet Take 1 tablet by mouth at bedtime. 04/28/22 04/28/23 Yes [provider]  apixaban (ELIQUIS) 5 MG TABS tablet Take 1 tablet (5 mg total) by mouth 2 (two) times daily. 06/16/22  Yes Rocky Morel, DO  LEVEMIR 100 UNIT/ML injection Inject 5-20 Units into the skin every morning. 04/05/19  Yes [provider]  levothyroxine (SYNTHROID) 75 MCG tablet Take 1 tablet (75 mcg total) by mouth daily at 6 (six) AM. 06/17/22  Yes Rocky Morel, DO  omeprazole (PRILOSEC) 40 MG capsule Take 40 mg by mouth 2 (two) times daily before a meal. 04/16/19  Yes [provider]     Vitals:   06/20/22 2230 06/20/22 2233 06/20/22 2300 06/20/22 2330  BP: (!) 150/75  (!) 148/68 (!) 149/86  Pulse:      Resp:      Temp:  98.7 F (37.1 C)    TempSrc:  Oral    SpO2:      Weight:      Height:       Physical Exam Vitals and nursing note reviewed.  Constitutional:      General: He is not in acute distress. HENT:     Head: Normocephalic and atraumatic.      Right Ear: Hearing normal.     Left Ear: Hearing normal.     Nose: Nose normal. No nasal deformity.     Mouth/Throat:     Lips: Pink.     Tongue: No lesions.     Pharynx: Oropharynx is clear.  Eyes:     General: Lids are normal. No visual field deficit.    Extraocular Movements: Extraocular movements intact.  Cardiovascular:     Rate and Rhythm: Normal rate. Rhythm irregular.     Heart sounds: Normal heart sounds.  Pulmonary:     Effort: Pulmonary effort is normal.     Breath sounds: Normal breath sounds.  Abdominal:     General: Bowel sounds are normal. There is no distension.     Palpations: Abdomen is soft. There is no mass.     Tenderness: There is no abdominal  tenderness.  Musculoskeletal:     Right lower leg: No edema.     Left lower leg: No edema.  Skin:    General: Skin is warm.  Neurological:     General: No focal deficit present.     Mental Status: He is alert and oriented to person, place, and time.     Cranial Nerves: Facial asymmetry present. No cranial nerve deficit or dysarthria.     Motor: Weakness present.     Coordination: Finger-Nose-Finger Test normal.     Deep Tendon Reflexes:     Reflex Scores:      Bicep reflexes are 1+ on the right side and 1+ on the left side.      Patellar reflexes are 1+ on the right side and 1+ on the left side.    Comments: Left facial droop.    Psychiatric:        Attention and Perception: Attention normal.        Mood and Affect: Mood normal.        Speech: Speech normal.        Behavior: Behavior normal. Behavior is cooperative.     Labs on Admission: I have personally reviewed following labs and imaging studies  CBC: Recent Labs  Lab 06/15/22 0706 06/20/22 1702  WBC 7.2 15.3*  NEUTROABS  --  13.9*  HGB 12.4* 11.6*  HCT 34.9* 34.0*  MCV 88.6 89.0  PLT 171 172   Basic Metabolic Panel: Recent Labs  Lab 06/15/22 0706 06/16/22 0908 06/20/22 1702  NA 135 134* 134*  K 3.4* 3.5 3.6  CL 103 104 103  CO2 24 24  21*  GLUCOSE 163* 129* 254*  BUN 24* 26* 25*  CREATININE 1.78* 1.87* 1.95*  CALCIUM 9.5 9.4 9.1  MG 1.9  --   --   PHOS 3.6 3.3  --    GFR: Estimated Creatinine Clearance: 29.2 mL/min (A) (by C-G formula based on SCr of 1.95 mg/dL (H)). Liver Function Tests: Recent Labs  Lab 06/15/22 0706 06/16/22 0908 06/20/22 1702  AST  --   --  26  ALT  --   --  12  ALKPHOS  --   --  69  BILITOT  --   --  1.1  PROT  --   --  6.2*  ALBUMIN 3.1* 3.1* 3.4*   No results for input(s): "LIPASE", "AMYLASE" in the last 168 hours. No results for input(s): "AMMONIA" in the last 168 hours. Coagulation Profile: No results for input(s): "INR", "PROTIME" in the last 168 hours. Cardiac Enzymes: No results for input(s): "CKTOTAL", "CKMB", "CKMBINDEX", "TROPONINI" in the last 168 hours. BNP (last 3 results) No results for input(s): "PROBNP" in the last 8760 hours. HbA1C: No results for input(s): "HGBA1C" in the last 72 hours. CBG: Recent Labs  Lab 06/15/22 1135 06/15/22 1641 06/15/22 2130 06/16/22 0615 06/16/22 1207  GLUCAP 194* 134* 157* 74 297*   Lipid Profile: No results for input(s): "CHOL", "HDL", "LDLCALC", "TRIG", "CHOLHDL", "LDLDIRECT" in the last 72 hours. Thyroid Function Tests: No results for input(s): "TSH", "T4TOTAL", "FREET4", "T3FREE", "THYROIDAB" in the last 72 hours. Anemia Panel: No results for input(s): "VITAMINB12", "FOLATE", "FERRITIN", "TIBC", "IRON", "RETICCTPCT" in the last 72 hours. Urine analysis:    Component Value Date/Time   COLORURINE YELLOW (A) 06/20/2022 1702   APPEARANCEUR CLOUDY (A) 06/20/2022 1702   APPEARANCEUR Clear 06/26/2019 1040   LABSPEC 1.011 06/20/2022 1702   PHURINE 6.0 06/20/2022 1702   GLUCOSEU 150 (A) 06/20/2022 1702  HGBUR LARGE (A) 06/20/2022 1702   BILIRUBINUR NEGATIVE 06/20/2022 1702   BILIRUBINUR Negative 06/26/2019 1040   KETONESUR 5 (A) 06/20/2022 1702   PROTEINUR 100 (A) 06/20/2022 1702   NITRITE POSITIVE (A) 06/20/2022 1702    LEUKOCYTESUR LARGE (A) 06/20/2022 1702    Radiological Exams on Admission: CT RENAL STONE STUDY  Result Date: 06/20/2022 CLINICAL DATA:  Burning with urination. EXAM: CT ABDOMEN AND PELVIS WITHOUT CONTRAST TECHNIQUE: Multidetector CT imaging of the abdomen and pelvis was performed following the standard protocol without IV contrast. RADIATION DOSE REDUCTION: This exam was performed according to the departmental dose-optimization program which includes automated exposure control, adjustment of the mA and/or kV according to patient size and/or use of iterative reconstruction technique. COMPARISON:  September 14, 2021 FINDINGS: Lower chest: Mild atelectasis is seen within the posterior aspects of the bilateral lung bases. Hepatobiliary: A stable 16 mm x 12 mm hepatic cyst is seen. No gallstones, gallbladder wall thickening, or biliary dilatation. Pancreas: Unremarkable. No pancreatic ductal dilatation or surrounding inflammatory changes. Spleen: Normal in size without focal abnormality. Adrenals/Urinary Tract: Adrenal glands are unremarkable. Kidneys are normal, without renal calculi, focal lesion, or hydronephrosis. The urinary bladder is poorly distended and subsequently limited in evaluation. Moderate to marked severity diffuse urinary bladder wall thickening is seen with moderate severity surrounding inflammatory fat stranding. Stomach/Bowel: There is a small hiatal hernia. Appendix appears normal. No evidence of bowel wall thickening, distention, or inflammatory changes. Noninflamed diverticula are seen throughout the large bowel. Vascular/Lymphatic: Aortic atherosclerosis. No enlarged abdominal or pelvic lymph nodes. Reproductive: There is moderate to marked severity prostate gland enlargement. Other: Multiple surgical coils are seen within the bilateral inguinal regions. No abdominopelvic ascites. Musculoskeletal: Multilevel degenerative changes are noted throughout the lumbar spine. IMPRESSION: 1. Findings  consistent with moderate to marked severity cystitis. Correlation with urinalysis is recommended. 2. Colonic diverticulosis. 3. Small hiatal hernia. 4. Stable hepatic cyst.  No follow-up imaging is recommended. 5. Moderate to marked severity prostatomegaly. Correlation with PSA levels is recommended. 6. Aortic atherosclerosis. Aortic Atherosclerosis (ICD10-I70.0). Electronically Signed   By: Aram Candela M.D.   On: 06/20/2022 20:02   DG Chest Port 1 View  Result Date: 06/20/2022 CLINICAL DATA:  Possible UTI EXAM: PORTABLE CHEST 1 VIEW COMPARISON:  09/14/2021 FINDINGS: Cardiac and mediastinal contours are within normal limits. No new focal pulmonary opacity. No pleural effusion or pneumothorax. Redemonstrated hyperinflation. No acute osseous abnormality. IMPRESSION: No acute cardiopulmonary process. Electronically Signed   By: Wiliam Ke M.D.   On: 06/20/2022 18:18     Data Reviewed: Relevant notes from primary care and specialist visits, past discharge summaries as available in EHR, including Care Everywhere. Prior diagnostic testing as pertinent to current admission diagnoses Updated medications and problem lists for reconciliation ED course, including vitals, labs, imaging, treatment and response to treatment Triage notes, nursing and pharmacy notes and ED provider's notes Notable results as noted in HPI Assessment and Plan: * Dysuria Patient presenting with dysuria per niece at bedside patient had symptoms of dysuria even before he went to the hospital when he was diagnosed with a stroke. Urinalysis today shows. Urinalysis    Component Value Date/Time   COLORURINE YELLOW (A) 06/20/2022 1702   APPEARANCEUR CLOUDY (A) 06/20/2022 1702   APPEARANCEUR Clear 06/26/2019 1040   LABSPEC 1.011 06/20/2022 1702   PHURINE 6.0 06/20/2022 1702   GLUCOSEU 150 (A) 06/20/2022 1702   HGBUR LARGE (A) 06/20/2022 1702   BILIRUBINUR NEGATIVE 06/20/2022 1702   BILIRUBINUR Negative 06/26/2019  1040    KETONESUR 5 (A) 06/20/2022 1702   PROTEINUR 100 (A) 06/20/2022 1702   NITRITE POSITIVE (A) 06/20/2022 1702   LEUKOCYTESUR LARGE (A) 06/20/2022 1702  Consistent with urinary tract infection patient also meets sepsis criteria.     Sepsis secondary to UTI [facility]) As above patient presenting with large leukocyte esterase and nitrite positive urine consistent with urinary tract infection patient also noted to have 21-50 RBCs and more than 50 WBCs with few bacteria.  Suspect this is a hospital-acquired infection as patient was recently discharged for an acute stroke. Patient initially was given Rocephin in the emergency room. We will change to cefepime. Will follow culture and sensitivity and tailor antibiotics further. Additional supportive measures with antipyretics.   Nephrolithiasis Patient has a history of kidney stones and stents in the past. With mild worsening of the creatinine to 1.95 and his sepsis UTI presentation we will obtain CT renal protocol to evaluate for any obstruction. Lab Results  Component Value Date   CREATININE 1.95 (H) 06/20/2022   CREATININE 1.87 (H) 06/16/2022   CREATININE 1.78 (H) 06/15/2022  CT renal stone study today is abnormal showing moderate to marked severe cystitis, moderate to marked prostatomegaly with correlation of PSA recommended, colonic diverticulosis, small hiatal hernia aortic atherosclerosis stable hepatic cyst otherwise.   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  Current serum creatinine of 1.95 Will avoid any contrast study.  Renally dose needed medications.   Acute cerebrovascular accident (CVA) [facility]) Patient was admitted on 4 June and discharged on 16 June 2022 after being admitted for acute encephalopathy and found to have right basal ganglia infarct.  Patient had a history of GI bleed of unknown source and had been taking Eliquis at that time and was to undergo colonoscopy which has been delayed.   Currently patient is on Eliquis 5 mg twice daily.  Neurologically he has weakness and upper extremity but is nonfocal otherwise. No reports of swallowing difficulty or dysphagia no reports of incontinence or paresthesias.  No reports of bleeding.   History of atrial fibrillation EKG today shows A-fib with a heart rate of 83, PVCs, LVH otherwise normal axis and intervals. Cont with eliquis.   Benign essential hypertension Vitals:   06/20/22 1634 06/20/22 1800 06/20/22 2230 06/20/22 2300  BP: (!) 186/68 133/80 (!) 150/75 (!) 148/68   06/20/22 2330  BP: (!) 149/86  Patient is currently on amlodipine 5 mg. Will add as needed hydralazine 5 mg every 6 hours with systolic blood pressure goals of 140s.   History of bilateral pulmonary embolism 2021 Continue with eliquis.  Type 2 diabetes mellitus with stage 3b chronic kidney disease, without long-term current use of insulin (HCC) Glycemic protocol, carb consistent cardiac diet.  A1c 6 days ago was 8.8.   Elevated troponin EKG today shows A-fib with a heart rate of 83 no ST-T wave changes. Suspect secondary to AKI on CKD and or sepsis. Will avoid contrast renally dose needed medications.  PVC's (premature ventricular contractions) Pt has PVC and mildly elevated TNI 2/2 to renal dysfunction, or sepsis.  No complaint of Chest pain or pressure or SOB.   Acquired hypothyroidism Continue levothyroxine at 75 mcg.  GI bleed H/o Anemia and s/p EGD. LGI eval pending.     Latest Ref Rng & Units 06/20/2022    5:02 PM 06/15/2022    7:06 AM 06/13/2022    8:31 PM  CBC  WBC 4.0 - 10.5 K/uL 15.3  7.2  5.8   Hemoglobin 13.0 - 17.0 g/dL 16.1  09.6  04.5   Hematocrit 39.0 - 52.0 % 34.0  34.9  34.0   Platelets 150 - 400 K/uL 172  171  181   Currently stable.      DVT prophylaxis:  Eliquis  Consults:  None  Advance Care Planning:    Code Status: Full Code   Family Communication:  Niece and wife at bedside  Disposition Plan:  Back to  previous home environment  Severity of Illness: The appropriate patient status for this patient is INPATIENT. Inpatient status is judged to be reasonable and necessary in order to provide the required intensity of service to ensure the patient's safety. The patient's presenting symptoms, physical exam findings, and initial radiographic and laboratory data in the context of their chronic comorbidities is felt to place them at high risk for further clinical deterioration. Furthermore, it is not anticipated that the patient will be medically stable for discharge from the hospital within 2 midnights of admission.   * I certify that at the point of admission it is my clinical judgment that the patient will require inpatient hospital care spanning beyond 2 midnights from the point of admission due to high intensity of service, high risk for further deterioration and high frequency of surveillance required.*  Author: Gertha Calkin, MD 06/21/2022 2:00 AM  For on call review [email or web address].

## 2022-06-20 NOTE — ED Provider Notes (Signed)
[facility] Provider Note    Event Date/Time   First MD Initiated Contact with Patient 06/20/22 1627     (approximate)   History   Weakness   HPI  Kurt Hernandez is a 82 y.o. male with a history of CVA, paroxysmal atrial fibrillation on Eliquis, remote provoked PE, GI bleed, CKD, hypertension, type 2 diabetes, and hypothyroidism who presents with generalized weakness, gradual onset over the last few days.  The patient did not get out of bed all day today.  He denies feeling dizzy or lightheaded but just feels weak.  He denies any acute pain.  EMS noted a foul odor of urine that concern him for UTI.  He was found to be febrile and was given a liter of normal saline prior to arrival in the ED.  I reviewed the past medical records.  The patient was admitted between 6/4 and 6/6 to the hospitalist service due to right basal ganglia acute cerebral infarct and uncontrolled hypertension.   Physical Exam   Triage Vital Signs: ED Triage Vitals  Enc Vitals Group     BP      Pulse      Resp      Temp      Temp src      SpO2      Weight      Height      Head Circumference      Peak Flow      Pain Score      Pain Loc      Pain Edu?      Excl. in GC?     Most recent vital signs: Vitals:   06/20/22 2230 06/20/22 2233  BP: (!) 150/75   Pulse:    Resp:    Temp:  98.7 F (37.1 C)  SpO2:       General: Alert and oriented, weak appearing but in no distress.  CV:  Good peripheral perfusion.  Normal heart sounds. Resp:  Slightly increased effort.  Lungs CTAB. Abd:  Soft and nontender.  No distention.  Other:  EOMI.  PERRLA.  5/5 motor strength and intact sensation of bilateral upper and lower extremities.  Moist mucous membranes.   ED Results / Procedures / Treatments   Labs (all labs ordered are listed, but only abnormal results are displayed) Labs Reviewed  COMPREHENSIVE METABOLIC PANEL - Abnormal; Notable for the following components:       Result Value   Sodium 134 (*)    CO2 21 (*)    Glucose, Bld 254 (*)    BUN 25 (*)    Creatinine, Ser 1.95 (*)    Total Protein 6.2 (*)    Albumin 3.4 (*)    GFR, Estimated 34 (*)    All other components within normal limits  CBC WITH DIFFERENTIAL/PLATELET - Abnormal; Notable for the following components:   WBC 15.3 (*)    RBC 3.82 (*)    Hemoglobin 11.6 (*)    HCT 34.0 (*)    Neutro Abs 13.9 (*)    Lymphs Abs 0.4 (*)    Abs Immature Granulocytes 0.08 (*)    All other components within normal limits  URINALYSIS, W/ REFLEX TO CULTURE (INFECTION SUSPECTED) - Abnormal; Notable for the following components:   Color, Urine YELLOW (*)    APPearance CLOUDY (*)    Glucose, UA 150 (*)    Hgb urine dipstick LARGE (*)    Ketones, ur 5 (*)  Protein, ur 100 (*)    Nitrite POSITIVE (*)    Leukocytes,Ua LARGE (*)    Bacteria, UA FEW (*)    Non Squamous Epithelial PRESENT (*)    All other components within normal limits  TROPONIN I (HIGH SENSITIVITY) - Abnormal; Notable for the following components:   Troponin I (High Sensitivity) 27 (*)    All other components within normal limits  TROPONIN I (HIGH SENSITIVITY) - Abnormal; Notable for the following components:   Troponin I (High Sensitivity) 31 (*)    All other components within normal limits  RESP PANEL BY RT-PCR (RSV, FLU A&B, COVID)  RVPGX2  CULTURE, BLOOD (ROUTINE X 2)  CULTURE, BLOOD (ROUTINE X 2)  URINE CULTURE  LACTIC ACID, PLASMA  LACTIC ACID, PLASMA  PROTIME-INR  CORTISOL-AM, BLOOD  PROCALCITONIN     EKG  ED ECG REPORT I, Dionne Bucy, the attending physician, personally viewed and interpreted this ECG.  Date: 06/20/2022 EKG Time: 1637 Rate: 83 Rhythm: normal sinus rhythm with PVCs (incorrectly read by machine as atrial fibrillation) QRS Axis: normal Intervals: normal ST/T Wave abnormalities: normal Narrative Interpretation: no evidence of acute ischemia    RADIOLOGY  Chest x-ray: I independently  viewed and interpreted the images; there is no focal consolidation or edema   PROCEDURES:  Critical Care performed: Yes, see critical care procedure note(s)  .Critical Care  Performed by: Dionne Bucy, MD Authorized by: Dionne Bucy, MD   Critical care provider statement:    Critical care time (minutes):  30   Critical care time was exclusive of:  Separately billable procedures and treating other patients   Critical care was necessary to treat or prevent imminent or life-threatening deterioration of the following conditions:  Sepsis   Critical care was time spent personally by me on the following activities:  Development of treatment plan with patient or surrogate, discussions with consultants, evaluation of patient's response to treatment, examination of patient, ordering and review of laboratory studies, ordering and review of radiographic studies, ordering and performing treatments and interventions, pulse oximetry, re-evaluation of patient's condition, review of old charts and obtaining history from patient or surrogate   Care discussed with: admitting provider      MEDICATIONS ORDERED IN ED: Medications  apixaban (ELIQUIS) tablet 5 mg (5 mg Oral Given 06/20/22 2229)  amLODipine (NORVASC) tablet 5 mg (5 mg Oral Given 06/20/22 2230)  levothyroxine (SYNTHROID) tablet 75 mcg (has no administration in time range)  sodium chloride flush (NS) 0.9 % injection 3 mL (3 mLs Intravenous Given 06/20/22 2230)  ceFEPIme (MAXIPIME) 2 g in sodium chloride 0.9 % 100 mL IVPB (has no administration in time range)  lactated ringers bolus 1,000 mL (0 mLs Intravenous Stopped 06/20/22 1845)     IMPRESSION / MDM / ASSESSMENT AND PLAN / ED COURSE  I reviewed the triage vital signs and the nursing notes.  82 year old male with PMH as noted above presents with generalized weakness over the last 1 to 2 days associated with fever.  EMS noted a foul urine smell.  On exam the vital signs are normal  except for hypertension, the patient is alert but somewhat weak appearing, and neurologic exam is nonfocal.  Differential diagnosis includes, but is not limited to, acute infection/sepsis, possible UTI, pneumonia, COVID or other viral syndrome.  We we will give additional fluids, empiric antibiotics for UTI, obtain lab workup, and reassess.  Patient's presentation is most consistent with acute presentation with potential threat to life or bodily function.  The  patient is on the cardiac monitor to evaluate for evidence of arrhythmia and/or significant heart rate changes.   ----------------------------------------- 6:56 PM on 06/20/2022 -----------------------------------------  Urinalysis is consistent with UTI.  CBC shows leukocytosis.  Electrolytes are unremarkable.  Vital signs remain stable.  I consulted Dr. Allena Katz from the hospitalist service; based on our discussion she agrees to evaluate the patient for admission.   FINAL CLINICAL IMPRESSION(S) / ED DIAGNOSES   Final diagnoses:  Urinary tract infection without hematuria, site unspecified     Rx / DC Orders   ED Discharge Orders     None        Note:  This document was prepared using Dragon voice recognition software and may include unintentional dictation errors.    Dionne Bucy, MD 06/20/22 [phone]

## 2022-06-20 NOTE — Consult Note (Signed)
Pharmacy Antibiotic Note  Kurt Hernandez is a 82 y.o. male admitted on 06/20/2022 with  possible UTI . Patient reported that he has had burning with urination and strong urine smell. He was also recently discharge from [facility] on 6/6 after being admitted with acute encephalopathy and right basal ganglia infarct. Per the admitting physician, there is concern for CLABSI/hospital-acquired UTI. Pharmacy has been consulted for Cefepime dosing.  Plan: Cefepime 2g Q24 hours per renal function  Will continue to monitor renal function, urine culture and adjust antibiotics as appropriate.  Height: 6\' 2"  (188 cm) Weight: 68.3 kg (150 lb 8 oz) IBW/kg (Calculated) : 82.2  Temp (24hrs), Avg:98.9 F (37.2 C), Min:98.7 F (37.1 C), Max:99.3 F (37.4 C)  Recent Labs  Lab 06/15/22 0706 06/16/22 0908 06/20/22 1702 06/20/22 1855  WBC 7.2  --  15.3*  --   CREATININE 1.78* 1.87* 1.95*  --   LATICACIDVEN  --   --  1.8 1.9    Estimated Creatinine Clearance: 29.2 mL/min (A) (by C-G formula based on SCr of 1.95 mg/dL (H)).    Allergies  Allergen Reactions   Mirtazapine Other (See Comments)    Other reaction(s): Dizziness-per wife, Patient has been tolerating 7.5 mg without dizziness.    Antimicrobials this admission: Ceftriaxone 6/10 x1 Cefepime 6/11 >>   Dose adjustments this admission: N/A  Microbiology results: 6/10 BCx: collected 6/10 UCx: collected   Thank you for allowing pharmacy to be a part of this patient's care.  Bettey Costa 06/20/2022 8:41 PM

## 2022-06-20 NOTE — ED Triage Notes (Signed)
Pt to ED via [facility] from home for possible UTI, pt was in the hospital last week after having a stroke. Pt reports that he has had burning with urination, pt has strong urine smell. Pt febrile with EMS at 102.7, tachycardic at 120, and tachypneic. EMS was called due to pt not getting out of bed today and not eating which is not like him. Pt is ambulatory at a cane at baseline.

## 2022-06-20 NOTE — Code Documentation (Signed)
CODE SEPSIS - PHARMACY COMMUNICATION  **Broad Spectrum Antibiotics should be administered within 1 hour of Sepsis diagnosis**  Time Code Sepsis Called/Page Received: 1628  Antibiotics Ordered: ceftriaxone   Time of 1st antibiotic administration: 1710  Additional action taken by pharmacy:   If necessary, Name of Provider/Nurse Contacted:     Sharen Hones ,PharmD Clinical Pharmacist  06/20/2022  5:13 PM

## 2022-06-20 NOTE — Hospital Course (Addendum)
HTN Anemia Elevated TNI Last A1c is 8.8 on  June 2024  Uti Sepsis.   2d Echo; 06/2022: 1. Left ventricular ejection fraction, by estimation, is 50%. The left  ventricle has low normal function. The left ventricle has no regional wall  motion abnormalities. There is mild concentric left ventricular  hypertrophy. Left ventricular diastolic  parameters are consistent with Grade I diastolic dysfunction (impaired  relaxation).   2. Right ventricular systolic function is normal. The right ventricular  size is normal.   3. The mitral valve is normal in structure. No evidence of mitral valve  regurgitation. No evidence of mitral stenosis.   4. The aortic valve is normal in structure. Aortic valve regurgitation is  not visualized. No aortic stenosis is present.   5. The inferior vena cava is normal in size with greater than 50%  respiratory variability, suggesting right atrial pressure of 3 mmHg.   UEGD negative. C-scopy was scheduled cancelled and is pending.

## 2022-06-21 ENCOUNTER — Encounter: Payer: Self-pay | Admitting: Internal Medicine

## 2022-06-21 DIAGNOSIS — R300 Dysuria: Secondary | ICD-10-CM | POA: Diagnosis not present

## 2022-06-21 LAB — CBG MONITORING, ED: Glucose-Capillary: 142 mg/dL — ABNORMAL HIGH (ref 70–99)

## 2022-06-21 LAB — PROCALCITONIN: Procalcitonin: 5.43 ng/mL

## 2022-06-21 LAB — CORTISOL-AM, BLOOD: Cortisol - AM: 23.7 ug/dL — ABNORMAL HIGH (ref 6.7–22.6)

## 2022-06-21 LAB — CULTURE, BLOOD (ROUTINE X 2)

## 2022-06-21 LAB — PROTIME-INR
INR: 1.8 — ABNORMAL HIGH (ref 0.8–1.2)
Prothrombin Time: 21.4 seconds — ABNORMAL HIGH (ref 11.4–15.2)

## 2022-06-21 LAB — PSA: Prostatic Specific Antigen: 7.69 ng/mL — ABNORMAL HIGH (ref 0.00–4.00)

## 2022-06-21 MED ORDER — INSULIN DETEMIR 100 UNIT/ML ~~LOC~~ SOLN
10.0000 [IU] | Freq: Every day | SUBCUTANEOUS | Status: DC
  Administered 2022-06-21 – 2022-06-23 (×3): 10 [IU] via SUBCUTANEOUS
  Filled 2022-06-21 (×3): qty 0.1

## 2022-06-21 MED ORDER — INSULIN ASPART 100 UNIT/ML IJ SOLN
0.0000 [IU] | Freq: Three times a day (TID) | INTRAMUSCULAR | Status: DC
  Administered 2022-06-22 – 2022-06-23 (×2): 3 [IU] via SUBCUTANEOUS
  Filled 2022-06-21 (×2): qty 1

## 2022-06-21 MED ORDER — ATORVASTATIN CALCIUM 20 MG PO TABS
40.0000 mg | ORAL_TABLET | Freq: Every day | ORAL | Status: DC
  Administered 2022-06-22: 40 mg via ORAL
  Filled 2022-06-21: qty 2

## 2022-06-21 MED ORDER — SODIUM CHLORIDE 0.9 % IV SOLN
1.0000 g | INTRAVENOUS | Status: DC
  Administered 2022-06-21 – 2022-06-22 (×2): 1 g via INTRAVENOUS
  Filled 2022-06-21 (×4): qty 10

## 2022-06-21 MED ORDER — HYDRALAZINE HCL 20 MG/ML IJ SOLN: 5.0000 mg | Freq: Four times a day (QID) | INTRAMUSCULAR | Status: DC | PRN

## 2022-06-21 NOTE — ED Notes (Signed)
Pt brief changed, cleaned of a small amount brown stool. Dry brief placed, pt positioned in bed for comfort.  Bed in low position, brakes locked, side rails up, call bell in reach.

## 2022-06-21 NOTE — ED Notes (Addendum)
Pts Spouse would like to be notified if the patient moves rooms while she is away  - [phone].

## 2022-06-21 NOTE — Assessment & Plan Note (Addendum)
EKG today shows A-fib with a heart rate of 83 no ST-T wave changes. Suspect secondary to AKI on CKD and or sepsis. Will avoid contrast renally dose needed medications.

## 2022-06-21 NOTE — Assessment & Plan Note (Addendum)
As above patient presenting with large leukocyte esterase and nitrite positive urine consistent with urinary tract infection patient also noted to have 21-50 RBCs and more than 50 WBCs with few bacteria.  Suspect this is a hospital-acquired infection as patient was recently discharged for an acute stroke. Patient initially was given Rocephin in the emergency room. We will change to cefepime. Will follow culture and sensitivity and tailor antibiotics further. Additional supportive measures with antipyretics.

## 2022-06-21 NOTE — Assessment & Plan Note (Signed)
Glycemic protocol, carb consistent cardiac diet.  A1c 6 days ago was 8.8.

## 2022-06-21 NOTE — ED Notes (Signed)
Lunch meal tray delivered at this time. 

## 2022-06-21 NOTE — Inpatient Diabetes Management (Signed)
Inpatient Diabetes Program Recommendations  AACE/ADA: New Consensus Statement on Inpatient Glycemic Control (2015)  Target Ranges:  Prepandial:   less than 140 mg/dL      Peak postprandial:   less than 180 mg/dL (1-2 hours)      Critically ill patients:  140 - 180 mg/dL   Lab Results  Component Value Date   GLUCAP 297 (H) 06/16/2022   HGBA1C 8.8 (H) 06/15/2022    Review of Glycemic Control  Latest Reference Range & Units 06/15/22 16:41 06/15/22 21:30 06/16/22 06:15 06/16/22 12:07  Glucose-Capillary 70 - 99 mg/dL 161 (H) 096 (H) 74 045 (H)  (H): Data is abnormally high Diabetes history: Type 2 DM Outpatient Diabetes medications: Levemir 5-20 units QD Current orders for Inpatient glycemic control: none  Inpatient Diabetes Program Recommendations:    Consider adding Semglee 8 units QD and Novolog 0-9 units TID & HS.   Thanks, Lujean Rave, MSN, RNC-OB Diabetes Coordinator [phone] (8a-5p)

## 2022-06-21 NOTE — Assessment & Plan Note (Signed)
Patient presenting with dysuria per niece at bedside patient had symptoms of dysuria even before he went to the hospital when he was diagnosed with a stroke. Urinalysis today shows. Urinalysis    Component Value Date/Time   COLORURINE YELLOW (A) 06/20/2022 1702   APPEARANCEUR CLOUDY (A) 06/20/2022 1702   APPEARANCEUR Clear 06/26/2019 1040   LABSPEC 1.011 06/20/2022 1702   PHURINE 6.0 06/20/2022 1702   GLUCOSEU 150 (A) 06/20/2022 1702   HGBUR LARGE (A) 06/20/2022 1702   BILIRUBINUR NEGATIVE 06/20/2022 1702   BILIRUBINUR Negative 06/26/2019 1040   KETONESUR 5 (A) 06/20/2022 1702   PROTEINUR 100 (A) 06/20/2022 1702   NITRITE POSITIVE (A) 06/20/2022 1702   LEUKOCYTESUR LARGE (A) 06/20/2022 1702  Consistent with urinary tract infection patient also meets sepsis criteria.

## 2022-06-21 NOTE — Assessment & Plan Note (Signed)
H/o Anemia and s/p EGD. LGI eval pending.     Latest Ref Rng & Units 06/20/2022    5:02 PM 06/15/2022    7:06 AM 06/13/2022    8:31 PM  CBC  WBC 4.0 - 10.5 K/uL 15.3  7.2  5.8   Hemoglobin 13.0 - 17.0 g/dL 16.1  09.6  04.5   Hematocrit 39.0 - 52.0 % 34.0  34.9  34.0   Platelets 150 - 400 K/uL 172  171  181   Currently stable.

## 2022-06-21 NOTE — Assessment & Plan Note (Signed)
Mild worsening.  Current serum creatinine of 1.95 Will avoid any contrast study.  Renally dose needed medications.

## 2022-06-21 NOTE — Assessment & Plan Note (Signed)
Continue with eliquis.

## 2022-06-21 NOTE — Assessment & Plan Note (Signed)
Continue levothyroxine at 75 mcg.  ?

## 2022-06-21 NOTE — Assessment & Plan Note (Signed)
Avoid contrast study.

## 2022-06-21 NOTE — Assessment & Plan Note (Signed)
Pt has PVC and mildly elevated TNI 2/2 to renal dysfunction, or sepsis.  No complaint of Chest pain or pressure or SOB.

## 2022-06-21 NOTE — ED Notes (Signed)
Pt soiled with urine at this time. This RN and Cesar, SN cleaned pt at this time. Bed sheet, bed pan, and brief changed. Pt is cleaned and dry at this time.

## 2022-06-21 NOTE — Progress Notes (Incomplete)
       CROSS COVER NOTE  NAME: Kurt INSKEEP MRN: [id number] DOB : [birth year]    Concern as stated by nurse / staff   Patient is having intermittent episodes of bradycardia, down into 30's. Episodes are brief, lasting 10 seconds or less but are frequent. Other vitals WDL, patient denies any complaints during these episodes.      Pertinent findings on chart review: Patient admitted on 6/10 for sepsis secondary to UTI.  Last progress note from 6/11 reviewed.  Med history reviewed.  Discharge summary from 06/16/2022 reviewed -Noted history of atrial fibrillation with slow ventricular rate noted on today's progress note - History of right basal ganglia stroke in June 2024 -Patient is not on rate control agents -Has not recently seen a cardiologist    06/21/2022    5:45 PM 06/21/2022    4:00 PM 06/21/2022    3:00 PM  Vitals with BMI  Systolic 127 131 409  Diastolic 79 51 58  Pulse  70 56     Physical exam Upon my arrival to the room patient resting comfortably.  Wife and daughter-in-law at bedside. Physical Exam Vitals and nursing note reviewed.  Constitutional:      General: He is not in acute distress.    Comments: Appears chronically deconditioned but in no distress  HENT:     Head: Normocephalic and atraumatic.  Cardiovascular:     Rate and Rhythm: Regular rhythm. Bradycardia present.     Heart sounds: Normal heart sounds.     Comments: Heart rate in the low 60s on the monitor Pulmonary:     Effort: Pulmonary effort is normal.     Breath sounds: Normal breath sounds.  Abdominal:     Palpations: Abdomen is soft.     Tenderness: There is no abdominal tenderness.  Neurological:     Mental Status: Mental status is at baseline.      Assessment and  Interventions   Assessment: A-fib with slow ventricular response, maintaining good blood pressures.  Patient mentating well  Plan: EKG ordered Continue to monitor closely Consult cardiology for additional  recommendations Discussed with wife at bedside that we will continue to monitor closely and get cardiology recommendations in the a.m. however if any decline during the night we will reach out to cardiology more urgently.   CRITICAL CARE Performed by: Andris Baumann   Total critical care time: 35 minutes  Critical care time was exclusive of separately billable procedures and treating other patients.  Critical care was necessary to treat or prevent imminent or life-threatening deterioration.  Critical care was time spent personally by me on the following activities: development of treatment plan with patient and/or surrogate as well as nursing, discussions with consultants, evaluation of patient's response to treatment, examination of patient, obtaining history from patient or surrogate, ordering and performing treatments and interventions, ordering and review of laboratory studies, ordering and review of radiographic studies, pulse oximetry and re-evaluation of patient's condition.

## 2022-06-21 NOTE — Progress Notes (Addendum)
Progress Note   Patient: Kurt Hernandez JXB:[id number] DOB: [birth year] DOA: 06/20/2022     1 DOS: the patient was seen and examined on 06/21/2022   Brief hospital course: TOSHIAKI TISBY is a 82 y.o. male with medical history significant for hypertension, recent CVA, anemia Presenting with complaints of dysuria.  Niece at bedside states that he had symptoms of dysuria prior to going to the hospital for His weakness and being found to have a stroke.  Home PT OT has not yet started patient was just discharged.  Patient is alert awake oriented afebrile. States he feels" like crap" patient still has left facial droop and some mild weakness but in general is mobile.  No other acute pain chest pain palpitations shortness of breath  chills hematuria nausea vomiting back pain flank pain.  Per ED report patient was noted to be febrile at 102.7 and was given a liter of normal saline prior to coming to the emergency room.  Code sepsis was activated as patient met possible sepsis criteria due to fever and vitals.  Blood and urine cultures collected in the emergency room in the emergency room initial glucose in urine was 254, CMP sodium 1 34-1.95 EGFR 31 normal LFTs otherwise. Serum creatinine is marginally worse than previous and patient has a history of stones as well.  Initial troponin of 27 and repeat 31.  CBC also shows leukocytosis of 15.3 hemoglobin 11.6 which is chronic and normal platelet count of 172.  Urinalysis is abnormal with large leukocytes positive nitrite urine no more than 50 WBCs and blood.  Respiratory panel negative for COVID RSV and influenza.  CT renal protocol showed enlarged prostate and to severe  cystitis. Pt given rocephin. EKG shows  a.fib / pvc lvh .  Assessment and Plan: Sepsis from a UTI As evidenced by fever with a Tmax 102.7, tachycardia, marked leukocytosis, pyuria. Patient has new onset incontinence per wife and complains of dysuria Imaging shows findings consistent with  moderate to marked severity cystitis. Moderate to marked severity prostatomegaly.  Bladder scan done to rule out urinary retention and patient noted to have postvoid residual of 30 cc Continue broad antibiotic therapy with Rocephin Follow-up results of urine culture Continue IV antibiotic therapy    History of recent CVA with right-sided hemiparesis Recently admitted at [facility] for right basal ganglia infarct with left-sided deficits Continue Eliquis and high intensity statin Patient was recently discharged with outpatient PT but has been unable to go to 1 session due to weakness Will request PT evaluation    History of atrial fibrillation Slow ventricular rate Continue Eliquis    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  Per wife he was discharged home almost a week ago but has been too weak to go for outpatient physical therapy.  Physical Exam: Vitals:   06/21/22 1000 06/21/22 1100 06/21/22 1300 06/21/22 1400  BP: 127/65 (!) 130/54 (!) 119/57 128/66  Pulse: (!) 51 (!) 56 (!) 59 (!) 35  Resp: 20 19 15  (!) 21  Temp:      TempSrc:      SpO2: 100% 99% 100% 99%  Weight:  Height:      Vitals and nursing note reviewed.  Constitutional:      General: He is not in acute distress. HENT:     Head: Normocephalic and atraumatic.     Right Ear: Hearing normal.     Left Ear: Hearing normal.     Nose: Nose normal. No nasal deformity.     Mouth/Throat:     Lips: Pink.     Tongue: No lesions.     Pharynx: Oropharynx is clear.  Eyes:     General: Lids are normal. No visual field deficit.    Extraocular Movements: Extraocular  movements intact.  Cardiovascular:     Rate and Rhythm: Normal rate. Rhythm irregular.     Heart sounds: Normal heart sounds.  Pulmonary:     Effort: Pulmonary effort is normal.     Breath sounds: Normal breath sounds.  Abdominal:     General: Bowel sounds are normal. There is no distension.     Palpations: Abdomen is soft. There is no mass.     Tenderness: There is no abdominal tenderness.  Musculoskeletal:     Right lower leg: No edema.     Left lower leg: No edema.  Skin:    General: Skin is warm.  Neurological:     General: No focal deficit present.     Mental Status: He is alert and oriented to person, place, and time.     Cranial Nerves: Facial asymmetry present. No cranial nerve deficit or dysarthria.     Motor: Weakness present.     Coordination: Finger-Nose-Finger Test normal.     Deep Tendon Reflexes:     Reflex Scores:      Bicep reflexes are 1+ on the right side and 1+ on the left side.      Patellar reflexes are 1+ on the right side and 1+ on the left side.    Comments: Left facial droop  Data Reviewed:  There are no new results to review at this time.  Family Communication: Greater than 50% of time was spent discussing patient's condition and plan of care with patient and his wife at the bedside.  All questions and concerns have been addressed.  They verbalized understanding and agreement with plan.  Disposition: Status is: Inpatient Remains inpatient appropriate because: IV antibiotic for sepsis from UTI  Planned Discharge Destination:  TBD    Time spent: 35 minutes  Author: Lucile Shutters, MD 06/21/2022 3:06 PM  For on call review [email or web address].

## 2022-06-21 NOTE — Assessment & Plan Note (Signed)
Patient has a history of kidney stones and stents in the past. With mild worsening of the creatinine to 1.95 and his sepsis UTI presentation we will obtain CT renal protocol to evaluate for any obstruction. Lab Results  Component Value Date   CREATININE 1.95 (H) 06/20/2022   CREATININE 1.87 (H) 06/16/2022   CREATININE 1.78 (H) 06/15/2022  CT renal stone study today is abnormal showing moderate to marked severe cystitis, moderate to marked prostatomegaly with correlation of PSA recommended, colonic diverticulosis, small hiatal hernia aortic atherosclerosis stable hepatic cyst otherwise.

## 2022-06-21 NOTE — Assessment & Plan Note (Signed)
EKG today shows A-fib with a heart rate of 83, PVCs, LVH otherwise normal axis and intervals. Cont with eliquis.

## 2022-06-21 NOTE — Assessment & Plan Note (Signed)
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  Patient had a history of GI bleed of unknown source and had been taking Eliquis at that time and was to undergo colonoscopy which has been delayed.  Currently patient is on Eliquis 5 mg twice daily.  Neurologically he has weakness and upper extremity but is nonfocal otherwise. No reports of swallowing difficulty or dysphagia no reports of incontinence or paresthesias.  No reports of bleeding.

## 2022-06-21 NOTE — Assessment & Plan Note (Signed)
Vitals:   06/20/22 1634 06/20/22 1800 06/20/22 2230 06/20/22 2300  BP: (!) 186/68 133/80 (!) 150/75 (!) 148/68   06/20/22 2330  BP: (!) 149/86  Patient is currently on amlodipine 5 mg. Will add as needed hydralazine 5 mg every 6 hours with systolic blood pressure goals of 140s.

## 2022-06-22 DIAGNOSIS — R300 Dysuria: Secondary | ICD-10-CM | POA: Diagnosis not present

## 2022-06-22 LAB — CBC
HCT: 31 % — ABNORMAL LOW (ref 39.0–52.0)
Hemoglobin: 10.5 g/dL — ABNORMAL LOW (ref 13.0–17.0)
MCH: 30.3 pg (ref 26.0–34.0)
MCHC: 33.9 g/dL (ref 30.0–36.0)
MCV: 89.3 fL (ref 80.0–100.0)
Platelets: 133 10*3/uL — ABNORMAL LOW (ref 150–400)
RBC: 3.47 MIL/uL — ABNORMAL LOW (ref 4.22–5.81)
RDW: 12.4 % (ref 11.5–15.5)
WBC: 12.2 10*3/uL — ABNORMAL HIGH (ref 4.0–10.5)
nRBC: 0 % (ref 0.0–0.2)

## 2022-06-22 LAB — BASIC METABOLIC PANEL
Anion gap: 9 (ref 5–15)
BUN: 32 mg/dL — ABNORMAL HIGH (ref 8–23)
CO2: 24 mmol/L (ref 22–32)
Calcium: 8.7 mg/dL — ABNORMAL LOW (ref 8.9–10.3)
Chloride: 104 mmol/L (ref 98–111)
Creatinine, Ser: 1.99 mg/dL — ABNORMAL HIGH (ref 0.61–1.24)
GFR, Estimated: 33 mL/min — ABNORMAL LOW (ref >60)
Glucose, Bld: 111 mg/dL — ABNORMAL HIGH (ref 70–99)
Potassium: 3.6 mmol/L (ref 3.5–5.1)
Sodium: 137 mmol/L (ref 135–145)

## 2022-06-22 LAB — CBG MONITORING, ED: Glucose-Capillary: 114 mg/dL — ABNORMAL HIGH (ref 70–99)

## 2022-06-22 LAB — GLUCOSE, CAPILLARY
Glucose-Capillary: 109 mg/dL — ABNORMAL HIGH (ref 70–99)
Glucose-Capillary: 161 mg/dL — ABNORMAL HIGH (ref 70–99)
Glucose-Capillary: 107 mg/dL — ABNORMAL HIGH (ref 70–99)

## 2022-06-22 LAB — CULTURE, BLOOD (ROUTINE X 2): Culture: NO GROWTH

## 2022-06-22 MED ORDER — LACTATED RINGERS IV SOLN: INTRAVENOUS | Status: DC

## 2022-06-22 NOTE — Progress Notes (Signed)
PROGRESS NOTE    Kurt Hernandez  WNU:[id number] DOB: 1940/06/25 DOA: 06/20/2022 PCP: Danella Penton, MD    Brief Narrative:   82 y.o. male with medical history significant for hypertension, recent CVA, anemia Presenting with complaints of dysuria.  Niece at bedside states that he had symptoms of dysuria prior to going to the hospital for His weakness and being found to have a stroke.  Home PT OT has not yet started patient was just discharged.  Patient is alert awake oriented afebrile.  Patient has signs and symptoms consistent with urinary tract infection with associated sepsis.   Assessment & Plan:   Principal Problem:   Dysuria Active Problems:   Sepsis secondary to UTI [facility])   Nephrolithiasis   Acute kidney injury superimposed on chronic kidney disease (HCC)   Acute cerebrovascular accident (CVA) (HCC)   Benign essential hypertension   History of atrial fibrillation   History of bilateral pulmonary embolism 2021   Type 2 diabetes mellitus with stage 3b chronic kidney disease, without long-term current use of insulin (HCC)   Elevated troponin   Acquired hypothyroidism   PVC's (premature ventricular contractions)   GI bleed  Sepsis secondary to UTI Acute metabolic encephalopathy secondary to above Dysuria Urinalysis is grossly infected Patient is hemodynamically stable Still feeling poorly Plan: IV Rocephin IV lactated Ringer's Monitor culture data Monitor vitals and fever curve  History of nephrolithiasis CT stone study negative for obstructive kidney stones  AKI on CKD stage IIIb Likely in the setting of urinary tract infection.  Hydrate and monitor  History of recent CVA No acute issues.  Okay for antiplatelet agents  History of atrial fibrillation Slow ventricular response PTA Eliquis  History of PE PTA Eliquis  Type 2 diabetes mellitus Recent A1c 8.8.  Moderately poor control.  Hold oral agents.  Carb modified diet. Scale  insulin  Hypothyroidism PTA Synthroid  DVT prophylaxis: Eliquis Code Status: Full Family Communication: Spouse at bedside 6/12 Disposition Plan: Status is: Inpatient Remains inpatient appropriate because: UTI sepsis   Level of care: Med-Surg  Consultants:  None  Procedures:  None  Antimicrobials: Ceftriaxone   Subjective: Seen and examined emergency room.  Still feeling weak.  Wife at bedside  Objective: Vitals:   06/22/22 0600 06/22/22 0900 06/22/22 1100 06/22/22 1200  BP: (!) 147/77 138/74 132/80 (!) 160/64  Pulse: (!) 44 (!) 51 (!) 58   Resp: (!) 21 17 20 16   Temp:  98.8 F (37.1 C) 98.6 F (37 C) 98.7 F (37.1 C)  TempSrc:  Oral Oral   SpO2: 98% 99% 98% 99%  Weight:      Height:       No intake or output data in the 24 hours ending 06/22/22 1623 Filed Weights   06/20/22 1635  Weight: 68.3 kg    Examination:  General exam: NAD.  Appears fatigued Respiratory system: Clear to auscultation. Respiratory effort normal. Cardiovascular system: S1-S2, regular rate, irregular rhythm, no murmurs, no pedal edema Gastrointestinal system: Thin, soft, NT/ND, normal bowel sounds Central nervous system: Alert and oriented. No focal neurological deficits. Extremities: Symmetrical decreased power bilaterally.  Gait not assessed Skin: No rashes, lesions or ulcers Psychiatry: Judgement and insight appear normal. Mood & affect appropriate.     Data Reviewed: I have personally reviewed following labs and imaging studies  CBC: Recent Labs  Lab 06/20/22 1702 06/22/22 0628  WBC 15.3* 12.2*  NEUTROABS 13.9*  --   HGB 11.6* 10.5*  HCT 34.0* 31.0*  MCV  89.0 89.3  PLT 172 133*   Basic Metabolic Panel: Recent Labs  Lab 06/16/22 0908 06/20/22 1702 06/22/22 0628  NA 134* 134* 137  K 3.5 3.6 3.6  CL 104 103 104  CO2 24 21* 24  GLUCOSE 129* 254* 111*  BUN 26* 25* 32*  CREATININE 1.87* 1.95* 1.99*  CALCIUM 9.4 9.1 8.7*  PHOS 3.3  --   --    GFR: Estimated  Creatinine Clearance: 28.6 mL/min (A) (by C-G formula based on SCr of 1.99 mg/dL (H)). Liver Function Tests: Recent Labs  Lab 06/16/22 0908 06/20/22 1702  AST  --  26  ALT  --  12  ALKPHOS  --  69  BILITOT  --  1.1  PROT  --  6.2*  ALBUMIN 3.1* 3.4*   No results for input(s): "LIPASE", "AMYLASE" in the last 168 hours. No results for input(s): "AMMONIA" in the last 168 hours. Coagulation Profile: Recent Labs  Lab 06/21/22 0424  INR 1.8*   Cardiac Enzymes: No results for input(s): "CKTOTAL", "CKMB", "CKMBINDEX", "TROPONINI" in the last 168 hours. BNP (last 3 results) No results for input(s): "PROBNP" in the last 8760 hours. HbA1C: No results for input(s): "HGBA1C" in the last 72 hours. CBG: Recent Labs  Lab 06/16/22 0615 06/16/22 1207 06/21/22 2134 06/22/22 0839 06/22/22 1158  GLUCAP 74 297* 142* 114* 109*   Lipid Profile: No results for input(s): "CHOL", "HDL", "LDLCALC", "TRIG", "CHOLHDL", "LDLDIRECT" in the last 72 hours. Thyroid Function Tests: No results for input(s): "TSH", "T4TOTAL", "FREET4", "T3FREE", "THYROIDAB" in the last 72 hours. Anemia Panel: No results for input(s): "VITAMINB12", "FOLATE", "FERRITIN", "TIBC", "IRON", "RETICCTPCT" in the last 72 hours. Sepsis Labs: Recent Labs  Lab 06/20/22 1702 06/20/22 1855 06/21/22 0424  PROCALCITON  --   --  5.43  LATICACIDVEN 1.8 1.9  --     Recent Results (from the past 240 hour(s))  Resp panel by RT-PCR (RSV, Flu A&B, Covid) Anterior Nasal Swab     Status: None   Collection Time: 06/20/22  5:02 PM   Specimen: Anterior Nasal Swab  Result Value Ref Range Status   SARS Coronavirus 2 by RT PCR NEGATIVE NEGATIVE Final    Comment: (NOTE) SARS-CoV-2 target nucleic acids are NOT DETECTED.  The SARS-CoV-2 RNA is generally detectable in upper respiratory specimens during the acute phase of infection. The lowest concentration of SARS-CoV-2 viral copies this assay can detect is 138 copies/mL. A negative result  does not preclude SARS-Cov-2 infection and should not be used as the sole basis for treatment or other patient management decisions. A negative result may occur with  improper specimen collection/handling, submission of specimen other than nasopharyngeal swab, presence of viral mutation(s) within the areas targeted by this assay, and inadequate number of viral copies(<138 copies/mL). A negative result must be combined with clinical observations, patient history, and epidemiological information. The expected result is Negative.  Fact Sheet for Patients:  BloggerCourse.com  Fact Sheet for Healthcare Providers:  SeriousBroker.it  This test is no t yet approved or cleared by the [place] FDA and  has been authorized for detection and/or diagnosis of SARS-CoV-2 by FDA under an Emergency Use Authorization (EUA). This EUA will remain  in effect (meaning this test can be used) for the duration of the COVID-19 declaration under Section 564(b)(1) of the Act, 21 U.S.C.section 360bbb-3(b)(1), unless the authorization is terminated  or revoked sooner.       Influenza A by PCR NEGATIVE NEGATIVE Final   Influenza B by  PCR NEGATIVE NEGATIVE Final    Comment: (NOTE) The Xpert Xpress SARS-CoV-2/FLU/RSV plus assay is intended as an aid in the diagnosis of influenza from Nasopharyngeal swab specimens and should not be used as a sole basis for treatment. Nasal washings and aspirates are unacceptable for Xpert Xpress SARS-CoV-2/FLU/RSV testing.  Fact Sheet for Patients: BloggerCourse.com  Fact Sheet for Healthcare Providers: SeriousBroker.it  This test is not yet approved or cleared by the [place] FDA and has been authorized for detection and/or diagnosis of SARS-CoV-2 by FDA under an Emergency Use Authorization (EUA). This EUA will remain in effect (meaning this test can be used) for  the duration of the COVID-19 declaration under Section 564(b)(1) of the Act, 21 U.S.C. section 360bbb-3(b)(1), unless the authorization is terminated or revoked.     Resp Syncytial Virus by PCR NEGATIVE NEGATIVE Final    Comment: (NOTE) Fact Sheet for Patients: BloggerCourse.com  Fact Sheet for Healthcare Providers: SeriousBroker.it  This test is not yet approved or cleared by the [place] FDA and has been authorized for detection and/or diagnosis of SARS-CoV-2 by FDA under an Emergency Use Authorization (EUA). This EUA will remain in effect (meaning this test can be used) for the duration of the COVID-19 declaration under Section 564(b)(1) of the Act, 21 U.S.C. section 360bbb-3(b)(1), unless the authorization is terminated or revoked.  Performed at [facility], [address] [place], [place] [address]   Blood Culture (routine x 2)     Status: None (Preliminary result)   Collection Time: 06/20/22  5:02 PM   Specimen: BLOOD  Result Value Ref Range Status   Specimen Description BLOOD BLOOD RIGHT ARM  Final   Special Requests   Final    BOTTLES DRAWN AEROBIC AND ANAEROBIC Blood Culture results may not be optimal due to an inadequate volume of blood received in culture bottles   Culture   Final    NO GROWTH 2 DAYS Performed at [facility], [address] [place], [place] [address]    Report Status PENDING  Incomplete  Urine Culture     Status: Abnormal (Preliminary result)   Collection Time: 06/20/22  5:02 PM   Specimen: Urine, Random  Result Value Ref Range Status   Specimen Description   Final    URINE, RANDOM Performed at [facility], [address] [place], [place] [address]    Special Requests   Final    NONE Reflexed from [id number] Performed at [facility], [address] [place], [place] [address]    Culture (A)  Final    20,000 COLONIES/mL ESCHERICHIA  COLI SUSCEPTIBILITIES TO FOLLOW Performed at [facility], [address] [place], [place] [address]    Report Status PENDING  Incomplete  Blood Culture (routine x 2)     Status: None (Preliminary result)   Collection Time: 06/20/22  5:04 PM   Specimen: BLOOD  Result Value Ref Range Status   Specimen Description BLOOD BLOOD LEFT ARM  Final   Special Requests   Final    BOTTLES DRAWN AEROBIC AND ANAEROBIC Blood Culture adequate volume   Culture   Final    NO GROWTH 2 DAYS Performed at [facility], [address], [place], [place] [address]    Report Status PENDING  Incomplete         Radiology Studies: CT RENAL STONE STUDY  Result Date: 06/20/2022 CLINICAL DATA:  Burning with urination. EXAM: CT ABDOMEN AND PELVIS WITHOUT CONTRAST TECHNIQUE: Multidetector CT  imaging of the abdomen and pelvis was performed following the standard protocol without IV contrast. RADIATION DOSE REDUCTION: This exam was performed according to the departmental dose-optimization program which includes automated exposure control, adjustment of the mA and/or kV according to patient size and/or use of iterative reconstruction technique. COMPARISON:  September 14, 2021 FINDINGS: Lower chest: Mild atelectasis is seen within the posterior aspects of the bilateral lung bases. Hepatobiliary: A stable 16 mm x 12 mm hepatic cyst is seen. No gallstones, gallbladder wall thickening, or biliary dilatation. Pancreas: Unremarkable. No pancreatic ductal dilatation or surrounding inflammatory changes. Spleen: Normal in size without focal abnormality. Adrenals/Urinary Tract: Adrenal glands are unremarkable. Kidneys are normal, without renal calculi, focal lesion, or hydronephrosis. The urinary bladder is poorly distended and subsequently limited in evaluation. Moderate to marked severity diffuse urinary bladder wall thickening is seen with moderate severity surrounding inflammatory fat stranding. Stomach/Bowel: There  is a small hiatal hernia. Appendix appears normal. No evidence of bowel wall thickening, distention, or inflammatory changes. Noninflamed diverticula are seen throughout the large bowel. Vascular/Lymphatic: Aortic atherosclerosis. No enlarged abdominal or pelvic lymph nodes. Reproductive: There is moderate to marked severity prostate gland enlargement. Other: Multiple surgical coils are seen within the bilateral inguinal regions. No abdominopelvic ascites. Musculoskeletal: Multilevel degenerative changes are noted throughout the lumbar spine. IMPRESSION: 1. Findings consistent with moderate to marked severity cystitis. Correlation with urinalysis is recommended. 2. Colonic diverticulosis. 3. Small hiatal hernia. 4. Stable hepatic cyst.  No follow-up imaging is recommended. 5. Moderate to marked severity prostatomegaly. Correlation with PSA levels is recommended. 6. Aortic atherosclerosis. Aortic Atherosclerosis (ICD10-I70.0). Electronically Signed   By: Aram Candela M.D.   On: 06/20/2022 20:02   DG Chest Port 1 View  Result Date: 06/20/2022 CLINICAL DATA:  Possible UTI EXAM: PORTABLE CHEST 1 VIEW COMPARISON:  09/14/2021 FINDINGS: Cardiac and mediastinal contours are within normal limits. No new focal pulmonary opacity. No pleural effusion or pneumothorax. Redemonstrated hyperinflation. No acute osseous abnormality. IMPRESSION: No acute cardiopulmonary process. Electronically Signed   By: Wiliam Ke M.D.   On: 06/20/2022 18:18        Scheduled Meds:  amLODipine  5 mg Oral QHS   apixaban  5 mg Oral BID   atorvastatin  40 mg Oral Daily   insulin aspart  0-15 Units Subcutaneous TID WC   insulin detemir  10 Units Subcutaneous Daily   levothyroxine  75 mcg Oral Q0600   sodium chloride flush  3 mL Intravenous Q12H   Continuous Infusions:  cefTRIAXone (ROCEPHIN)  IV 1 g (06/22/22 1429)   lactated ringers 100 mL/hr at 06/22/22 1055     LOS: 2 days      Tresa Moore, MD [facility]   If 7PM-7AM, please contact night-coverage  06/22/2022, 4:23 PM

## 2022-06-23 DIAGNOSIS — R300 Dysuria: Secondary | ICD-10-CM | POA: Diagnosis not present

## 2022-06-23 LAB — CBC WITH DIFFERENTIAL/PLATELET
Abs Immature Granulocytes: 0.05 10*3/uL (ref 0.00–0.07)
Basophils Absolute: 0 10*3/uL (ref 0.0–0.1)
Basophils Relative: 0 %
Eosinophils Absolute: 0.1 10*3/uL (ref 0.0–0.5)
Eosinophils Relative: 2 %
HCT: 30.5 % — ABNORMAL LOW (ref 39.0–52.0)
Hemoglobin: 10.5 g/dL — ABNORMAL LOW (ref 13.0–17.0)
Immature Granulocytes: 1 %
Lymphocytes Relative: 15 %
Lymphs Abs: 1.3 10*3/uL (ref 0.7–4.0)
MCH: 30.2 pg (ref 26.0–34.0)
MCHC: 34.4 g/dL (ref 30.0–36.0)
MCV: 87.6 fL (ref 80.0–100.0)
Monocytes Absolute: 0.7 10*3/uL (ref 0.1–1.0)
Monocytes Relative: 9 %
Neutro Abs: 6.2 10*3/uL (ref 1.7–7.7)
Neutrophils Relative %: 73 %
Platelets: 147 10*3/uL — ABNORMAL LOW (ref 150–400)
RBC: 3.48 MIL/uL — ABNORMAL LOW (ref 4.22–5.81)
RDW: 12.2 % (ref 11.5–15.5)
WBC: 8.4 10*3/uL (ref 4.0–10.5)
nRBC: 0 % (ref 0.0–0.2)

## 2022-06-23 LAB — URINE CULTURE: Culture: 20000 — AB

## 2022-06-23 LAB — BASIC METABOLIC PANEL
Anion gap: 7 (ref 5–15)
BUN: 29 mg/dL — ABNORMAL HIGH (ref 8–23)
CO2: 24 mmol/L (ref 22–32)
Calcium: 9.2 mg/dL (ref 8.9–10.3)
Chloride: 103 mmol/L (ref 98–111)
Creatinine, Ser: 1.82 mg/dL — ABNORMAL HIGH (ref 0.61–1.24)
GFR, Estimated: 37 mL/min — ABNORMAL LOW (ref >60)
Glucose, Bld: 124 mg/dL — ABNORMAL HIGH (ref 70–99)
Potassium: 3.8 mmol/L (ref 3.5–5.1)
Sodium: 134 mmol/L — ABNORMAL LOW (ref 135–145)

## 2022-06-23 LAB — GLUCOSE, CAPILLARY: Glucose-Capillary: 165 mg/dL — ABNORMAL HIGH (ref 70–99)

## 2022-06-23 MED ORDER — CIPROFLOXACIN HCL 500 MG PO TABS: 500.0000 mg | ORAL_TABLET | Freq: Two times a day (BID) | ORAL | 0 refills | Status: AC

## 2022-06-23 MED ORDER — ATORVASTATIN CALCIUM 40 MG PO TABS: 40.0000 mg | ORAL_TABLET | Freq: Every day | ORAL | 0 refills | Status: AC

## 2022-06-23 MED ORDER — POTASSIUM CHLORIDE 20 MEQ PO PACK
40.0000 meq | PACK | Freq: Once | ORAL | Status: AC
  Administered 2022-06-23: 40 meq via ORAL
  Filled 2022-06-23: qty 2

## 2022-06-23 MED ORDER — CIPROFLOXACIN HCL 500 MG PO TABS: 500.0000 mg | ORAL_TABLET | Freq: Two times a day (BID) | ORAL | Status: DC

## 2022-06-23 NOTE — Discharge Summary (Signed)
Physician Discharge Summary  HERVIN DUCH WUJ:[id number] DOB: 02/21/1940 DOA: 06/20/2022  PCP: Danella Penton, MD  Admit date: 06/20/2022 Discharge date: 06/23/2022  Admitted From: Home Disposition:  Home  Recommendations for Outpatient Follow-up:  Follow up with PCP in 1-2 weeks   Home Health:Yes OT PT Equipment/Devices:None   Discharge Condition:Stable  CODE STATUS:FULL  Diet recommendation: Carb  Brief/Interim Summary:  82 y.o. male with medical history significant for hypertension, recent CVA, anemia Presenting with complaints of dysuria.  Niece at bedside states that he had symptoms of dysuria prior to going to the hospital for His weakness and being found to have a stroke.  Home PT OT has not yet started patient was just discharged.  Patient is alert awake oriented afebrile.   Patient has signs and symptoms consistent with urinary tract infection with associated sepsis.      Discharge Diagnoses:  Principal Problem:   Dysuria Active Problems:   Sepsis secondary to UTI [facility])   Nephrolithiasis   Acute kidney injury superimposed on chronic kidney disease (HCC)   Acute cerebrovascular accident (CVA) (HCC)   Benign essential hypertension   History of atrial fibrillation   History of bilateral pulmonary embolism 2021   Type 2 diabetes mellitus with stage 3b chronic kidney disease, without long-term current use of insulin (HCC)   Elevated troponin   Acquired hypothyroidism   PVC's (premature ventricular contractions)   GI bleed  Sepsis secondary to UTI Acute metabolic encephalopathy secondary to above Dysuria Urinalysis is grossly infected Patient is hemodynamically stable Pansensitive E. Coli Transition to p.o. ciprofloxacin per sensitivities Stable for discharge home to complete antibiotic course  History of nephrolithiasis CT stone study negative for obstructive kidney stones   AKI on CKD stage IIIb Likely in the setting of urinary tract infection.   Hydrated in house.  Creatinine approaching baseline at time of discharge   History of recent CVA No acute issues.  Okay for antiplatelet agents   History of atrial fibrillation Slow ventricular response PTA Eliquis  Discharge Instructions  Discharge Instructions     Diet - low sodium heart healthy   Complete by: As directed    Increase activity slowly   Complete by: As directed       Allergies as of 06/23/2022       Reactions   Mirtazapine Other (See Comments)   Other reaction(s): Dizziness-per wife, Patient has been tolerating 7.5 mg without dizziness.        Medication List     TAKE these medications    amLODipine 5 MG tablet Commonly known as: NORVASC Take 1 tablet by mouth at bedtime.   atorvastatin 40 MG tablet Commonly known as: LIPITOR Take 1 tablet (40 mg total) by mouth daily.   ciprofloxacin 500 MG tablet Commonly known as: CIPRO Take 1 tablet (500 mg total) by mouth 2 (two) times daily for 6 days.   Eliquis 5 MG Tabs tablet Generic drug: apixaban Take 1 tablet (5 mg total) by mouth 2 (two) times daily.   Levemir 100 UNIT/ML injection Generic drug: insulin detemir Inject 5-20 Units into the skin every morning.   levothyroxine 75 MCG tablet Commonly known as: SYNTHROID Take 1 tablet (75 mcg total) by mouth daily at 6 (six) AM.   omeprazole 40 MG capsule Commonly known as: PRILOSEC Take 40 mg by mouth 2 (two) times daily before a meal.        Allergies  Allergen Reactions   Mirtazapine Other (See Comments)  Other reaction(s): Dizziness-per wife, Patient has been tolerating 7.5 mg without dizziness.    Consultations: None   Procedures/Studies: CT RENAL STONE STUDY  Result Date: 06/20/2022 CLINICAL DATA:  Burning with urination. EXAM: CT ABDOMEN AND PELVIS WITHOUT CONTRAST TECHNIQUE: Multidetector CT imaging of the abdomen and pelvis was performed following the standard protocol without IV contrast. RADIATION DOSE REDUCTION: This  exam was performed according to the departmental dose-optimization program which includes automated exposure control, adjustment of the mA and/or kV according to patient size and/or use of iterative reconstruction technique. COMPARISON:  September 14, 2021 FINDINGS: Lower chest: Mild atelectasis is seen within the posterior aspects of the bilateral lung bases. Hepatobiliary: A stable 16 mm x 12 mm hepatic cyst is seen. No gallstones, gallbladder wall thickening, or biliary dilatation. Pancreas: Unremarkable. No pancreatic ductal dilatation or surrounding inflammatory changes. Spleen: Normal in size without focal abnormality. Adrenals/Urinary Tract: Adrenal glands are unremarkable. Kidneys are normal, without renal calculi, focal lesion, or hydronephrosis. The urinary bladder is poorly distended and subsequently limited in evaluation. Moderate to marked severity diffuse urinary bladder wall thickening is seen with moderate severity surrounding inflammatory fat stranding. Stomach/Bowel: There is a small hiatal hernia. Appendix appears normal. No evidence of bowel wall thickening, distention, or inflammatory changes. Noninflamed diverticula are seen throughout the large bowel. Vascular/Lymphatic: Aortic atherosclerosis. No enlarged abdominal or pelvic lymph nodes. Reproductive: There is moderate to marked severity prostate gland enlargement. Other: Multiple surgical coils are seen within the bilateral inguinal regions. No abdominopelvic ascites. Musculoskeletal: Multilevel degenerative changes are noted throughout the lumbar spine. IMPRESSION: 1. Findings consistent with moderate to marked severity cystitis. Correlation with urinalysis is recommended. 2. Colonic diverticulosis. 3. Small hiatal hernia. 4. Stable hepatic cyst.  No follow-up imaging is recommended. 5. Moderate to marked severity prostatomegaly. Correlation with PSA levels is recommended. 6. Aortic atherosclerosis. Aortic Atherosclerosis (ICD10-I70.0).  Electronically Signed   By: Aram Candela M.D.   On: 06/20/2022 20:02   DG Chest Port 1 View  Result Date: 06/20/2022 CLINICAL DATA:  Possible UTI EXAM: PORTABLE CHEST 1 VIEW COMPARISON:  09/14/2021 FINDINGS: Cardiac and mediastinal contours are within normal limits. No new focal pulmonary opacity. No pleural effusion or pneumothorax. Redemonstrated hyperinflation. No acute osseous abnormality. IMPRESSION: No acute cardiopulmonary process. Electronically Signed   By: Wiliam Ke M.D.   On: 06/20/2022 18:18   VAS US CAROTID  Result Date: 06/14/2022 Carotid Arterial Duplex Study Patient Name:  ELRICO GERRICK  Date of Exam:   06/14/2022 Medical Rec #: [id number]          Accession #:    [id number] Date of Birth: December 01, 1940         Patient Gender: M Patient Age:   51 years Exam Location:  [facility] Procedure:      VAS US CAROTID Referring Phys: Terrilee Files [facility] --------------------------------------------------------------------------------  Indications:       CVA. Risk Factors:      Hypertension, hyperlipidemia, Diabetes. Comparison Study:  11/17/2021 - Right Carotid: The extracranial vessels were                    near-normal with only minimal wall thickening or plaque.                     Left Carotid: The extracranial vessels were near-normal with                    only minimal wall thickening or plaque.  Vertebrals: Bilateral vertebral arteries demonstrate                    antegrade flow. Performing Technologist: Chanda Busing RVT  Examination Guidelines: A complete evaluation includes B-mode imaging, spectral Doppler, color Doppler, and power Doppler as needed of all accessible portions of each vessel. Bilateral testing is considered an integral part of a complete examination. Limited examinations for reoccurring indications may be performed as noted.  Right Carotid Findings: +----------+--------+--------+--------+-----------------------+--------+           PSV  cm/sEDV cm/sStenosisPlaque Description     Comments +----------+--------+--------+--------+-----------------------+--------+ CCA Prox  135     16              smooth and heterogenoustortuous +----------+--------+--------+--------+-----------------------+--------+ CCA Distal70      11              smooth and heterogenous         +----------+--------+--------+--------+-----------------------+--------+ ICA Prox  47      10              smooth and heterogenous         +----------+--------+--------+--------+-----------------------+--------+ ICA Mid   64      18              smooth and heterogenous         +----------+--------+--------+--------+-----------------------+--------+ ICA Distal52      11                                     tortuous +----------+--------+--------+--------+-----------------------+--------+ ECA       59      10                                              +----------+--------+--------+--------+-----------------------+--------+ +----------+--------+-------+--------+-------------------+           PSV cm/sEDV cmsDescribeArm Pressure (mmHG) +----------+--------+-------+--------+-------------------+ [id number]                                        +----------+--------+-------+--------+-------------------+ +---------+--------+--+--------+--+ VertebralPSV cm/s57EDV cm/s12 +---------+--------+--+--------+--+  Left Carotid Findings: +----------+--------+--------+--------+-----------------------+--------+           PSV cm/sEDV cm/sStenosisPlaque Description     Comments +----------+--------+--------+--------+-----------------------+--------+ CCA Prox  70      14              smooth and heterogenoustortuous +----------+--------+--------+--------+-----------------------+--------+ CCA Distal52      11              smooth and heterogenous         +----------+--------+--------+--------+-----------------------+--------+ ICA  Prox  49      12              smooth and heterogenous         +----------+--------+--------+--------+-----------------------+--------+ ICA Mid   65      22              smooth and heterogenoustortuous +----------+--------+--------+--------+-----------------------+--------+ ICA Distal60      16                                     tortuous +----------+--------+--------+--------+-----------------------+--------+ ECA  52      9                                               +----------+--------+--------+--------+-----------------------+--------+ +----------+--------+--------+--------+-------------------+           PSV cm/sEDV cm/sDescribeArm Pressure (mmHG) +----------+--------+--------+--------+-------------------+ [id number]                                         +----------+--------+--------+--------+-------------------+ +---------+--------+--+--------+-+---------+ VertebralPSV cm/s39EDV cm/s8Antegrade +---------+--------+--+--------+-+---------+   Summary: Right Carotid: Velocities in the right ICA are consistent with a 1-39% stenosis. Left Carotid: Velocities in the left ICA are consistent with a 1-39% stenosis. Vertebrals: Bilateral vertebral arteries demonstrate antegrade flow. *See table(s) above for measurements and observations.  Electronically signed by Waverly Ferrari MD on 06/14/2022 at 4:23:25 PM.    Final    ECHOCARDIOGRAM COMPLETE  Result Date: 06/14/2022    ECHOCARDIOGRAM REPORT   Patient Name:   AMARRION DONMOYER Date of Exam: 06/14/2022 Medical Rec #:  [id number]         Height:       74.0 in Accession #:    [id number]        Weight:       160.0 lb Date of Birth:  Dec 17, 1940        BSA:          1.977 m Patient Age:    81 years          BP:           198/88 mmHg Patient Gender: M                 HR:           73 bpm. Exam Location:  Inpatient Procedure: 2D Echo, Cardiac Doppler and Color Doppler Indications:    cva  History:        Patient has  no prior history of Echocardiogram examinations.                 Stroke; Risk Factors:Hypertension, Diabetes and Dyslipidemia.  Sonographer:    Melissa Morford RDCS (AE, PE) Referring Phys: [phone] JULIE HAVILAND IMPRESSIONS  1. Left ventricular ejection fraction, by estimation, is 50%. The left ventricle has low normal function. The left ventricle has no regional wall motion abnormalities. There is mild concentric left ventricular hypertrophy. Left ventricular diastolic parameters are consistent with Grade I diastolic dysfunction (impaired relaxation).  2. Right ventricular systolic function is normal. The right ventricular size is normal.  3. The mitral valve is normal in structure. No evidence of mitral valve regurgitation. No evidence of mitral stenosis.  4. The aortic valve is normal in structure. Aortic valve regurgitation is not visualized. No aortic stenosis is present.  5. The inferior vena cava is normal in size with greater than 50% respiratory variability, suggesting right atrial pressure of 3 mmHg. FINDINGS  Left Ventricle: Left ventricular ejection fraction, by estimation, is 50 to 55%. The left ventricle has low normal function. The left ventricle has no regional wall motion abnormalities. The left ventricular internal cavity size was normal in size. There is mild concentric left ventricular hypertrophy. Left ventricular diastolic parameters are consistent with Grade I diastolic dysfunction (impaired relaxation). Right Ventricle: The right ventricular size is normal. No increase in  right ventricular wall thickness. Right ventricular systolic function is normal. Left Atrium: Left atrial size was normal in size. Right Atrium: Right atrial size was normal in size. Pericardium: There is no evidence of pericardial effusion. Mitral Valve: The mitral valve is normal in structure. No evidence of mitral valve regurgitation. No evidence of mitral valve stenosis. Tricuspid Valve: The tricuspid valve is normal in  structure. Tricuspid valve regurgitation is not demonstrated. No evidence of tricuspid stenosis. Aortic Valve: The aortic valve is normal in structure. Aortic valve regurgitation is not visualized. No aortic stenosis is present. Pulmonic Valve: The pulmonic valve was not well visualized. Pulmonic valve regurgitation is not visualized. No evidence of pulmonic stenosis. Aorta: The aortic root is normal in size and structure. Venous: The inferior vena cava is normal in size with greater than 50% respiratory variability, suggesting right atrial pressure of 3 mmHg. IAS/Shunts: No atrial level shunt detected by color flow Doppler.  LEFT VENTRICLE PLAX 2D LVIDd:         3.70 cm   Diastology LVIDs:         3.10 cm   LV e' medial:    6.09 cm/s LV PW:         1.10 cm   LV E/e' medial:  6.4 LV IVS:        1.10 cm   LV e' lateral:   9.14 cm/s LVOT diam:     2.20 cm   LV E/e' lateral: 4.2 LV SV:         61 LV SV Index:   31 LVOT Area:     3.80 cm  RIGHT VENTRICLE RV S prime:     15.90 cm/s TAPSE (M-mode): 3.0 cm LEFT ATRIUM           Index        RIGHT ATRIUM           Index LA diam:      3.50 cm 1.77 cm/m   RA Area:     20.10 cm LA Vol (A2C): 61.4 ml 31.06 ml/m  RA Volume:   60.60 ml  30.66 ml/m LA Vol (A4C): 58.1 ml 29.39 ml/m  AORTIC VALVE LVOT Vmax:   72.40 cm/s LVOT Vmean:  53.200 cm/s LVOT VTI:    0.161 m  AORTA Ao Root diam: 3.90 cm Ao Asc diam:  3.90 cm MITRAL VALVE MV Area (PHT): 2.15 cm    SHUNTS MV Decel Time: 353 msec    Systemic VTI:  0.16 m MV E velocity: 38.80 cm/s  Systemic Diam: 2.20 cm MV A velocity: 71.30 cm/s MV E/A ratio:  0.54 Aditya Sabharwal Electronically signed by Dorthula Nettles Signature Date/Time: 06/14/2022/12:25:46 PM    Final    MR BRAIN WO CONTRAST  Result Date: 06/14/2022 CLINICAL DATA:  Neuro deficit, acute, stroke suspected. EXAM: MRI HEAD WITHOUT CONTRAST MRA HEAD WITHOUT CONTRAST TECHNIQUE: Multiplanar, multi-echo pulse sequences of the brain and surrounding structures were acquired  without intravenous contrast. Angiographic images of the Circle of Willis were acquired using MRA technique without intravenous contrast. COMPARISON:  Head CT 06/13/2022. FINDINGS: MRI HEAD FINDINGS Brain: Acute infarct in the right basal ganglia. No acute hemorrhage or significant mass effect. Underlying moderate chronic small-vessel disease with scattered old lacunar infarcts in the bilateral basal ganglia, thalami, pons and cerebral white matter. Sequela of prior hemorrhage in the right caudate. Few, scattered chronic microhemorrhages. No hydrocephalus or extra-axial collection. Vascular: Normal flow voids. Skull and upper cervical spine: Normal marrow signal. Joint  effusions in the right-greater-than-left occipital condylar-C1 joints. Sinuses/Orbits: Unremarkable. Other: None. MRA HEAD FINDINGS Anterior circulation: Intracranial ICAs are patent without stenosis or aneurysm. Mild stenosis of the right MCA, M1 segment. The left MCA and bilateral ACAs are patent proximally without stenosis or aneurysm. Distal branches are symmetric. Posterior circulation: Visualized portions of the distal vertebral arteries and basilar artery are patent without stenosis or aneurysm. The SCAs, AICAs and PICAs are patent proximally. The PCAs are patent proximally without stenosis or aneurysm. Distal branches are symmetric. Anatomic variants: Hypoplastic A1 segment of the right ACA. IMPRESSION: 1. Acute infarct in the right basal ganglia. No acute hemorrhage or significant mass effect. 2. Mild stenosis of the right MCA, M1 segment. Electronically Signed   By: Orvan Falconer M.D.   On: 06/14/2022 08:32   MR ANGIO HEAD WO CONTRAST  Result Date: 06/14/2022 CLINICAL DATA:  Neuro deficit, acute, stroke suspected. EXAM: MRI HEAD WITHOUT CONTRAST MRA HEAD WITHOUT CONTRAST TECHNIQUE: Multiplanar, multi-echo pulse sequences of the brain and surrounding structures were acquired without intravenous contrast. Angiographic images of the Circle  of Willis were acquired using MRA technique without intravenous contrast. COMPARISON:  Head CT 06/13/2022. FINDINGS: MRI HEAD FINDINGS Brain: Acute infarct in the right basal ganglia. No acute hemorrhage or significant mass effect. Underlying moderate chronic small-vessel disease with scattered old lacunar infarcts in the bilateral basal ganglia, thalami, pons and cerebral white matter. Sequela of prior hemorrhage in the right caudate. Few, scattered chronic microhemorrhages. No hydrocephalus or extra-axial collection. Vascular: Normal flow voids. Skull and upper cervical spine: Normal marrow signal. Joint effusions in the right-greater-than-left occipital condylar-C1 joints. Sinuses/Orbits: Unremarkable. Other: None. MRA HEAD FINDINGS Anterior circulation: Intracranial ICAs are patent without stenosis or aneurysm. Mild stenosis of the right MCA, M1 segment. The left MCA and bilateral ACAs are patent proximally without stenosis or aneurysm. Distal branches are symmetric. Posterior circulation: Visualized portions of the distal vertebral arteries and basilar artery are patent without stenosis or aneurysm. The SCAs, AICAs and PICAs are patent proximally. The PCAs are patent proximally without stenosis or aneurysm. Distal branches are symmetric. Anatomic variants: Hypoplastic A1 segment of the right ACA. IMPRESSION: 1. Acute infarct in the right basal ganglia. No acute hemorrhage or significant mass effect. 2. Mild stenosis of the right MCA, M1 segment. Electronically Signed   By: Orvan Falconer M.D.   On: 06/14/2022 08:32   CT Head Wo Contrast  Result Date: 06/13/2022 CLINICAL DATA:  Confusion. EXAM: CT HEAD WITHOUT CONTRAST TECHNIQUE: Contiguous axial images were obtained from the base of the skull through the vertex without intravenous contrast. RADIATION DOSE REDUCTION: This exam was performed according to the departmental dose-optimization program which includes automated exposure control, adjustment of the mA  and/or kV according to patient size and/or use of iterative reconstruction technique. COMPARISON:  CT and MR examination dated September 14, 2021 FINDINGS: Brain: No evidence of acute infarction, hemorrhage, hydrocephalus, extra-axial collection or mass lesion/mass effect. Right basal ganglia chronic infarct and centrum semiovale. Vascular: No hyperdense vessel or unexpected calcification. Skull: Normal. Negative for fracture or focal lesion. Sinuses/Orbits: No acute finding. Other: None. IMPRESSION: 1. No acute intracranial pathology. 2. Chronic right MCA territory infarct. Electronically Signed   By: Larose Hires D.O.   On: 06/13/2022 22:18      Subjective: Seen and examined on the day of discharge.  Stable no distress.  Back to baseline.  Appropriate for discharge home.  Discharge Exam: Vitals:   06/23/22 0415 06/23/22 0846  BP: (!) 135/94 Marland Kitchen)  162/72  Pulse:  (!) 51  Resp:  18  Temp: 98.9 F (37.2 C) 98.2 F (36.8 C)  SpO2: 96% 97%   Vitals:   06/22/22 2200 06/23/22 0015 06/23/22 0415 06/23/22 0846  BP: (!) 163/75 (!) 149/70 (!) 135/94 (!) 162/72  Pulse: 75 62  (!) 51  Resp: 18 18  18   Temp: 98.9 F (37.2 C) 99 F (37.2 C) 98.9 F (37.2 C) 98.2 F (36.8 C)  TempSrc: Oral Oral Oral   SpO2: 95% 97% 96% 97%  Weight:      Height:        General: Pt is alert, awake, not in acute distress Cardiovascular: RRR, S1/S2 +, no rubs, no gallops Respiratory: CTA bilaterally, no wheezing, no rhonchi Abdominal: Soft, NT, ND, bowel sounds + Extremities: no edema, no cyanosis    The results of significant diagnostics from this hospitalization (including imaging, microbiology, ancillary and laboratory) are listed below for reference.     Microbiology: Recent Results (from the past 240 hour(s))  Resp panel by RT-PCR (RSV, Flu A&B, Covid) Anterior Nasal Swab     Status: None   Collection Time: 06/20/22  5:02 PM   Specimen: Anterior Nasal Swab  Result Value Ref Range Status   SARS  Coronavirus 2 by RT PCR NEGATIVE NEGATIVE Final    Comment: (NOTE) SARS-CoV-2 target nucleic acids are NOT DETECTED.  The SARS-CoV-2 RNA is generally detectable in upper respiratory specimens during the acute phase of infection. The lowest concentration of SARS-CoV-2 viral copies this assay can detect is 138 copies/mL. A negative result does not preclude SARS-Cov-2 infection and should not be used as the sole basis for treatment or other patient management decisions. A negative result may occur with  improper specimen collection/handling, submission of specimen other than nasopharyngeal swab, presence of viral mutation(s) within the areas targeted by this assay, and inadequate number of viral copies(<138 copies/mL). A negative result must be combined with clinical observations, patient history, and epidemiological information. The expected result is Negative.  Fact Sheet for Patients:  BloggerCourse.com  Fact Sheet for Healthcare Providers:  SeriousBroker.it  This test is no t yet approved or cleared by the [place] FDA and  has been authorized for detection and/or diagnosis of SARS-CoV-2 by FDA under an Emergency Use Authorization (EUA). This EUA will remain  in effect (meaning this test can be used) for the duration of the COVID-19 declaration under Section 564(b)(1) of the Act, 21 U.S.C.section 360bbb-3(b)(1), unless the authorization is terminated  or revoked sooner.       Influenza A by PCR NEGATIVE NEGATIVE Final   Influenza B by PCR NEGATIVE NEGATIVE Final    Comment: (NOTE) The Xpert Xpress SARS-CoV-2/FLU/RSV plus assay is intended as an aid in the diagnosis of influenza from Nasopharyngeal swab specimens and should not be used as a sole basis for treatment. Nasal washings and aspirates are unacceptable for Xpert Xpress SARS-CoV-2/FLU/RSV testing.  Fact Sheet for  Patients: BloggerCourse.com  Fact Sheet for Healthcare Providers: SeriousBroker.it  This test is not yet approved or cleared by the [place] FDA and has been authorized for detection and/or diagnosis of SARS-CoV-2 by FDA under an Emergency Use Authorization (EUA). This EUA will remain in effect (meaning this test can be used) for the duration of the COVID-19 declaration under Section 564(b)(1) of the Act, 21 U.S.C. section 360bbb-3(b)(1), unless the authorization is terminated or revoked.     Resp Syncytial Virus by PCR NEGATIVE NEGATIVE Final    Comment: (  NOTE) Fact Sheet for Patients: BloggerCourse.com  Fact Sheet for Healthcare Providers: SeriousBroker.it  This test is not yet approved or cleared by the [place] FDA and has been authorized for detection and/or diagnosis of SARS-CoV-2 by FDA under an Emergency Use Authorization (EUA). This EUA will remain in effect (meaning this test can be used) for the duration of the COVID-19 declaration under Section 564(b)(1) of the Act, 21 U.S.C. section 360bbb-3(b)(1), unless the authorization is terminated or revoked.  Performed at [facility], [address] [place], [place] [address]   Blood Culture (routine x 2)     Status: None (Preliminary result)   Collection Time: 06/20/22  5:02 PM   Specimen: BLOOD  Result Value Ref Range Status   Specimen Description BLOOD BLOOD RIGHT ARM  Final   Special Requests   Final    BOTTLES DRAWN AEROBIC AND ANAEROBIC Blood Culture results may not be optimal due to an inadequate volume of blood received in culture bottles   Culture   Final    NO GROWTH 2 DAYS Performed at [facility], [address], [place], [place] [address]    Report Status PENDING  Incomplete  Urine Culture     Status: Abnormal   Collection Time: 06/20/22  5:02 PM   Specimen: Urine, Random   Result Value Ref Range Status   Specimen Description   Final    URINE, RANDOM Performed at [facility], [address] [place], [place] [address]    Special Requests   Final    NONE Reflexed from [id number] Performed at [facility], [address] [place], [place] [address]    Culture 20,000 COLONIES/mL ESCHERICHIA COLI (A)  Final   Report Status 06/23/2022 FINAL  Final   Organism ID, Bacteria ESCHERICHIA COLI (A)  Final      Susceptibility   Escherichia coli - MIC*    AMPICILLIN >=32 RESISTANT Resistant     CEFAZOLIN 32 INTERMEDIATE Intermediate     CEFEPIME <=0.12 SENSITIVE Sensitive     CEFTRIAXONE <=0.25 SENSITIVE Sensitive     CIPROFLOXACIN <=0.25 SENSITIVE Sensitive     GENTAMICIN <=1 SENSITIVE Sensitive     IMIPENEM <=0.25 SENSITIVE Sensitive     NITROFURANTOIN <=16 SENSITIVE Sensitive     TRIMETH/SULFA <=20 SENSITIVE Sensitive     AMPICILLIN/SULBACTAM >=32 RESISTANT Resistant     PIP/TAZO <=4 SENSITIVE Sensitive     * 20,000 COLONIES/mL ESCHERICHIA COLI  Blood Culture (routine x 2)     Status: None (Preliminary result)   Collection Time: 06/20/22  5:04 PM   Specimen: BLOOD  Result Value Ref Range Status   Specimen Description BLOOD BLOOD LEFT ARM  Final   Special Requests   Final    BOTTLES DRAWN AEROBIC AND ANAEROBIC Blood Culture adequate volume   Culture   Final    NO GROWTH 2 DAYS Performed at [facility], [address] [place], [place] [address]    Report Status PENDING  Incomplete     Labs: BNP (last 3 results) No results for input(s): "BNP" in the last 8760 hours. Basic Metabolic Panel: Recent Labs  Lab 06/20/22 1702 06/22/22 0628 06/23/22 0522  NA 134* 137 134*  K 3.6 3.6 3.8  CL 103 104 103  CO2 21* 24 24  GLUCOSE 254* 111* 124*  BUN 25* 32* 29*  CREATININE 1.95* 1.99* 1.82*  CALCIUM 9.1 8.7* 9.2   Liver Function Tests: Recent Labs  Lab 06/20/22 1702  AST 26  ALT 12  ALKPHOS 69  BILITOT 1.1  PROT  6.2*  ALBUMIN 3.4*   No results for input(s): "LIPASE", "AMYLASE" in the last 168 hours. No results for input(s): "AMMONIA" in the last 168 hours. CBC: Recent Labs  Lab 06/20/22 1702 06/22/22 0628 06/23/22 0522  WBC 15.3* 12.2* 8.4  NEUTROABS 13.9*  --  6.2  HGB 11.6* 10.5* 10.5*  HCT 34.0* 31.0* 30.5*  MCV 89.0 89.3 87.6  PLT 172 133* 147*   Cardiac Enzymes: No results for input(s): "CKTOTAL", "CKMB", "CKMBINDEX", "TROPONINI" in the last 168 hours. BNP: Invalid input(s): "POCBNP" CBG: Recent Labs  Lab 06/22/22 0839 06/22/22 1158 06/22/22 1655 06/22/22 2321 06/23/22 0926  GLUCAP 114* 109* 161* 107* 165*   D-Dimer No results for input(s): "DDIMER" in the last 72 hours. Hgb A1c No results for input(s): "HGBA1C" in the last 72 hours. Lipid Profile No results for input(s): "CHOL", "HDL", "LDLCALC", "TRIG", "CHOLHDL", "LDLDIRECT" in the last 72 hours. Thyroid function studies No results for input(s): "TSH", "T4TOTAL", "T3FREE", "THYROIDAB" in the last 72 hours.  Invalid input(s): "FREET3" Anemia work up No results for input(s): "VITAMINB12", "FOLATE", "FERRITIN", "TIBC", "IRON", "RETICCTPCT" in the last 72 hours. Urinalysis    Component Value Date/Time   COLORURINE YELLOW (A) 06/20/2022 1702   APPEARANCEUR CLOUDY (A) 06/20/2022 1702   APPEARANCEUR Clear 06/26/2019 1040   LABSPEC 1.011 06/20/2022 1702   PHURINE 6.0 06/20/2022 1702   GLUCOSEU 150 (A) 06/20/2022 1702   HGBUR LARGE (A) 06/20/2022 1702   BILIRUBINUR NEGATIVE 06/20/2022 1702   BILIRUBINUR Negative 06/26/2019 1040   KETONESUR 5 (A) 06/20/2022 1702   PROTEINUR 100 (A) 06/20/2022 1702   NITRITE POSITIVE (A) 06/20/2022 1702   LEUKOCYTESUR LARGE (A) 06/20/2022 1702   Sepsis Labs Recent Labs  Lab 06/20/22 1702 06/22/22 0628 06/23/22 0522  WBC 15.3* 12.2* 8.4   Microbiology Recent Results (from the past 240 hour(s))  Resp panel by RT-PCR (RSV, Flu A&B, Covid) Anterior Nasal Swab     Status: None    Collection Time: 06/20/22  5:02 PM   Specimen: Anterior Nasal Swab  Result Value Ref Range Status   SARS Coronavirus 2 by RT PCR NEGATIVE NEGATIVE Final    Comment: (NOTE) SARS-CoV-2 target nucleic acids are NOT DETECTED.  The SARS-CoV-2 RNA is generally detectable in upper respiratory specimens during the acute phase of infection. The lowest concentration of SARS-CoV-2 viral copies this assay can detect is 138 copies/mL. A negative result does not preclude SARS-Cov-2 infection and should not be used as the sole basis for treatment or other patient management decisions. A negative result may occur with  improper specimen collection/handling, submission of specimen other than nasopharyngeal swab, presence of viral mutation(s) within the areas targeted by this assay, and inadequate number of viral copies(<138 copies/mL). A negative result must be combined with clinical observations, patient history, and epidemiological information. The expected result is Negative.  Fact Sheet for Patients:  BloggerCourse.com  Fact Sheet for Healthcare Providers:  SeriousBroker.it  This test is no t yet approved or cleared by the [place] FDA and  has been authorized for detection and/or diagnosis of SARS-CoV-2 by FDA under an Emergency Use Authorization (EUA). This EUA will remain  in effect (meaning this test can be used) for the duration of the COVID-19 declaration under Section 564(b)(1) of the Act, 21 U.S.C.section 360bbb-3(b)(1), unless the authorization is terminated  or revoked sooner.       Influenza A by PCR NEGATIVE NEGATIVE Final   Influenza  B by PCR NEGATIVE NEGATIVE Final    Comment: (NOTE) The Xpert Xpress SARS-CoV-2/FLU/RSV plus assay is intended as an aid in the diagnosis of influenza from Nasopharyngeal swab specimens and should not be used as a sole basis for treatment. Nasal washings and aspirates are unacceptable for  Xpert Xpress SARS-CoV-2/FLU/RSV testing.  Fact Sheet for Patients: BloggerCourse.com  Fact Sheet for Healthcare Providers: SeriousBroker.it  This test is not yet approved or cleared by the [place] FDA and has been authorized for detection and/or diagnosis of SARS-CoV-2 by FDA under an Emergency Use Authorization (EUA). This EUA will remain in effect (meaning this test can be used) for the duration of the COVID-19 declaration under Section 564(b)(1) of the Act, 21 U.S.C. section 360bbb-3(b)(1), unless the authorization is terminated or revoked.     Resp Syncytial Virus by PCR NEGATIVE NEGATIVE Final    Comment: (NOTE) Fact Sheet for Patients: BloggerCourse.com  Fact Sheet for Healthcare Providers: SeriousBroker.it  This test is not yet approved or cleared by the [place] FDA and has been authorized for detection and/or diagnosis of SARS-CoV-2 by FDA under an Emergency Use Authorization (EUA). This EUA will remain in effect (meaning this test can be used) for the duration of the COVID-19 declaration under Section 564(b)(1) of the Act, 21 U.S.C. section 360bbb-3(b)(1), unless the authorization is terminated or revoked.  Performed at [facility], [address], [place], [place] [address]   Blood Culture (routine x 2)     Status: None (Preliminary result)   Collection Time: 06/20/22  5:02 PM   Specimen: BLOOD  Result Value Ref Range Status   Specimen Description BLOOD BLOOD RIGHT ARM  Final   Special Requests   Final    BOTTLES DRAWN AEROBIC AND ANAEROBIC Blood Culture results may not be optimal due to an inadequate volume of blood received in culture bottles   Culture   Final    NO GROWTH 2 DAYS Performed at [facility], [address] [place], [place] [address]    Report Status PENDING  Incomplete  Urine Culture     Status: Abnormal    Collection Time: 06/20/22  5:02 PM   Specimen: Urine, Random  Result Value Ref Range Status   Specimen Description   Final    URINE, RANDOM Performed at [facility], [address], [place], [place] [address]    Special Requests   Final    NONE Reflexed from [id number] Performed at [facility], [address] [place], [place] [address]    Culture 20,000 COLONIES/mL ESCHERICHIA COLI (A)  Final   Report Status 06/23/2022 FINAL  Final   Organism ID, Bacteria ESCHERICHIA COLI (A)  Final      Susceptibility   Escherichia coli - MIC*    AMPICILLIN >=32 RESISTANT Resistant     CEFAZOLIN 32 INTERMEDIATE Intermediate     CEFEPIME <=0.12 SENSITIVE Sensitive     CEFTRIAXONE <=0.25 SENSITIVE Sensitive     CIPROFLOXACIN <=0.25 SENSITIVE Sensitive     GENTAMICIN <=1 SENSITIVE Sensitive     IMIPENEM <=0.25 SENSITIVE Sensitive     NITROFURANTOIN <=16 SENSITIVE Sensitive     TRIMETH/SULFA <=20 SENSITIVE Sensitive     AMPICILLIN/SULBACTAM >=32 RESISTANT Resistant     PIP/TAZO <=4 SENSITIVE Sensitive     * 20,000 COLONIES/mL ESCHERICHIA COLI  Blood Culture (routine x 2)     Status: None (Preliminary result)   Collection Time: 06/20/22  5:04 PM   Specimen: BLOOD  Result Value  Ref Range Status   Specimen Description BLOOD BLOOD LEFT ARM  Final   Special Requests   Final    BOTTLES DRAWN AEROBIC AND ANAEROBIC Blood Culture adequate volume   Culture   Final    NO GROWTH 2 DAYS Performed at [facility], [address] [place], [place] [address]    Report Status PENDING  Incomplete     Time coordinating discharge: Over 30 minutes  SIGNED:   Tresa Moore, MD  [facility] 06/23/2022, 11:41 AM Pager   If 7PM-7AM, please contact night-coverage

## 2022-06-23 NOTE — TOC Transition Note (Signed)
Transition of Care [facility]) - CM/SW Discharge Note   Patient Details  Name: Kurt Hernandez MRN: [id number] Date of Birth: 10/11/1940  Transition of Care [facility]) CM/SW Contact:  Kreg Shropshire, RN Phone Number: 06/23/2022, 11:53 AM   Clinical Narrative:    Received d/c orders. No TOC needs at this time. Wife will provide transport home. Patient has Outpatient Therapy Referral for PT/OT that was submitted on 6/5. Per MD pt to follow Outpatient referral at d/c        Patient Goals and CMS Choice        Expected Discharge Plan and Services           Expected Discharge Date: 06/23/22                                    Prior Living Arrangements/Services                       Activities of Daily Living Home Assistive Devices/Equipment: Gilmer Mor (specify quad or straight), Eyeglasses ADL Screening (condition at time of admission) Patient's cognitive ability adequate to safely complete daily activities?: Yes Is the patient deaf or have difficulty hearing?: No Does the patient have difficulty seeing, even when wearing glasses/contacts?: No Does the patient have difficulty concentrating, remembering, or making decisions?: No Patient able to express need for assistance with ADLs?: Yes Does the patient have difficulty dressing or bathing?: No Independently performs ADLs?: No Communication: Independent Dressing (OT): Independent Grooming: Independent Feeding: Independent Bathing: Needs assistance Is this a change from baseline?: Change from baseline, expected to last <3 days Toileting: Needs assistance Is this a change from baseline?: Change from baseline, expected to last <3 days In/Out Bed: Needs assistance Is this a change from baseline?: Change from baseline, expected to last <3 days Walks in Home: Needs assistance Is this a change from baseline?: Change from baseline, expected to last <3 days Does the patient have difficulty walking or climbing stairs?:  Yes Weakness of Legs: Left Weakness of Arms/Hands: None  Permission Sought/Granted                  Emotional Assessment              Admission diagnosis:  Dysuria [R30.0] Sepsis secondary to UTI (HCC) [A41.9, N39.0] Urinary tract infection without hematuria, site unspecified [N39.0] Patient Active Problem List   Diagnosis Date Noted   Dysuria 06/20/2022   Sepsis secondary to UTI (HCC) 06/20/2022   Elevated troponin 06/20/2022   Acute cerebrovascular accident (CVA) (HCC) 06/14/2022   Protein-calorie malnutrition, severe 03/09/2022   Acute kidney injury superimposed on chronic kidney disease (HCC) 03/07/2022   GI bleed 03/07/2022   Type 2 diabetes mellitus with stage 3b chronic kidney disease, without long-term current use of insulin (HCC) 04/26/2019   History of bilateral pulmonary embolism 2021 04/16/2019   History of atrial fibrillation 04/16/2019   Hyperlipidemia, mixed 06/27/2018   Abdominal wall hernia 12/29/2017   Nephrolithiasis 12/29/2017   Preoperative cardiovascular examination 10/24/2017   Peroneal tendinitis 09/08/2017   Osteoarthritis of wrist 09/08/2017   Closed fracture of heel bone 09/08/2017   Osteoarthritis of carpometacarpal (CMC) joint of thumb 09/08/2017   Acquired hypothyroidism 11/21/2016   PVC's (premature ventricular contractions) 08/24/2016   Benign essential hypertension 11/19/2015   Cervical disc disorder with radiculopathy 11/19/2015   Lumbar radiculopathy 06/17/2013   Peripheral neuropathy 05/13/2013   Pernicious  anemia 05/13/2013   PCP:  Danella Penton, MD Pharmacy:   [facility] [address] [place] - [address] [place] [address] Phone: [phone] Fax: [phone]  [facility] [address] [place] [address] Phone: [phone] Fax: [phone]     Social Determinants of Health (SDOH) Interventions    Readmission Risk Interventions     No data to  display                 Patient Goals and CMS Choice      Discharge Placement                         Discharge Plan and Services Additional resources added to the After Visit Summary for                                       Social Determinants of Health (SDOH) Interventions SDOH Screenings   Food Insecurity: No Food Insecurity (06/21/2022)  Housing: Low Risk  (06/21/2022)  Transportation Needs: No Transportation Needs (06/21/2022)  Utilities: Not At Risk (06/21/2022)  Tobacco Use: Low Risk  (06/21/2022)     Readmission Risk Interventions     No data to display

## 2022-06-23 NOTE — Progress Notes (Signed)
Called Patient's wife and left voicemails.

## 2022-06-23 NOTE — Progress Notes (Addendum)
CCMD called and reported of wondering atrial pacemaker. VSS except HR is running from 51 to 65 and temperature at 99. Pt stable  at this time. MD Mansy made aware. Will continue to monitor.  Update 0033: MD Mansy placed order. Will continue to monitor.

## 2022-06-23 NOTE — Plan of Care (Signed)
  Problem: Education: Goal: Knowledge of General Education information will improve Description: Including pain rating scale, medication(s)/side effects and non-pharmacologic comfort measures Outcome: Progressing   Problem: Clinical Measurements: Goal: Will remain free from infection Outcome: Progressing   Problem: Clinical Measurements: Goal: Respiratory complications will improve Outcome: Progressing   Problem: Clinical Measurements: Goal: Cardiovascular complication will be avoided Outcome: Progressing   Problem: Nutrition: Goal: Adequate nutrition will be maintained Outcome: Progressing   Problem: Pain Managment: Goal: General experience of comfort will improve Outcome: Progressing   Problem: Safety: Goal: Ability to remain free from injury will improve Outcome: Progressing   

## 2022-06-24 LAB — CULTURE, BLOOD (ROUTINE X 2): Culture: NO GROWTH

## 2022-06-25 LAB — CULTURE, BLOOD (ROUTINE X 2): Special Requests: ADEQUATE

## 2022-06-29 ENCOUNTER — Other Ambulatory Visit: Payer: Self-pay

## 2022-06-30 ENCOUNTER — Telehealth: Payer: Self-pay

## 2022-06-30 NOTE — Telephone Encounter (Signed)
Auth Submission: NO AUTH NEEDED Site of care: Site of care: [facility] Payer: Medicare & Medico Supplement Medication & CPT/J Code(s) submitted: Leqvio (Inclisiran) [id number]  Auth type: Pharmacy Benefit Units/visits requested: 284mg  q 3 months x 2 doses  Approval from: 06/30/2022 to 01/10/2023

## 2022-07-01 ENCOUNTER — Other Ambulatory Visit: Payer: Self-pay

## 2022-07-01 ENCOUNTER — Emergency Department: Payer: Medicare Other

## 2022-07-01 DIAGNOSIS — I10 Essential (primary) hypertension: Secondary | ICD-10-CM | POA: Insufficient documentation

## 2022-07-01 DIAGNOSIS — R42 Dizziness and giddiness: Secondary | ICD-10-CM | POA: Insufficient documentation

## 2022-07-01 DIAGNOSIS — E119 Type 2 diabetes mellitus without complications: Secondary | ICD-10-CM | POA: Insufficient documentation

## 2022-07-01 DIAGNOSIS — R29810 Facial weakness: Secondary | ICD-10-CM | POA: Diagnosis not present

## 2022-07-01 DIAGNOSIS — R55 Syncope and collapse: Secondary | ICD-10-CM | POA: Diagnosis not present

## 2022-07-01 DIAGNOSIS — Z7901 Long term (current) use of anticoagulants: Secondary | ICD-10-CM | POA: Insufficient documentation

## 2022-07-01 DIAGNOSIS — R531 Weakness: Secondary | ICD-10-CM | POA: Insufficient documentation

## 2022-07-01 DIAGNOSIS — R300 Dysuria: Secondary | ICD-10-CM | POA: Insufficient documentation

## 2022-07-01 DIAGNOSIS — W1830XA Fall on same level, unspecified, initial encounter: Secondary | ICD-10-CM | POA: Diagnosis not present

## 2022-07-01 LAB — CBC
HCT: 36.2 % — ABNORMAL LOW (ref 39.0–52.0)
Hemoglobin: 12.3 g/dL — ABNORMAL LOW (ref 13.0–17.0)
MCH: 30.2 pg (ref 26.0–34.0)
MCHC: 34 g/dL (ref 30.0–36.0)
MCV: 88.9 fL (ref 80.0–100.0)
Platelets: 394 10*3/uL (ref 150–400)
RBC: 4.07 MIL/uL — ABNORMAL LOW (ref 4.22–5.81)
RDW: 12.5 % (ref 11.5–15.5)
WBC: 10.5 10*3/uL (ref 4.0–10.5)
nRBC: 0 % (ref 0.0–0.2)

## 2022-07-01 LAB — COMPREHENSIVE METABOLIC PANEL
ALT: 12 U/L (ref 0–44)
AST: 13 U/L — ABNORMAL LOW (ref 15–41)
Albumin: 3.4 g/dL — ABNORMAL LOW (ref 3.5–5.0)
Alkaline Phosphatase: 80 U/L (ref 38–126)
Anion gap: 5 (ref 5–15)
BUN: 24 mg/dL — ABNORMAL HIGH (ref 8–23)
CO2: 26 mmol/L (ref 22–32)
Calcium: 9.2 mg/dL (ref 8.9–10.3)
Chloride: 100 mmol/L (ref 98–111)
Creatinine, Ser: 1.97 mg/dL — ABNORMAL HIGH (ref 0.61–1.24)
GFR, Estimated: 34 mL/min — ABNORMAL LOW (ref >60)
Glucose, Bld: 261 mg/dL — ABNORMAL HIGH (ref 70–99)
Potassium: 3.7 mmol/L (ref 3.5–5.1)
Sodium: 131 mmol/L — ABNORMAL LOW (ref 135–145)
Total Bilirubin: 0.6 mg/dL (ref 0.3–1.2)
Total Protein: 6.7 g/dL (ref 6.5–8.1)

## 2022-07-01 LAB — TROPONIN I (HIGH SENSITIVITY): Troponin I (High Sensitivity): 9 ng/L (ref <18)

## 2022-07-01 LAB — CBG MONITORING, ED: Glucose-Capillary: 260 mg/dL — ABNORMAL HIGH (ref 70–99)

## 2022-07-01 NOTE — ED Triage Notes (Signed)
First Nurse Note:   BIB [facility] from home. Pt reports weakness since CVA 2 wks ago. Hx of 10 prior strokes. Residual L sided facial droop since. Pt was sitting on his porch and when walking across it became weak and diaphoretic and fell onto the bricks. Denies hitting head or LOC at that time. Pt is on daily eliquis. HR ranges between 60-125 with EMS with no prior hx of EMS. 2 EKG transmitted to MD on scene with NSR noted per provider. Pt denies chest pain or sensation of palpitations. Pt completed course of abx for UTI on Tuesday. Pt alert and oriented on arrival.    144/83 99% RA 97.7 temp CBG 290-- has not had insulin today. States typically runs 130's-160's RR 18

## 2022-07-01 NOTE — ED Triage Notes (Signed)
Pt to ED via [facility] c/o fall and weakness. Pt reports he fell today after feeling dizzy. Denies hitting head, no LOC. Pt reports he's been feeling weak for a couple of weeks. Had a stroke recently and has hx of 10 previous strokes. Denies CP, SOB- states he "just feels tired". Pt reports he always feels like this when he has a UTI

## 2022-07-02 ENCOUNTER — Other Ambulatory Visit: Payer: Self-pay

## 2022-07-02 ENCOUNTER — Emergency Department
Admission: EM | Admit: 2022-07-02 | Discharge: 2022-07-02 | Disposition: A | Discharge status: Home / Self Care | Payer: Medicare Other | Attending: Emergency Medicine | Admitting: Emergency Medicine

## 2022-07-02 DIAGNOSIS — W19XXXA Unspecified fall, initial encounter: Secondary | ICD-10-CM

## 2022-07-02 DIAGNOSIS — R55 Syncope and collapse: Secondary | ICD-10-CM

## 2022-07-02 DIAGNOSIS — R42 Dizziness and giddiness: Secondary | ICD-10-CM

## 2022-07-02 DIAGNOSIS — R531 Weakness: Secondary | ICD-10-CM

## 2022-07-02 DIAGNOSIS — R300 Dysuria: Secondary | ICD-10-CM

## 2022-07-02 LAB — URINALYSIS, ROUTINE W REFLEX MICROSCOPIC
Bacteria, UA: NONE SEEN
Bilirubin Urine: NEGATIVE
Glucose, UA: 50 mg/dL — AB
Hgb urine dipstick: NEGATIVE
Ketones, ur: NEGATIVE mg/dL
Nitrite: NEGATIVE
Protein, ur: NEGATIVE mg/dL
Specific Gravity, Urine: 1.018 (ref 1.005–1.030)
pH: 5 (ref 5.0–8.0)

## 2022-07-02 LAB — TROPONIN I (HIGH SENSITIVITY): Troponin I (High Sensitivity): 10 ng/L (ref <18)

## 2022-07-02 NOTE — ED Notes (Signed)
ED Provider at bedside. 

## 2022-07-02 NOTE — Discharge Instructions (Addendum)
Call Dr. Hyacinth Meeker to talk about physical therapy as you have already planned.  Call Dr. Lonna Cobb for an appointment to check on your ongoing urinary symptoms and discuss treatment options.  It does not appear that you have a urine infection at this time.  I made a referral to a cardiologist who can better evaluate your heart rhythms with further testing.  When you are at home make sure that you use your cane at all times.  As we discussed, take your time when getting up from laying down or sitting to prevent lightheadedness from standing or walking too quickly.

## 2022-07-02 NOTE — ED Provider Notes (Signed)
[facility] Provider Note    Event Date/Time   First MD Initiated Contact with Patient 07/02/22 0154     (approximate)   History   Weakness and Fall   HPI  Kurt Hernandez is a 82 y.o. male   Past medical history of CVA, DM, HTN, kidney stone, who presents to the emergency department with an episode of lightheadedness upon standing and walking up the porch stairs to his home.  When he got to the door he felt lightheaded slumped over into the doorway but denies hitting his head or loss of consciousness.  He now feels normal.  He was recently diagnosed with a urinary tract infection and finished his ciprofloxacin.  He continues to have some dysuria.  He denies fever or chills.  He denied chest pain, shortness of breath.  Has been compliant with medications.  He does take Eliquis.  From his prior stroke, most recently earlier this month he has right-sided motor weakness but has been able to ambulate with cane.  Independent Historian contributed to assessment above: Both his wife and daughter-in-law are at bedside to corroborate information given above  External Medical Documents Reviewed: Discharge summary from 06/23/2022 with stroke and urinary tract infection.      Physical Exam   Triage Vital Signs: ED Triage Vitals  Enc Vitals Group     BP 07/01/22 2043 (!) 143/87     Pulse Rate 07/01/22 2043 72     Resp 07/01/22 2043 16     Temp 07/01/22 2043 98.4 F (36.9 C)     Temp Source 07/01/22 2043 Oral     SpO2 07/01/22 2043 99 %     Weight 07/01/22 2056 160 lb (72.6 kg)     Height 07/01/22 2056 6\' 2"  (1.88 m)     Head Circumference --      Peak Flow --      Pain Score 07/01/22 2056 0     Pain Loc --      Pain Edu? --      Excl. in GC? --     Most recent vital signs: Vitals:   07/02/22 0013 07/02/22 0200  BP: (!) 148/101 (!) 150/81  Pulse: 61 (!) 56  Resp: 16 20  Temp: 98.3 F (36.8 C)   SpO2: 100% 98%    General: Awake, no distress.   CV:  Good peripheral perfusion.  Resp:  Normal effort.  Abd:  No distention.  Other:  Awake alert comfortable moving all extremities with full active range of motion no obvious signs of head trauma.  Clear lungs soft nontender abdomen.  Mild left-sided facial droop and perhaps the slightest decrease in grip strength on the left compared to right   ED Results / Procedures / Treatments   Labs (all labs ordered are listed, but only abnormal results are displayed) Labs Reviewed  CBC - Abnormal; Notable for the following components:      Result Value   RBC 4.07 (*)    Hemoglobin 12.3 (*)    HCT 36.2 (*)    All other components within normal limits  URINALYSIS, ROUTINE W REFLEX MICROSCOPIC - Abnormal; Notable for the following components:   Color, Urine YELLOW (*)    APPearance HAZY (*)    Glucose, UA 50 (*)    Leukocytes,Ua TRACE (*)    All other components within normal limits  COMPREHENSIVE METABOLIC PANEL - Abnormal; Notable for the following components:   Sodium 131 (*)  Glucose, Bld 261 (*)    BUN 24 (*)    Creatinine, Ser 1.97 (*)    Albumin 3.4 (*)    AST 13 (*)    GFR, Estimated 34 (*)    All other components within normal limits  CBG MONITORING, ED - Abnormal; Notable for the following components:   Glucose-Capillary 260 (*)    All other components within normal limits  TROPONIN I (HIGH SENSITIVITY)  TROPONIN I (HIGH SENSITIVITY)     I ordered and reviewed the above labs they are notable for H&H at baseline, white blood cell count normal, no bacteria or inflammatory changes in the urine  EKG  ED ECG REPORT I, Pilar Jarvis, the attending physician, personally viewed and interpreted this ECG.   Date: 07/02/2022  EKG Time: 0312  Rate: 58  Rhythm: sinus w premature contractions  Axis: nl  Intervals:none  ST&T Change: no stemi    RADIOLOGY I independently reviewed and interpreted CT of the head and see no obvious bleeding or midline  shift   PROCEDURES:  Critical Care performed: No  Procedures   MEDICATIONS ORDERED IN ED: Medications - No data to display   IMPRESSION / MDM / ASSESSMENT AND PLAN / ED COURSE  I reviewed the triage vital signs and the nursing notes.                                Patient's presentation is most consistent with acute presentation with potential threat to life or bodily function.  Differential diagnosis includes, but is not limited to, urinary tract infection, dysrhythmia, orthostatic hypotension, vasovagal syncope, ACS   The patient is on the cardiac monitor to evaluate for evidence of arrhythmia and/or significant heart rate changes.  MDM:    This patient had orthostatic lightheadedness with an associated flushing station prior to near syncopal episode never fully lost consciousness denies head strike or significant injury.  Chief complaint now is generalized weakness ever since his recent stroke and hospitalization with ongoing dysuria completed course of ciprofloxacin for urine infection, I reviewed prior cultures with E. coli that was sensitive to ciprofloxacin.  Urinalysis today shows no signs of infection.  He states that he does have BPH, has met with urologist Dr. Lonna Cobb in the past, I advised a follow-up given his ongoing urinary symptoms.  He has PVCs and concern from EMS reportedly questionable A-fib on their cardiac monitoring, I have no evidence of this currently, and he is already on Eliquis and his rate is well-controlled at this time in the 60s consistently throughout his hospital stay.  I made referral to cardiology for potential Holter monitoring in the setting of questionable dysrhythmias.  Given unremarkable exam and evaluation, stable in the emergency department, he will be discharged to his home at this time which the patient is very eager to return to.  He will follow-up with PMD for physical therapy evaluations as planned and he understands to return if any  new or worsening symptoms.         FINAL CLINICAL IMPRESSION(S) / ED DIAGNOSES   Final diagnoses:  Dysuria  Generalized weakness  Fall, initial encounter  Near syncope  Orthostatic lightheadedness     Rx / DC Orders   ED Discharge Orders          Ordered    Ambulatory referral to Cardiology        07/02/22 [id number]  Note:  This document was prepared using Dragon voice recognition software and may include unintentional dictation errors.    Pilar Jarvis, MD 07/02/22 [phone]

## 2022-07-02 NOTE — ED Notes (Signed)
PT brought to ed rm 2 at this time, this RN now assuming care.  

## 2022-08-04 ENCOUNTER — Ambulatory Visit: Payer: Medicare Other

## 2022-08-04 VITALS — BP 165/87 | HR 58 | Temp 97.8°F | Resp 16 | Ht 74.0 in | Wt 138.8 lb

## 2022-08-04 DIAGNOSIS — E782 Mixed hyperlipidemia: Secondary | ICD-10-CM | POA: Diagnosis not present

## 2022-08-04 MED ORDER — INCLISIRAN SODIUM 284 MG/1.5ML ~~LOC~~ SOSY
284.0000 mg | PREFILLED_SYRINGE | Freq: Once | SUBCUTANEOUS | Status: AC
  Administered 2022-08-04: 284 mg via SUBCUTANEOUS
  Filled 2022-08-04: qty 1.5

## 2022-08-04 NOTE — Progress Notes (Signed)
Diagnosis: Hyperlipidemia  Provider:  Chilton Greathouse MD  Procedure: Injection  Leqvio (inclisiran), Dose: 284 mg, Site: subcutaneous, Number of injections: 1  Administered in left arm.  Post Care: Observation period completed  Discharge: Condition: Good, Destination: Home . AVS Provided  Performed by:  Wyvonne Lenz, RN

## 2022-08-08 ENCOUNTER — Other Ambulatory Visit: Payer: Self-pay | Admitting: Nurse Practitioner

## 2022-09-30 ENCOUNTER — Other Ambulatory Visit: Payer: Self-pay

## 2022-09-30 ENCOUNTER — Emergency Department: Payer: Medicare Other

## 2022-09-30 DIAGNOSIS — I214 Non-ST elevation (NSTEMI) myocardial infarction: Principal | ICD-10-CM | POA: Diagnosis present

## 2022-09-30 DIAGNOSIS — E1165 Type 2 diabetes mellitus with hyperglycemia: Secondary | ICD-10-CM | POA: Diagnosis present

## 2022-09-30 DIAGNOSIS — J9601 Acute respiratory failure with hypoxia: Secondary | ICD-10-CM | POA: Diagnosis present

## 2022-09-30 DIAGNOSIS — E11649 Type 2 diabetes mellitus with hypoglycemia without coma: Secondary | ICD-10-CM | POA: Diagnosis not present

## 2022-09-30 DIAGNOSIS — I4510 Unspecified right bundle-branch block: Secondary | ICD-10-CM | POA: Diagnosis present

## 2022-09-30 DIAGNOSIS — R54 Age-related physical debility: Secondary | ICD-10-CM | POA: Diagnosis present

## 2022-09-30 DIAGNOSIS — D510 Vitamin B12 deficiency anemia due to intrinsic factor deficiency: Secondary | ICD-10-CM | POA: Diagnosis present

## 2022-09-30 DIAGNOSIS — Z8719 Personal history of other diseases of the digestive system: Secondary | ICD-10-CM

## 2022-09-30 DIAGNOSIS — N1832 Chronic kidney disease, stage 3b: Secondary | ICD-10-CM | POA: Diagnosis present

## 2022-09-30 DIAGNOSIS — N189 Chronic kidney disease, unspecified: Secondary | ICD-10-CM | POA: Diagnosis not present

## 2022-09-30 DIAGNOSIS — Z85819 Personal history of malignant neoplasm of unspecified site of lip, oral cavity, and pharynx: Secondary | ICD-10-CM

## 2022-09-30 DIAGNOSIS — Z7982 Long term (current) use of aspirin: Secondary | ICD-10-CM

## 2022-09-30 DIAGNOSIS — N400 Enlarged prostate without lower urinary tract symptoms: Secondary | ICD-10-CM | POA: Insufficient documentation

## 2022-09-30 DIAGNOSIS — Z79899 Other long term (current) drug therapy: Secondary | ICD-10-CM

## 2022-09-30 DIAGNOSIS — G9341 Metabolic encephalopathy: Secondary | ICD-10-CM | POA: Diagnosis not present

## 2022-09-30 DIAGNOSIS — Z87442 Personal history of urinary calculi: Secondary | ICD-10-CM

## 2022-09-30 DIAGNOSIS — Z888 Allergy status to other drugs, medicaments and biological substances status: Secondary | ICD-10-CM

## 2022-09-30 DIAGNOSIS — E876 Hypokalemia: Secondary | ICD-10-CM | POA: Diagnosis not present

## 2022-09-30 DIAGNOSIS — I3139 Other pericardial effusion (noninflammatory): Secondary | ICD-10-CM | POA: Diagnosis not present

## 2022-09-30 DIAGNOSIS — Z515 Encounter for palliative care: Secondary | ICD-10-CM | POA: Diagnosis not present

## 2022-09-30 DIAGNOSIS — Z1152 Encounter for screening for COVID-19: Secondary | ICD-10-CM | POA: Diagnosis not present

## 2022-09-30 DIAGNOSIS — I5023 Acute on chronic systolic (congestive) heart failure: Secondary | ICD-10-CM | POA: Diagnosis present

## 2022-09-30 DIAGNOSIS — E872 Acidosis, unspecified: Secondary | ICD-10-CM | POA: Diagnosis not present

## 2022-09-30 DIAGNOSIS — Z8616 Personal history of COVID-19: Secondary | ICD-10-CM | POA: Diagnosis not present

## 2022-09-30 DIAGNOSIS — D631 Anemia in chronic kidney disease: Secondary | ICD-10-CM | POA: Diagnosis present

## 2022-09-30 DIAGNOSIS — Z7901 Long term (current) use of anticoagulants: Secondary | ICD-10-CM

## 2022-09-30 DIAGNOSIS — N170 Acute kidney failure with tubular necrosis: Secondary | ICD-10-CM | POA: Diagnosis present

## 2022-09-30 DIAGNOSIS — E785 Hyperlipidemia, unspecified: Secondary | ICD-10-CM | POA: Diagnosis present

## 2022-09-30 DIAGNOSIS — Z8249 Family history of ischemic heart disease and other diseases of the circulatory system: Secondary | ICD-10-CM

## 2022-09-30 DIAGNOSIS — E039 Hypothyroidism, unspecified: Secondary | ICD-10-CM | POA: Diagnosis present

## 2022-09-30 DIAGNOSIS — Z794 Long term (current) use of insulin: Secondary | ICD-10-CM | POA: Diagnosis not present

## 2022-09-30 DIAGNOSIS — K219 Gastro-esophageal reflux disease without esophagitis: Secondary | ICD-10-CM | POA: Diagnosis present

## 2022-09-30 DIAGNOSIS — Z66 Do not resuscitate: Secondary | ICD-10-CM | POA: Diagnosis not present

## 2022-09-30 DIAGNOSIS — E871 Hypo-osmolality and hyponatremia: Secondary | ICD-10-CM | POA: Diagnosis present

## 2022-09-30 DIAGNOSIS — I462 Cardiac arrest due to underlying cardiac condition: Secondary | ICD-10-CM | POA: Diagnosis not present

## 2022-09-30 DIAGNOSIS — Z9181 History of falling: Secondary | ICD-10-CM

## 2022-09-30 DIAGNOSIS — N179 Acute kidney failure, unspecified: Secondary | ICD-10-CM | POA: Diagnosis not present

## 2022-09-30 DIAGNOSIS — G931 Anoxic brain damage, not elsewhere classified: Secondary | ICD-10-CM | POA: Diagnosis not present

## 2022-09-30 DIAGNOSIS — R079 Chest pain, unspecified: Secondary | ICD-10-CM | POA: Diagnosis present

## 2022-09-30 DIAGNOSIS — I4891 Unspecified atrial fibrillation: Secondary | ICD-10-CM | POA: Diagnosis present

## 2022-09-30 DIAGNOSIS — R008 Other abnormalities of heart beat: Secondary | ICD-10-CM | POA: Diagnosis not present

## 2022-09-30 DIAGNOSIS — I130 Hypertensive heart and chronic kidney disease with heart failure and stage 1 through stage 4 chronic kidney disease, or unspecified chronic kidney disease: Secondary | ICD-10-CM | POA: Diagnosis present

## 2022-09-30 DIAGNOSIS — R112 Nausea with vomiting, unspecified: Secondary | ICD-10-CM | POA: Diagnosis present

## 2022-09-30 DIAGNOSIS — Z79890 Hormone replacement therapy (postmenopausal): Secondary | ICD-10-CM

## 2022-09-30 DIAGNOSIS — E1122 Type 2 diabetes mellitus with diabetic chronic kidney disease: Secondary | ICD-10-CM | POA: Diagnosis present

## 2022-09-30 DIAGNOSIS — Z823 Family history of stroke: Secondary | ICD-10-CM

## 2022-09-30 DIAGNOSIS — R32 Unspecified urinary incontinence: Secondary | ICD-10-CM | POA: Diagnosis present

## 2022-09-30 DIAGNOSIS — R001 Bradycardia, unspecified: Secondary | ICD-10-CM | POA: Diagnosis not present

## 2022-09-30 DIAGNOSIS — E875 Hyperkalemia: Secondary | ICD-10-CM | POA: Diagnosis not present

## 2022-09-30 DIAGNOSIS — I469 Cardiac arrest, cause unspecified: Secondary | ICD-10-CM | POA: Diagnosis not present

## 2022-09-30 DIAGNOSIS — N401 Enlarged prostate with lower urinary tract symptoms: Secondary | ICD-10-CM | POA: Diagnosis present

## 2022-09-30 DIAGNOSIS — R34 Anuria and oliguria: Secondary | ICD-10-CM | POA: Diagnosis not present

## 2022-09-30 DIAGNOSIS — Z8673 Personal history of transient ischemic attack (TIA), and cerebral infarction without residual deficits: Secondary | ICD-10-CM

## 2022-09-30 DIAGNOSIS — I5021 Acute systolic (congestive) heart failure: Secondary | ICD-10-CM | POA: Diagnosis present

## 2022-09-30 LAB — CBC
HCT: 36.9 % — ABNORMAL LOW (ref 39.0–52.0)
Hemoglobin: 12.3 g/dL — ABNORMAL LOW (ref 13.0–17.0)
MCH: 30.8 pg (ref 26.0–34.0)
MCHC: 33.3 g/dL (ref 30.0–36.0)
MCV: 92.5 fL (ref 80.0–100.0)
Platelets: 266 10*3/uL (ref 150–400)
RBC: 3.99 MIL/uL — ABNORMAL LOW (ref 4.22–5.81)
RDW: 13.6 % (ref 11.5–15.5)
WBC: 11.7 10*3/uL — ABNORMAL HIGH (ref 4.0–10.5)
nRBC: 0 % (ref 0.0–0.2)

## 2022-09-30 LAB — PROTIME-INR
INR: 2 — ABNORMAL HIGH (ref 0.8–1.2)
Prothrombin Time: 23.2 seconds — ABNORMAL HIGH (ref 11.4–15.2)

## 2022-09-30 LAB — TROPONIN I (HIGH SENSITIVITY)
Troponin I (High Sensitivity): 2936 ng/L (ref <18)
Troponin I (High Sensitivity): 2918 ng/L (ref <18)

## 2022-09-30 LAB — BASIC METABOLIC PANEL
Anion gap: 15 (ref 5–15)
BUN: 54 mg/dL — ABNORMAL HIGH (ref 8–23)
CO2: 20 mmol/L — ABNORMAL LOW (ref 22–32)
Calcium: 9 mg/dL (ref 8.9–10.3)
Chloride: 98 mmol/L (ref 98–111)
Creatinine, Ser: 2.42 mg/dL — ABNORMAL HIGH (ref 0.61–1.24)
GFR, Estimated: 26 mL/min — ABNORMAL LOW (ref >60)
Glucose, Bld: 380 mg/dL — ABNORMAL HIGH (ref 70–99)
Potassium: 3.6 mmol/L (ref 3.5–5.1)
Sodium: 133 mmol/L — ABNORMAL LOW (ref 135–145)

## 2022-09-30 LAB — HEPARIN LEVEL (UNFRACTIONATED): Heparin Unfractionated: >1.1 IU/mL — ABNORMAL HIGH (ref 0.30–0.70)

## 2022-09-30 MED ORDER — HEPARIN (PORCINE) 25000 UT/250ML-% IV SOLN
1000.0000 [IU]/h | INTRAVENOUS | Status: DC
  Administered 2022-09-30: 900 [IU]/h via INTRAVENOUS
  Filled 2022-09-30 (×2): qty 250

## 2022-09-30 MED ORDER — SODIUM CHLORIDE 0.9 % IV BOLUS
500.0000 mL | Freq: Once | INTRAVENOUS | Status: AC
  Administered 2022-09-30: 500 mL via INTRAVENOUS

## 2022-09-30 MED ORDER — ASPIRIN 81 MG PO CHEW
324.0000 mg | CHEWABLE_TABLET | Freq: Once | ORAL | Status: AC
  Administered 2022-09-30: 324 mg via ORAL
  Filled 2022-09-30: qty 4

## 2022-09-30 NOTE — ED Triage Notes (Signed)
Pt BIB EMS from home for CP that began after a mechanical fall on Tuesday. Pt family took pt to PCP today and xrays were negative. Pt was administered a meyers infusion for dehydration per EMS. Pt reports continung weakness and CP upon inspiration. Pt is on eliquis. Pt's fall was witnessed by wife on TUES - headstrike. CBG 430 EMS admin of LR IV LFA 18G

## 2022-09-30 NOTE — Progress Notes (Signed)
ANTICOAGULATION CONSULT NOTE  Pharmacy Consult for heparin infusion Indication: nSTEMI  Allergies  Allergen Reactions   Mirtazapine Other (See Comments)    Other reaction(s): Dizziness-per wife, Patient has been tolerating 7.5 mg without dizziness.    Patient Measurements: Height: 6\' 2"  (188 cm) Weight: 66.2 kg (146 lb) IBW/kg (Calculated) : 82.2 Heparin Dosing Weight: 66.2 kg  Vital Signs: Temp: 97 F (36.1 C) (09/20 1952) Temp Source: Axillary (09/20 1952) BP: 127/95 (09/20 2300) Pulse Rate: 97 (09/20 2300)  Labs: Recent Labs    10/07/2022 1952 09/16/2022 2225  HGB 12.3*  --   HCT 36.9*  --   PLT 266  --   LABPROT 23.2*  --   INR 2.0*  --   HEPARINUNFRC >1.10*  --   CREATININE 2.42*  --   TROPONINIHS 2,936* 2,918*    Estimated Creatinine Clearance: 22.8 mL/min (A) (by C-G formula based on SCr of 2.42 mg/dL (H)).   Medical History: Past Medical History:  Diagnosis Date   Acute blood loss anemia 03/07/2022   Acute respiratory failure due to COVID-19 [facility]) 09/20/2019   Anemia    pernicious anemia   Arrhythmia    patient unaware of diagnosis except heart skips beats   Cancer (HCC)    lip cancer   Complication of anesthesia    unable to void after surgery and had to stay overnight for several days w/ cath   Diabetes mellitus without complication (HCC)    Headache    History of 2019 novel coronavirus disease (COVID-19) 09/26/2019   Formatting of this note might be different from the original. 9/21   History of CVA (cerebrovascular accident) 10/03/2019   Formatting of this note might be different from the original. Right basal ganglia stroke post Covid, 9/21 Right corona radiata infarct, 7/23 Right lateral thalamus, 8/23   History of kidney stones    Hypertension    Kidney stone    Stroke [facility])    UGIB (upper gastrointestinal bleed) 03/07/2022    Medications:  PTA Meds: Eliquis 5 mg BID, last dose unknown.  Baseline HL > 1.1.  Assessment: Pt is a 82 yo  male with h/o A. Fib on Eliquis, presenting to ED for CP that began after a mechanical fall on Tuesday, resulting in head strike, found with elevated Troponin I level  Goal of Therapy:  Heparin level 0.3-0.7 units/ml aPTT 66-102 seconds Monitor platelets by anticoagulation protocol: Yes   Plan:  Will not give initial bolus Start heparin infusion at 900 units/hr Will follow aPTT until correlation w/ HL confirmed Will check aPTT in 8 hr after start of infusion HL & CBC daily while on heparin  Otelia Sergeant, PharmD, [facility] 09/21/2022 11:39 PM

## 2022-09-30 NOTE — ED Notes (Signed)
Troponin 2936, Dr. Scotty Court informed via face to face conversation. No new verbal orders received.

## 2022-09-30 NOTE — ED Provider Notes (Signed)
[facility] Provider Note    Event Date/Time   First MD Initiated Contact with Patient 09/11/2022 2011     (approximate)   History   Chief Complaint: Chest Pain   HPI  Kurt Hernandez is a 82 y.o. male with a history of hypertension, diabetes, multiple strokes who comes ED complaining of chest pain that began 3 days ago after a mechanical fall at home, tripping over some items on the porch.  Pain has been persistent since then, and family also note he has decreased energy.  Patient also endorses mild shortness of breath.  Patient does take Eliquis.     Physical Exam   Triage Vital Signs: ED Triage Vitals  Encounter Vitals Group     BP 09/24/2022 1943 110/87     Systolic BP Percentile --      Diastolic BP Percentile --      Pulse Rate 09/28/2022 1943 90     Resp 10/06/2022 1943 16     Temp 09/28/2022 1952 (!) 97 F (36.1 C)     Temp Source 09/20/2022 1952 Axillary     SpO2 09/17/2022 1943 95 %     Weight 09/18/2022 1946 146 lb (66.2 kg)     Height 09/18/2022 1946 6\' 2"  (1.88 m)     Head Circumference --      Peak Flow --      Pain Score 09/12/2022 1944 9     Pain Loc --      Pain Education --      Exclude from Growth Chart --     Most recent vital signs: Vitals:   09/16/2022 2355 10/01/22 0000  BP:  (!) 119/104  Pulse:  99  Resp:  (!) 22  Temp:    SpO2: 100% 100%    General: Awake, no distress.  CV:  Good peripheral perfusion.  Regular rate and rhythm Resp:  Normal effort.  Decreased breath sounds bilateral bases. Abd:  No distention.  Soft nontender Other:  Chest wall nontender and stable.  Hips and pelvis stable and nontender.  No deformities or long bone tenderness.  Full range of motion all extremities.   ED Results / Procedures / Treatments   Labs (all labs ordered are listed, but only abnormal results are displayed) Labs Reviewed  BASIC METABOLIC PANEL - Abnormal; Notable for the following components:      Result Value   Sodium 133 (*)     CO2 20 (*)    Glucose, Bld 380 (*)    BUN 54 (*)    Creatinine, Ser 2.42 (*)    GFR, Estimated 26 (*)    All other components within normal limits  CBC - Abnormal; Notable for the following components:   WBC 11.7 (*)    RBC 3.99 (*)    Hemoglobin 12.3 (*)    HCT 36.9 (*)    All other components within normal limits  PROTIME-INR - Abnormal; Notable for the following components:   Prothrombin Time 23.2 (*)    INR 2.0 (*)    All other components within normal limits  HEPARIN LEVEL (UNFRACTIONATED) - Abnormal; Notable for the following components:   Heparin Unfractionated >1.10 (*)    All other components within normal limits  TROPONIN I (HIGH SENSITIVITY) - Abnormal; Notable for the following components:   Troponin I (High Sensitivity) 2,936 (*)    All other components within normal limits  TROPONIN I (HIGH SENSITIVITY) - Abnormal; Notable for the following components:  Troponin I (High Sensitivity) 2,918 (*)    All other components within normal limits  URINALYSIS, W/ REFLEX TO CULTURE (INFECTION SUSPECTED)  APTT  APTT  LIPOPROTEIN A (LPA)     EKG Interpreted by me Sinus rhythm rate of 90.  Normal axis and intervals.  Poor R wave progression.  Normal ST segments and T waves.   RADIOLOGY Chest x-ray interpreted by me, shows bilateral pleural effusion, right greater than left.  No pneumothorax or lobar consolidation.  Radiology report reviewed  CT chest reveals pleural effusion, no hemothorax.  PROCEDURES:  .Critical Care  Performed by: Sharman Cheek, MD Authorized by: Sharman Cheek, MD   Critical care provider statement:    Critical care time (minutes):  35   Critical care time was exclusive of:  Separately billable procedures and treating other patients   Critical care was necessary to treat or prevent imminent or life-threatening deterioration of the following conditions:  Cardiac failure   Critical care was time spent personally by me on the following  activities:  Development of treatment plan with patient or surrogate, discussions with consultants, evaluation of patient's response to treatment, examination of patient, obtaining history from patient or surrogate, ordering and performing treatments and interventions, ordering and review of laboratory studies, ordering and review of radiographic studies, pulse oximetry, re-evaluation of patient's condition and review of old charts   Care discussed with: admitting provider      MEDICATIONS ORDERED IN ED: Medications  heparin ADULT infusion 100 units/mL (25000 units/265mL) (900 Units/hr Intravenous New Bag/Given 10/07/2022 2345)  pantoprazole (PROTONIX) EC tablet 40 mg (has no administration in time range)  levothyroxine (SYNTHROID) tablet 100 mcg (has no administration in time range)  insulin detemir (LEVEMIR) injection 12 Units (has no administration in time range)  atorvastatin (LIPITOR) tablet 80 mg (has no administration in time range)  amLODipine (NORVASC) tablet 5 mg (has no administration in time range)  aspirin EC tablet 81 mg (has no administration in time range)  nitroGLYCERIN (NITROSTAT) SL tablet 0.4 mg (has no administration in time range)  acetaminophen (TYLENOL) tablet 650 mg (has no administration in time range)  ondansetron (ZOFRAN) injection 4 mg (has no administration in time range)  0.9 %  sodium chloride infusion (has no administration in time range)  metoprolol tartrate (LOPRESSOR) tablet 12.5 mg (has no administration in time range)  traZODone (DESYREL) tablet 25 mg (has no administration in time range)  magnesium hydroxide (MILK OF MAGNESIA) suspension 30 mL (has no administration in time range)  sodium chloride 0.9 % bolus 500 mL (0 mLs Intravenous Stopped 09/24/2022 2223)  aspirin chewable tablet 324 mg (324 mg Oral Given 09/12/2022 2337)     IMPRESSION / MDM / ASSESSMENT AND PLAN / ED COURSE  I reviewed the triage vital signs and the nursing notes.  DDx: Hemothorax, rib  fracture, NSTEMI, pericardial effusion, CHF, AKI, electrolyte abnormality, anemia  Patient's presentation is most consistent with acute presentation with potential threat to life or bodily function.  Patient presents with chest pain for the past few days in the setting of a recent mechanical fall.  On exam, no signs of trauma, no focal tenderness.  CT negative for hemothorax, does confirm pleural effusions.  Labs reveal a troponin of 2900, creatinine slightly elevated from baseline CKD.  Doubt PE or dissection, presentation is consistent with NSTEMI.  Will start heparin and give aspirin.  Case discussed with hospitalist for further management.       FINAL CLINICAL IMPRESSION(S) / ED DIAGNOSES  Final diagnoses:  NSTEMI (non-ST elevated myocardial infarction) (HCC)     Rx / DC Orders   ED Discharge Orders     None        Note:  This document was prepared using Dragon voice recognition software and may include unintentional dictation errors.   Sharman Cheek, MD 10/01/22 [phone]

## 2022-09-30 NOTE — ED Notes (Signed)
Patient transported to CT 

## 2022-10-01 ENCOUNTER — Inpatient Hospital Stay: Payer: Medicare Other

## 2022-10-01 ENCOUNTER — Inpatient Hospital Stay
Admit: 2022-10-01 | Discharge: 2022-10-01 | Disposition: A | Discharge status: Home / Self Care | Payer: Medicare Other | Attending: Pulmonary Disease | Admitting: Pulmonary Disease

## 2022-10-01 DIAGNOSIS — I214 Non-ST elevation (NSTEMI) myocardial infarction: Secondary | ICD-10-CM | POA: Diagnosis not present

## 2022-10-01 DIAGNOSIS — N179 Acute kidney failure, unspecified: Secondary | ICD-10-CM

## 2022-10-01 DIAGNOSIS — E039 Hypothyroidism, unspecified: Secondary | ICD-10-CM

## 2022-10-01 DIAGNOSIS — Z515 Encounter for palliative care: Secondary | ICD-10-CM

## 2022-10-01 DIAGNOSIS — N400 Enlarged prostate without lower urinary tract symptoms: Secondary | ICD-10-CM | POA: Insufficient documentation

## 2022-10-01 DIAGNOSIS — E785 Hyperlipidemia, unspecified: Secondary | ICD-10-CM | POA: Insufficient documentation

## 2022-10-01 DIAGNOSIS — K219 Gastro-esophageal reflux disease without esophagitis: Secondary | ICD-10-CM

## 2022-10-01 DIAGNOSIS — E1122 Type 2 diabetes mellitus with diabetic chronic kidney disease: Secondary | ICD-10-CM | POA: Diagnosis not present

## 2022-10-01 DIAGNOSIS — N1832 Chronic kidney disease, stage 3b: Secondary | ICD-10-CM

## 2022-10-01 DIAGNOSIS — N189 Chronic kidney disease, unspecified: Secondary | ICD-10-CM

## 2022-10-01 DIAGNOSIS — I469 Cardiac arrest, cause unspecified: Secondary | ICD-10-CM | POA: Diagnosis not present

## 2022-10-01 LAB — RESPIRATORY PANEL BY PCR

## 2022-10-01 LAB — COMPREHENSIVE METABOLIC PANEL
ALT: 36 U/L (ref 0–44)
ALT: 84 U/L — ABNORMAL HIGH (ref 0–44)
AST: 36 U/L (ref 15–41)
AST: 129 U/L — ABNORMAL HIGH (ref 15–41)
Albumin: 2.7 g/dL — ABNORMAL LOW (ref 3.5–5.0)
Albumin: 2.4 g/dL — ABNORMAL LOW (ref 3.5–5.0)
Alkaline Phosphatase: 183 U/L — ABNORMAL HIGH (ref 38–126)
Alkaline Phosphatase: 199 U/L — ABNORMAL HIGH (ref 38–126)
Anion gap: 14 (ref 5–15)
Anion gap: 11 (ref 5–15)
BUN: 52 mg/dL — ABNORMAL HIGH (ref 8–23)
BUN: 54 mg/dL — ABNORMAL HIGH (ref 8–23)
CO2: 16 mmol/L — ABNORMAL LOW (ref 22–32)
CO2: 20 mmol/L — ABNORMAL LOW (ref 22–32)
Calcium: 8.6 mg/dL — ABNORMAL LOW (ref 8.9–10.3)
Calcium: 8.4 mg/dL — ABNORMAL LOW (ref 8.9–10.3)
Chloride: 102 mmol/L (ref 98–111)
Chloride: 103 mmol/L (ref 98–111)
Creatinine, Ser: 2.61 mg/dL — ABNORMAL HIGH (ref 0.61–1.24)
Creatinine, Ser: 2.45 mg/dL — ABNORMAL HIGH (ref 0.61–1.24)
GFR, Estimated: 24 mL/min — ABNORMAL LOW (ref >60)
GFR, Estimated: 26 mL/min — ABNORMAL LOW (ref >60)
Glucose, Bld: 384 mg/dL — ABNORMAL HIGH (ref 70–99)
Glucose, Bld: 285 mg/dL — ABNORMAL HIGH (ref 70–99)
Potassium: 3.5 mmol/L (ref 3.5–5.1)
Potassium: 3.4 mmol/L — ABNORMAL LOW (ref 3.5–5.1)
Sodium: 132 mmol/L — ABNORMAL LOW (ref 135–145)
Sodium: 134 mmol/L — ABNORMAL LOW (ref 135–145)
Total Bilirubin: 1.1 mg/dL (ref 0.3–1.2)
Total Bilirubin: 1.7 mg/dL — ABNORMAL HIGH (ref 0.3–1.2)
Total Protein: 5.9 g/dL — ABNORMAL LOW (ref 6.5–8.1)
Total Protein: 4.9 g/dL — ABNORMAL LOW (ref 6.5–8.1)

## 2022-10-01 LAB — BASIC METABOLIC PANEL
Anion gap: 9 (ref 5–15)
Anion gap: 10 (ref 5–15)
BUN: 54 mg/dL — ABNORMAL HIGH (ref 8–23)
BUN: 57 mg/dL — ABNORMAL HIGH (ref 8–23)
CO2: 21 mmol/L — ABNORMAL LOW (ref 22–32)
CO2: 22 mmol/L (ref 22–32)
Calcium: 8.6 mg/dL — ABNORMAL LOW (ref 8.9–10.3)
Calcium: 8.4 mg/dL — ABNORMAL LOW (ref 8.9–10.3)
Chloride: 104 mmol/L (ref 98–111)
Chloride: 105 mmol/L (ref 98–111)
Creatinine, Ser: 2.5 mg/dL — ABNORMAL HIGH (ref 0.61–1.24)
Creatinine, Ser: 2.57 mg/dL — ABNORMAL HIGH (ref 0.61–1.24)
GFR, Estimated: 25 mL/min — ABNORMAL LOW (ref >60)
GFR, Estimated: 25 mL/min — ABNORMAL LOW (ref >60)
Glucose, Bld: 276 mg/dL — ABNORMAL HIGH (ref 70–99)
Glucose, Bld: 147 mg/dL — ABNORMAL HIGH (ref 70–99)
Potassium: 3.4 mmol/L — ABNORMAL LOW (ref 3.5–5.1)
Potassium: 4 mmol/L (ref 3.5–5.1)
Sodium: 134 mmol/L — ABNORMAL LOW (ref 135–145)
Sodium: 137 mmol/L (ref 135–145)

## 2022-10-01 LAB — URINE DRUG SCREEN, QUALITATIVE (ARMC ONLY)
Amphetamines, Ur Screen: NOT DETECTED
Barbiturates, Ur Screen: NOT DETECTED
Benzodiazepine, Ur Scrn: NOT DETECTED
Cannabinoid 50 Ng, Ur ~~LOC~~: NOT DETECTED
Cocaine Metabolite,Ur ~~LOC~~: NOT DETECTED
MDMA (Ecstasy)Ur Screen: NOT DETECTED
Methadone Scn, Ur: NOT DETECTED
Opiate, Ur Screen: NOT DETECTED
Phencyclidine (PCP) Ur S: NOT DETECTED
Tricyclic, Ur Screen: NOT DETECTED

## 2022-10-01 LAB — CBC WITH DIFFERENTIAL/PLATELET
Abs Immature Granulocytes: 0.04 10*3/uL (ref 0.00–0.07)
Basophils Absolute: 0 10*3/uL (ref 0.0–0.1)
Basophils Relative: 0 %
Eosinophils Absolute: 0.1 10*3/uL (ref 0.0–0.5)
Eosinophils Relative: 1 %
HCT: 36.6 % — ABNORMAL LOW (ref 39.0–52.0)
Hemoglobin: 12.1 g/dL — ABNORMAL LOW (ref 13.0–17.0)
Immature Granulocytes: 0 %
Lymphocytes Relative: 21 %
Lymphs Abs: 2.2 10*3/uL (ref 0.7–4.0)
MCH: 30.9 pg (ref 26.0–34.0)
MCHC: 33.1 g/dL (ref 30.0–36.0)
MCV: 93.4 fL (ref 80.0–100.0)
Monocytes Absolute: 0.8 10*3/uL (ref 0.1–1.0)
Monocytes Relative: 7 %
Neutro Abs: 7.3 10*3/uL (ref 1.7–7.7)
Neutrophils Relative %: 71 %
Platelets: 269 10*3/uL (ref 150–400)
RBC: 3.92 MIL/uL — ABNORMAL LOW (ref 4.22–5.81)
RDW: 13.8 % (ref 11.5–15.5)
WBC: 10.4 10*3/uL (ref 4.0–10.5)
nRBC: 0 % (ref 0.0–0.2)

## 2022-10-01 LAB — URINALYSIS, W/ REFLEX TO CULTURE (INFECTION SUSPECTED)
Bacteria, UA: NONE SEEN
Bilirubin Urine: NEGATIVE
Glucose, UA: 50 mg/dL — AB
Hgb urine dipstick: NEGATIVE
Ketones, ur: NEGATIVE mg/dL
Leukocytes,Ua: NEGATIVE
Nitrite: NEGATIVE
Protein, ur: >=300 mg/dL — AB
Specific Gravity, Urine: 1.019 (ref 1.005–1.030)
pH: 5 (ref 5.0–8.0)

## 2022-10-01 LAB — MRSA NEXT GEN BY PCR, NASAL: MRSA by PCR Next Gen: NOT DETECTED

## 2022-10-01 LAB — COOXEMETRY PANEL
Carboxyhemoglobin: 0.7 % (ref 0.5–1.5)
Methemoglobin: <0.7 % (ref 0.0–1.5)
O2 Saturation: 42.9 %
Total hemoglobin: 11.7 g/dL — ABNORMAL LOW (ref 12.0–16.0)
Total oxygen content: 42.6 %

## 2022-10-01 LAB — ECHOCARDIOGRAM COMPLETE
Area-P 1/2: 5.27 cm2
Est EF: <20
Height: 74 in
S' Lateral: 5.1 cm
Single Plane A4C EF: 23.7 %
Weight: 2232.82 oz

## 2022-10-01 LAB — APTT
aPTT: 49 seconds — ABNORMAL HIGH (ref 24–36)
aPTT: 57 seconds — ABNORMAL HIGH (ref 24–36)
aPTT: 177 seconds (ref 24–36)
aPTT: >200 seconds (ref 24–36)

## 2022-10-01 LAB — BLOOD GAS, ARTERIAL
Acid-base deficit: 7 mmol/L — ABNORMAL HIGH (ref 0.0–2.0)
Bicarbonate: 19.2 mmol/L — ABNORMAL LOW (ref 20.0–28.0)
FIO2: 0.4 %
MECHVT: 440 mL
Mechanical Rate: 20
O2 Saturation: 99.6 %
PEEP: 8 cmH2O
Patient temperature: 37
RATE: 20 resp/min
pCO2 arterial: 40 mmHg (ref 32–48)
pH, Arterial: 7.29 — ABNORMAL LOW (ref 7.35–7.45)
pO2, Arterial: 128 mmHg — ABNORMAL HIGH (ref 83–108)

## 2022-10-01 LAB — LACTIC ACID, PLASMA
Lactic Acid, Venous: 8.4 mmol/L (ref 0.5–1.9)
Lactic Acid, Venous: 4.3 mmol/L (ref 0.5–1.9)
Lactic Acid, Venous: 2.9 mmol/L (ref 0.5–1.9)
Lactic Acid, Venous: 1.6 mmol/L (ref 0.5–1.9)

## 2022-10-01 LAB — BRAIN NATRIURETIC PEPTIDE: B Natriuretic Peptide: 3137.5 pg/mL — ABNORMAL HIGH (ref 0.0–100.0)

## 2022-10-01 LAB — GLUCOSE, CAPILLARY
Glucose-Capillary: 348 mg/dL — ABNORMAL HIGH (ref 70–99)
Glucose-Capillary: 331 mg/dL — ABNORMAL HIGH (ref 70–99)
Glucose-Capillary: 325 mg/dL — ABNORMAL HIGH (ref 70–99)
Glucose-Capillary: 281 mg/dL — ABNORMAL HIGH (ref 70–99)
Glucose-Capillary: 305 mg/dL — ABNORMAL HIGH (ref 70–99)
Glucose-Capillary: 194 mg/dL — ABNORMAL HIGH (ref 70–99)
Glucose-Capillary: 176 mg/dL — ABNORMAL HIGH (ref 70–99)

## 2022-10-01 LAB — TROPONIN I (HIGH SENSITIVITY)
Troponin I (High Sensitivity): 3098 ng/L (ref <18)
Troponin I (High Sensitivity): 3155 ng/L (ref <18)
Troponin I (High Sensitivity): 3011 ng/L (ref <18)

## 2022-10-01 LAB — SARS CORONAVIRUS 2 BY RT PCR: SARS Coronavirus 2 by RT PCR: NEGATIVE

## 2022-10-01 LAB — MAGNESIUM
Magnesium: 2.5 mg/dL — ABNORMAL HIGH (ref 1.7–2.4)
Magnesium: 2.9 mg/dL — ABNORMAL HIGH (ref 1.7–2.4)
Magnesium: 2.9 mg/dL — ABNORMAL HIGH (ref 1.7–2.4)

## 2022-10-01 LAB — BETA-HYDROXYBUTYRIC ACID: Beta-Hydroxybutyric Acid: 0.31 mmol/L — ABNORMAL HIGH (ref 0.05–0.27)

## 2022-10-01 LAB — BLOOD GAS, VENOUS

## 2022-10-01 MED ORDER — PANTOPRAZOLE SODIUM 40 MG PO TBEC: 40.0000 mg | DELAYED_RELEASE_TABLET | Freq: Every day | ORAL | Status: DC

## 2022-10-01 MED ORDER — MAGNESIUM HYDROXIDE 400 MG/5ML PO SUSP: 30.0000 mL | Freq: Every day | ORAL | Status: DC | PRN

## 2022-10-01 MED ORDER — FENTANYL CITRATE PF 50 MCG/ML IJ SOSY: 25.0000 ug | PREFILLED_SYRINGE | INTRAMUSCULAR | Status: DC | PRN

## 2022-10-01 MED ORDER — TRAZODONE HCL 50 MG PO TABS: 25.0000 mg | ORAL_TABLET | Freq: Every evening | ORAL | Status: DC | PRN

## 2022-10-01 MED ORDER — LEVOTHYROXINE SODIUM 100 MCG PO TABS: 100.0000 ug | ORAL_TABLET | Freq: Every day | ORAL | Status: DC

## 2022-10-01 MED ORDER — SODIUM CHLORIDE 0.9 % IV SOLN: 250.0000 mL | INTRAVENOUS | Status: DC

## 2022-10-01 MED ORDER — GUAIFENESIN ER 600 MG PO TB12: 600.0000 mg | ORAL_TABLET | Freq: Two times a day (BID) | ORAL | Status: DC

## 2022-10-01 MED ORDER — HEPARIN (PORCINE) 25000 UT/250ML-% IV SOLN: 900.0000 [IU]/h | INTRAVENOUS | Status: DC

## 2022-10-01 MED ORDER — FENTANYL 2500MCG IN NS 250ML (10MCG/ML) PREMIX INFUSION
0.0000 ug/h | INTRAVENOUS | Status: DC
  Administered 2022-10-01: 25 ug/h via INTRAVENOUS
  Filled 2022-10-01: qty 250

## 2022-10-01 MED ORDER — ORAL CARE MOUTH RINSE
15.0000 mL | OROMUCOSAL | Status: DC
  Administered 2022-10-01 – 2022-10-02 (×9): 15 mL via OROMUCOSAL

## 2022-10-01 MED ORDER — ASPIRIN 81 MG PO TBEC
81.0000 mg | DELAYED_RELEASE_TABLET | Freq: Every day | ORAL | Status: DC
  Administered 2022-10-02: 81 mg via ORAL
  Filled 2022-10-01: qty 1

## 2022-10-01 MED ORDER — NOREPINEPHRINE 4 MG/250ML-% IV SOLN: 0.0000 ug/min | INTRAVENOUS | Status: DC

## 2022-10-01 MED ORDER — MELATONIN 5 MG PO TABS: 5.0000 mg | ORAL_TABLET | Freq: Once | ORAL | Status: DC

## 2022-10-01 MED ORDER — ROCURONIUM BROMIDE 10 MG/ML (PF) SYRINGE
PREFILLED_SYRINGE | INTRAVENOUS | Status: AC
  Filled 2022-10-01: qty 10

## 2022-10-01 MED ORDER — ETOMIDATE 2 MG/ML IV SOLN
INTRAVENOUS | Status: AC
  Filled 2022-10-01: qty 10

## 2022-10-01 MED ORDER — SODIUM CHLORIDE 0.9 % IV SOLN
3.0000 g | Freq: Two times a day (BID) | INTRAVENOUS | Status: DC
  Filled 2022-10-01: qty 8

## 2022-10-01 MED ORDER — ORAL CARE MOUTH RINSE: 15.0000 mL | OROMUCOSAL | Status: DC | PRN

## 2022-10-01 MED ORDER — SODIUM CHLORIDE 0.9 % IV BOLUS
500.0000 mL | Freq: Once | INTRAVENOUS | Status: AC
  Administered 2022-10-01: 500 mL via INTRAVENOUS

## 2022-10-01 MED ORDER — NITROGLYCERIN 0.4 MG SL SUBL: 0.4000 mg | SUBLINGUAL_TABLET | SUBLINGUAL | Status: DC | PRN

## 2022-10-01 MED ORDER — MIDAZOLAM HCL 2 MG/2ML IJ SOLN
2.0000 mg | INTRAMUSCULAR | Status: DC | PRN
  Administered 2022-10-01 – 2022-10-02 (×3): 2 mg via INTRAVENOUS
  Filled 2022-10-01 (×3): qty 2

## 2022-10-01 MED ORDER — METOPROLOL TARTRATE 25 MG PO TABS
12.5000 mg | ORAL_TABLET | Freq: Two times a day (BID) | ORAL | Status: DC
  Administered 2022-10-01: 12.5 mg via ORAL
  Filled 2022-10-01: qty 1

## 2022-10-01 MED ORDER — VANCOMYCIN HCL 1500 MG/300ML IV SOLN
1500.0000 mg | Freq: Once | INTRAVENOUS | Status: AC
  Administered 2022-10-01: 1500 mg via INTRAVENOUS
  Filled 2022-10-01: qty 300

## 2022-10-01 MED ORDER — PANTOPRAZOLE SODIUM 40 MG IV SOLR
40.0000 mg | Freq: Every day | INTRAVENOUS | Status: DC
  Administered 2022-10-01 – 2022-10-02 (×2): 40 mg via INTRAVENOUS
  Filled 2022-10-01 (×2): qty 10

## 2022-10-01 MED ORDER — INSULIN DETEMIR 100 UNIT/ML ~~LOC~~ SOLN
12.0000 [IU] | Freq: Every morning | SUBCUTANEOUS | Status: DC
  Filled 2022-10-01: qty 0.12

## 2022-10-01 MED ORDER — NOREPINEPHRINE 4 MG/250ML-% IV SOLN
2.0000 ug/min | INTRAVENOUS | Status: DC
  Administered 2022-10-01: 10 ug/min via INTRAVENOUS
  Administered 2022-10-01: 20 ug/min via INTRAVENOUS
  Filled 2022-10-01 (×3): qty 250

## 2022-10-01 MED ORDER — SODIUM CHLORIDE 0.9 % IV SOLN: INTRAVENOUS | Status: DC

## 2022-10-01 MED ORDER — POTASSIUM CHLORIDE 20 MEQ PO PACK
40.0000 meq | PACK | Freq: Once | ORAL | Status: AC
  Administered 2022-10-01: 40 meq via ORAL
  Filled 2022-10-01: qty 2

## 2022-10-01 MED ORDER — ATORVASTATIN CALCIUM 20 MG PO TABS
80.0000 mg | ORAL_TABLET | Freq: Every day | ORAL | Status: DC
  Administered 2022-10-01 (×2): 80 mg via ORAL
  Filled 2022-10-01: qty 4
  Filled 2022-10-01: qty 1

## 2022-10-01 MED ORDER — SODIUM CHLORIDE 0.9 % IV SOLN
2.0000 g | INTRAVENOUS | Status: DC
  Administered 2022-10-01: 2 g via INTRAVENOUS
  Filled 2022-10-01: qty 12.5

## 2022-10-01 MED ORDER — ASPIRIN 300 MG RE SUPP: 300.0000 mg | RECTAL | Status: DC

## 2022-10-01 MED ORDER — CHLORHEXIDINE GLUCONATE CLOTH 2 % EX PADS
6.0000 | MEDICATED_PAD | Freq: Every day | CUTANEOUS | Status: DC
  Administered 2022-10-01: 6 via TOPICAL

## 2022-10-01 MED ORDER — GUAIFENESIN 100 MG/5ML PO LIQD
30.0000 mL | Freq: Two times a day (BID) | ORAL | Status: DC
  Administered 2022-10-01 – 2022-10-02 (×2): 30 mL via ORAL
  Filled 2022-10-01 (×2): qty 30

## 2022-10-01 MED ORDER — INSULIN ASPART 100 UNIT/ML IJ SOLN
10.0000 [IU] | Freq: Once | INTRAMUSCULAR | Status: AC
  Administered 2022-10-01: 10 [IU] via SUBCUTANEOUS
  Filled 2022-10-01: qty 1

## 2022-10-01 MED ORDER — PROPOFOL 1000 MG/100ML IV EMUL: 0.0000 ug/kg/min | INTRAVENOUS | Status: DC

## 2022-10-01 MED ORDER — MORPHINE SULFATE (PF) 2 MG/ML IV SOLN: 2.0000 mg | INTRAVENOUS | Status: DC | PRN

## 2022-10-01 MED ORDER — ONDANSETRON HCL 4 MG/2ML IJ SOLN
4.0000 mg | Freq: Four times a day (QID) | INTRAMUSCULAR | Status: DC | PRN
  Filled 2022-10-01: qty 2

## 2022-10-01 MED ORDER — AMLODIPINE BESYLATE 5 MG PO TABS: 2.5000 mg | ORAL_TABLET | Freq: Every day | ORAL | Status: DC

## 2022-10-01 MED ORDER — HEPARIN BOLUS VIA INFUSION
1000.0000 [IU] | Freq: Once | INTRAVENOUS | Status: AC
  Administered 2022-10-01: 1000 [IU] via INTRAVENOUS
  Filled 2022-10-01: qty 1000

## 2022-10-01 MED ORDER — SODIUM CHLORIDE 0.9 % IV SOLN
12.5000 mg | Freq: Once | INTRAVENOUS | Status: DC
  Filled 2022-10-01: qty 0.5

## 2022-10-01 MED ORDER — PERFLUTREN LIPID MICROSPHERE
1.0000 mL | INTRAVENOUS | Status: AC | PRN
  Administered 2022-10-01: 2 mL via INTRAVENOUS

## 2022-10-01 MED ORDER — SODIUM BICARBONATE 8.4 % IV SOLN
50.0000 meq | Freq: Once | INTRAVENOUS | Status: AC
  Administered 2022-10-01: 50 meq via INTRAVENOUS
  Filled 2022-10-01: qty 50

## 2022-10-01 MED ORDER — ASPIRIN 81 MG PO CHEW: 324.0000 mg | CHEWABLE_TABLET | ORAL | Status: DC

## 2022-10-01 MED ORDER — ACETAMINOPHEN 325 MG PO TABS: 650.0000 mg | ORAL_TABLET | ORAL | Status: DC | PRN

## 2022-10-01 MED ORDER — NOREPINEPHRINE 16 MG/250ML-% IV SOLN
0.0000 ug/min | INTRAVENOUS | Status: DC
  Administered 2022-10-01: 15 ug/min via INTRAVENOUS
  Administered 2022-10-01: 33 ug/min via INTRAVENOUS
  Administered 2022-10-02: 40 ug/min via INTRAVENOUS
  Filled 2022-10-01 (×5): qty 250

## 2022-10-01 MED ORDER — AMLODIPINE BESYLATE 5 MG PO TABS: 5.0000 mg | ORAL_TABLET | Freq: Every day | ORAL | Status: DC

## 2022-10-01 MED ORDER — INSULIN ASPART 100 UNIT/ML IJ SOLN: 0.0000 [IU] | INTRAMUSCULAR | Status: DC

## 2022-10-01 MED ORDER — INSULIN ASPART 100 UNIT/ML IJ SOLN
0.0000 [IU] | INTRAMUSCULAR | Status: DC
  Administered 2022-10-01: 8 [IU] via SUBCUTANEOUS
  Administered 2022-10-01: 11 [IU] via SUBCUTANEOUS
  Administered 2022-10-01 (×2): 3 [IU] via SUBCUTANEOUS
  Administered 2022-10-02: 5 [IU] via SUBCUTANEOUS
  Filled 2022-10-01 (×5): qty 1

## 2022-10-01 MED ORDER — EPINEPHRINE HCL 5 MG/250ML IV SOLN IN NS
0.5000 ug/min | INTRAVENOUS | Status: DC
  Administered 2022-10-01: 0.5 ug/min via INTRAVENOUS
  Filled 2022-10-01: qty 250

## 2022-10-01 NOTE — Assessment & Plan Note (Signed)
-   The patient be hydrated with IV normal saline and will follow BMP. - Will avoid nephrotoxins.

## 2022-10-01 NOTE — Assessment & Plan Note (Signed)
-  The patient will be placed on supplement coverage with NovoLog. ?- We will continue basal coverage. ? ?

## 2022-10-01 NOTE — Progress Notes (Signed)
   10/01/22 0600  Spiritual Encounters  Type of Visit Initial  Care provided to: Patient;Family  Referral source Nurse (RN/NT/LPN)  Reason for visit Code  OnCall Visit Yes  Spiritual Framework  Presenting Themes Meaning/purpose/sources of inspiration;Significant life change  Patient Stress Factors Not reviewed  Family Stress Factors Major life changes;Health changes  Interventions  Spiritual Care Interventions Made Established relationship of care and support;Compassionate presence;Reflective listening;Prayer  Intervention Outcomes  Outcomes Awareness of support  Spiritual Care Plan  Spiritual Care Issues Still Outstanding Chaplain will continue to follow   Received phone call from ICU PT  coded and family was stressing. Spoke with the family and provided prayer upon family's request. Prayer over patient spend sometime outside of his room as staff provided care. Let family know I be checking back and forth with them. Let the staff know to call me if any changes happens when I am not around.

## 2022-10-01 NOTE — Procedures (Signed)
Intubation Procedure Note  Kurt Hernandez  [id number]  1940/10/15  Date:10/01/22  Time:6:37 AM   Provider Performing:Mahonri Seiden A Alithia Zavaleta   Procedure: Intubation (31500)  Indication(s) Respiratory Failure  Consent  Anesthesia Etomidate and Rocuronium  Time Out Verified patient identification, verified procedure, site/side was marked, verified correct patient position, special equipment/implants available, medications/allergies/relevant history reviewed, required imaging and test results available.  Sterile Technique Usual hand hygeine, masks, and gloves were used  Procedure Description Patient positioned in bed supine.  Sedation given as noted above.  Patient was intubated with endotracheal tube using Glidescope.  View was Grade 1 full glottis .  Number of attempts was 1.  Colorimetric CO2 detector was consistent with tracheal placement.  Complications/Tolerance None; patient tolerated the procedure well. Chest X-ray is ordered to verify placement.  EBL Minimal  Specimen(s) None   Webb Silversmith, DNP, CCRN, FNP-C, AGACNP-BC Acute Care & Family Nurse Practitioner  [facility]  See Amion for personal pager PCCM on call pager [phone] until 7 am

## 2022-10-01 NOTE — Progress Notes (Signed)
CROSS COVER NOTE  NAME: Kurt Hernandez MRN: [id number] DOB : 1940/11/10    Concern as stated by nurse / staff   Paged regarding patient complaint of chest discomfort, shortness of breath and bigeminal rhythm     Pertinent findings on chart review: Patient admitted with NSTEMI, Troponin tren 2936 to 2918  Assessment and  Interventions   Assessment: Bedside patient anxious reports chest pressure, SB on monitor, and appears mottled Patient with vomiting episode, improvement in heart rate and BP still mottled Acidotic resp pattern and MAP 60  Plan EKG reviewed by me SR with PACs No STE 500 NS bolus Dg chest xray Abd xray Stat labs CMP, BNP, Troponin, CBC Covid and PCR resp panel Additional 500 NS bolus  Vbg DDX broad includes vagal response, symptomatic bradycardia , sepsis , cardiac event, Transfer to SD/ICU due borderline blood pressures discussed with ICU charge nurse 1 amp bicarb Consult PCCM Upon transfer to ICU patient became obtunded PCCM NP at bedside - intubation Wife called informed of transfer and intubation + code blue Family updated upon arrival to ICU      Donnie Mesa NP [facility] Cross Cover 7pm-7am - check amion for availability Pager [phone]

## 2022-10-01 NOTE — Assessment & Plan Note (Signed)
-   We will continue statin therapy. 

## 2022-10-01 NOTE — Procedures (Signed)
Patient Name: Kurt Hernandez   MRN: [id number]   Date of Birth/ Sex: Nov 04, 1940 , male      Admission Date: 10-26-2022  Attending Provider: Janann Colonel, MD  Primary Diagnosis: NSTEMI (non-ST elevated myocardial infarction) [facility])    PROCEDURE: U/S Guided Internal jugular central venous catheter   INDICATION (S): Medication administration and Difficult access  PROCEDURE OPERATOR: Webb Silversmith  CONSENT: Unable to obtain consent due to emergent nature of procedure.  PROCEDURE SUMMARY: A time-out was performed. The patient's <RIGHT> neck region was prepped and draped in sterile fashion using chlorhexidine scrub. Anesthesia was achieved with 1% lidocaine. The <RIGHT> internal jugular vein was accessed under ultrasound guidance using a finder needle and sheath. U/S images were permanently documented. Venous blood was withdrawn and the sheath was advanced into the vein and the needle was withdrawn. A guidewire was advanced through the sheath. A small incision was made with a 10-blade scalpel and the sheath was exchanged for a dilator over the guidewire until appropriate dilation was obtained. The dilator was removed and an 8.5 [place] central venous quad-lumen catheter was advanced over the guidewire and secured into place with 4 sutures at <_20_> cm. At time of procedure completion, all ports aspirated and flushed properly. Post-procedure x-ray shows the tip of the catheter within the superior vena cava.  COMPLICATIONS: None; patient tolerated the procedure well. Chest X-ray is ordered to verify placement for internal jugular or subclavian cannulation.   Chest x-ray is not ordered for femoral cannulation.  ESTIMATED BLOOD LOSS: none   Webb Silversmith, DNP, CCRN, FNP-C, AGACNP-BC Acute Care & Family Nurse Practitioner  [facility]  See Amion for personal pager PCCM on call pager [phone] until 7 am

## 2022-10-01 NOTE — Consult Note (Signed)
Pharmacy Antibiotic Note  Kurt Hernandez is a 82 y.o. male admitted on 09/28/2022 with  syncope at home .  Workup consistent with NSTEMI but CCMD has concern for sepsis with possible pneumonia. Pharmacy has been consulted for Vancomycin and Cefepime dosing.  Plan: Vancomycin 1500mg  IV x 1 ordered as loading dose. Due to AKI, will defer scheduled dosing and use random levels to help assess dosing regimen. Will re-assess in AM on 9/22  Cefepime 2g Q24h per renal function  Height: 6\' 2"  (188 cm) Weight: 63.3 kg (139 lb 8.8 oz) IBW/kg (Calculated) : 82.2  Temp (24hrs), Avg:97.2 F (36.2 C), Min:97 F (36.1 C), Max:97.6 F (36.4 C)  Recent Labs  Lab 10/06/2022 1952 10/01/22 0416 10/01/22 0418 10/01/22 0604  WBC 11.7* 10.4  --   --   CREATININE 2.42*  --  2.61*  --   LATICACIDVEN  --   --   --  8.4*    Estimated Creatinine Clearance: 20.2 mL/min (A) (by C-G formula based on SCr of 2.61 mg/dL (H)).    Allergies  Allergen Reactions   Mirtazapine Other (See Comments)    Other reaction(s): Dizziness-per wife, Patient has been tolerating 7.5 mg without dizziness.    Antimicrobials this admission: Vancomycin 9/21 >>  Cefepime 9/21 >>   Dose adjustments this admission: N/A  Microbiology results: 9/21 BCx: collected 9/21 MRSA PCR: pending  Thank you for allowing pharmacy to be a part of this patient's care.  Bettey Costa 10/01/2022 8:32 AM

## 2022-10-01 NOTE — Progress Notes (Signed)
   10/01/22 0300 10/01/22 0305 10/01/22 0310  Assess: MEWS Score  ECG Heart Rate (!) 114 (!) 116 (!) 110  Resp (!) 22 14 (!) 29  Level of Consciousness Alert  --   --   Assess: MEWS Score  MEWS Temp 0 0 0  MEWS Systolic 0 0 0  MEWS Pulse 2 2 1   MEWS RR 1 0 2  MEWS LOC 0 0 0  MEWS Score 3 2 3   MEWS Score Color Yellow Yellow Yellow  Assess: if the MEWS score is Yellow or Red  Were vital signs accurate and taken at a resting state? Yes  --   --   Does the patient meet 2 or more of the SIRS criteria? No  --   --   MEWS guidelines implemented  Yes, yellow  --   --   Treat  MEWS Interventions Considered administering scheduled or prn medications/treatments as ordered  --   --   Take Vital Signs  Increase Vital Sign Frequency  Yellow: Q2hr x1, continue Q4hrs until patient remains green for 12hrs  --   --   Escalate  MEWS: Escalate Yellow: Discuss with charge nurse and consider notifying provider and/or RRT  --   --   Notify: Charge Nurse/RN  Name of Charge Nurse/RN Notified [place], RN  --   --   Provider Notification  Provider Name/Title  --   --  Manuela Schwartz, NP  Date Provider Notified  --   --  10/01/22  Time Provider Notified  --   --  0175  Method of Notification  --   --  Call  Notification Reason  --   --  Change in status;New onset of dysrhythmia  Provider response  --   --  En route  Date of Provider Response  --   --  10/01/22  Time of Provider Response  --   --  [phone]  Assess: SIRS CRITERIA  SIRS Temperature  0 0 0  SIRS Pulse 1 1 1   SIRS Respirations  1 0 1  SIRS WBC 0 0 0  SIRS Score Sum  2 1 2    0310-0500: Pt in Ventricular bigeminy, after previously being in new onset Afib, Pt c/o chest pain, SOB. Pt noted to be pale, but is Aox4. EKG performed twice with varying results. Manuela Schwartz, NP called and informed of change in status. Steward Drone to bedside immediately. BP dropping and boluses ordered, increased respirations with increases of O2. Pt had large emesis episode  front forward down his chin. After episode, mouth suctioned. Pt stated he felt better. Intermittent chest pain/SOB with resulting labs and CXR. BP continued to drop with intermittent drops in HR. Pt eventually transferred to ICU on non rebreather mask while becoming minimally responsive.

## 2022-10-01 NOTE — Progress Notes (Signed)
Elvina Sidle, NP called to bedside by RN and NP aware of bradycardia 50s and blood pressure dropping. NP stated he will order Epi drip. Levophed drip titrated up at this time.

## 2022-10-01 NOTE — Progress Notes (Addendum)
CODE BLUE NOTE  Patient Name: Kurt Hernandez   MRN: [id number]   Date of Birth/ Sex: [birth year] , male      Admission Date: 10/09/2022  Attending Provider: Janann Colonel, MD  Primary Diagnosis: NSTEMI (non-ST elevated myocardial infarction) [facility])   Cardiopulmonary Resuscitation Directed by: Webb Silversmith NP   I personally directed ancillary staff and/or performed CPR in an effort to regain return of spontaneous circulation. PCCM , and TRH MD present in room.  Indication: Pt was in his usual state of health until this morning, per nursing staff at around 0310-0500 am , pt went into  new onset A-fib.  He complained of chest pain and shortness of breath.  On assessment he was noted to be pale but still alert and oriented.  EKG obtained which showed ventricular bigeminy without signs of ischemia.  Patient's mental status continued to worsen and he became hemodynamically unstable with blood pressure documented as low as  systolic in the 50's.  He received IV fluid boluses with slight improvement in BP but mental status remained unchanged.  Due to increased work of breathing and increase in oxygenation requirement he was placed on oxygen.  Patient had episode of emesis with intermittent chest pain and shortness of breath.  Chest x-ray obtained and he was transferred to the ICU.  On arrival to the ICU patient was noted to be obtunded and minimally responsive.  He was pale appearing with bluish discoloration to extremities concerning for hypoperfusion/shock. Due to concerns of inability to protect his airway and increased work of breathing, patient was emergently intubated at the bedside.  Post intubation patient became bradycardia and went into PEA cardiac arrest.  CPR initiated as below.`    Technical Description:  - CPR performance duration:  10 minutes. Patient received 3 rounds of CPR  - Was defibrillation or cardioversion used? No   - Was external pacer placed? No  - Was patient  intubated pre/post CPR? Yes    Medications Administered: Y = Yes; Blank = No Amiodarone  no  Atropine  no  Calcium  x1  Epinephrine  x3  Lidocaine  no  Magnesium  2g  Norepinephrine  infusing  Phenylephrine  no  Sodium bicarbonate  x2  Vasopressin no  D50 no             Post CPR evaluation:  - Final Status - Was patient successfully resuscitated ? Yes - What is current rhythm?  Wide-complex tachycardia, patient received IV magnesium and additional sodium bicarb.  STEMI on-call notified who reviewed EKG for new right bundle branch block and determined did not meet STEMI criteria at.  Heparin restarted - What is current hemodynamic status?  Stable on Levophed   Miscellaneous Information:  - Labs sent, including: Troponins, lactate CMP CBC and mag  - Primary team notified?  PCCM took over care post intubation  - Family Notified? Yes, discussed with patient's wife and other family members who are currently in the waiting room     Full H&P to follow  Webb Silversmith, DNP, CCRN, FNP-C, AGACNP-BC Acute Care & Family Nurse Practitioner  [facility]  See Amion for personal pager PCCM on call pager [phone] until 7 am

## 2022-10-01 NOTE — Progress Notes (Signed)
ANTICOAGULATION CONSULT NOTE  Pharmacy Consult for heparin infusion Indication: nSTEMI  Allergies  Allergen Reactions   Mirtazapine Other (See Comments)    Other reaction(s): Dizziness-per wife, Patient has been tolerating 7.5 mg without dizziness.    Patient Measurements: Height: 6\' 2"  (188 cm) Weight: 63.3 kg (139 lb 8.8 oz) IBW/kg (Calculated) : 82.2 Heparin Dosing Weight: 66.2 kg  Vital Signs: Temp: 97 F (36.1 C) (09/21 0700) Temp Source: Oral (09/21 0700) BP: 128/101 (09/21 0700) Pulse Rate: 40 (09/21 0700)  Labs: Recent Labs    09/27/2022 1952 09/21/2022 2225 10/01/22 0057 10/01/22 0416 10/01/22 0418 10/01/22 0604  HGB 12.3*  --   --  12.1*  --   --   HCT 36.9*  --   --  36.6*  --   --   PLT 266  --   --  269  --   --   APTT  --   --  49*  --   --  57*  LABPROT 23.2*  --   --   --   --   --   INR 2.0*  --   --   --   --   --   HEPARINUNFRC >1.10*  --   --   --   --   --   CREATININE 2.42*  --   --   --  2.61*  --   TROPONINIHS 2,936* 2,918*  --   --  3,098*  --     Estimated Creatinine Clearance: 20.2 mL/min (A) (by C-G formula based on SCr of 2.61 mg/dL (H)).   Medical History: Past Medical History:  Diagnosis Date   Acute blood loss anemia 03/07/2022   Acute respiratory failure due to COVID-19 [facility]) 09/20/2019   Anemia    pernicious anemia   Arrhythmia    patient unaware of diagnosis except heart skips beats   Cancer (HCC)    lip cancer   Complication of anesthesia    unable to void after surgery and had to stay overnight for several days w/ cath   Diabetes mellitus without complication (HCC)    Headache    History of 2019 novel coronavirus disease (COVID-19) 09/26/2019   Formatting of this note might be different from the original. 9/21   History of CVA (cerebrovascular accident) 10/03/2019   Formatting of this note might be different from the original. Right basal ganglia stroke post Covid, 9/21 Right corona radiata infarct, 7/23 Right lateral  thalamus, 8/23   History of kidney stones    Hypertension    Kidney stone    Stroke [facility])    UGIB (upper gastrointestinal bleed) 03/07/2022    Medications:  PTA Meds: Eliquis 5 mg BID, last dose unknown.  Baseline HL > 1.1.  Assessment: Pt is a 82 yo male with h/o A. Fib on Eliquis, presenting to ED for CP that began after a mechanical fall on Tuesday, resulting in head strike, found with elevated Troponin I level  Goal of Therapy:  Heparin level 0.3-0.7 units/ml aPTT 66-102 seconds Monitor platelets by anticoagulation protocol: Yes    Date/Time: aPTT/HL: Rate: 9/21@0604  57/--  SUBtherapeutic@900  units/hr  Plan:  Bolus 1000 units x 1 Increase heparin infusion to 1000 units/hr Will follow aPTT until correlation w/ HL confirmed Will check aPTT in 8 hr after rate change HL & CBC daily while on heparin  Bettey Costa, PharmD Clinical Pharmacist 10/01/2022 7:25 AM

## 2022-10-01 NOTE — Procedures (Signed)
Arterial Catheter Insertion Procedure Note  TYLOR DUCHOW  [id number]  [birth year]  Date:10/01/22  Time:11:39 PM   Provider Performing: Loraine Leriche   Procedure: Insertion of Arterial Line (78295) without US guidance  Indication(s) Blood pressure monitoring and/or need for frequent ABGs  Consent Unable to obtain consent due to emergent nature of procedure.  Anesthesia None  Time Out Verified patient identification, verified procedure, site/side was marked, verified correct patient position, special equipment/implants available, medications/allergies/relevant history reviewed, required imaging and test results available.  Sterile Technique Maximal sterile technique including full sterile barrier drape, hand hygiene, sterile gown, sterile gloves, mask, hair covering, sterile ultrasound probe cover (if used).  Procedure Description Area of catheter insertion was cleaned with chlorhexidine and draped in sterile fashion. With real-time ultrasound guidance an arterial catheter was placed into the left radial artery.  Appropriate arterial tracings confirmed on monitor.    Complications/Tolerance None; patient tolerated the procedure well.  EBL Minimal  Specimen(s) None   Webb Silversmith, DNP, CCRN, FNP-C, AGACNP-BC Acute Care & Family Nurse Practitioner  [facility]  See Amion for personal pager PCCM on call pager [phone] until 7 am

## 2022-10-01 NOTE — Consult Note (Signed)
NAME:  Kurt Hernandez, MRN:  [id number], DOB:  1941-01-02, LOS: 1 ADMISSION DATE:  10-15-2022, CONSULTATION DATE:  10/01/2022 REFERRING MD:  Dr. Arville Care, CHIEF COMPLAINT:  PEA Cardiac Arrest, NSTEMI   Brief Pt Description / Synopsis:  82 y.o. male admitted with NSTEMI and AKI.  Hospital course complicated by PEA Cardiac Arrest (approximately 10 minutes of ACLS before ROSC), Acute Decompensated HFrEF with Cardiogenic shock, suspected Aspiration, and with concern for possible anoxic brain injury.  History of Present Illness:  Kurt Hernandez is a 82 y.o. male with medical history significant for hypertension, CVAs, urolithiasis, CVA, type 2 diabetes mellitus, and pernicious anemia, who presented to emergency room on 9/20024 with acute onset of midsternal chest pain felt as pressure and tightness.  Pt is currently intubated and unable to contribute to history, therefore history is obtained per chart review.  Per ED and nursing notes, about 3 days he had syncope with subsequent fall, along with intermittent chest pain.  Then on Oct 15, 2022 chest pain became persistent.  He also endorsed associated nausea, vomiting, diaphoresis, productive cough of yellow sputum.  He denies fever, chills, dysuria, hematuria, flank pain.  ED Course: Initial Vital Signs: Significant Labs:  mild hyponatremia and hyperglycemia of 380 with a CO2 of 20 and BUN 54 and creatinine 2.42 Compared to 24/1.97 on 07/01/2022.  Magnesium was 2.5 and high sensitive troponin RI was 2936 and later 2918.  CBC showed WBC of 11.7 with mild anemia.  INR was 2 PTT of 49 and PTT of 23.2.   WUJ:WJXBJY sinus rhythm with rate of 90 with Q waves anteroseptally and T wave inversion laterally.  Imaging: Chest X-ray: IMPRESSION: 1. Small bilateral pleural effusions, right greater than left. 2. Mild perihilar interstitial pulmonary infiltrate, possibly reflecting mild interstitial pulmonary edema or airway inflammation. CTA Chest: IMPRESSION: 1.  Moderate bilateral pleural effusions with compressive atelectasis of the dependent lungs bilaterally. Mild ascites within the visualized upper abdomen. Mild thickening of the peribronchovascular interstitium may reflect trace pulmonary edema and together these findings may relate to changes of cardiogenic failure. 2. Moderate multi-vessel coronary artery calcification. 3. Mild dilation of the thoracic aorta measuring up to 4.2 cm in diameter in its ascending segment. Recommend follow-up ultrasound every 12 months and vascular consultation. 4. Shotty mediastinal adenopathy, nonspecific and may be reactive or inflammatory in nature. Low-grade lymphoproliferative processes could appear similarly. In absence of interval prior examination, PET CT examination or follow-up CT imaging may be helpful in 3 months to document stability or resolution. 5. Nonobstructing right renal calculi. 6. 15 mm hypodense nodule within the right thyroid gland, not well characterized on this examination, but appears stable since prior examination. If indicated, depending on the patient's comorbidities, this could be better assessed with dedicated thyroid sonography CT Head w/o contrast>>IMPRESSION: 1. No acute intracranial abnormality. 2. 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 3. Scattered air within the deep soft tissues of the neck and venous structures of the cavernous sinuses. Please correlate clinically. Medications administered: 500 mL IV normal saline bolus, for review aspirin and started on IV heparin with bolus and drip.     Hospitalist were asked to admit to [facility] Cardiac unit for further workup and treatment.  Cardiology was consulted.   Hospital Course: Early in the morning on 10/01/22, per nursing staff at around 0310-0500 am , pt went into  new onset A-fib.  He complained of chest pain and shortness of breath.  On assessment he was noted to be pale but still  alert and oriented.  EKG obtained  which showed ventricular bigeminy without signs of ischemia.  Patient's mental status continued to worsen and he became hemodynamically unstable with blood pressure documented as low as  systolic in the 50's.  He received IV fluid boluses with slight improvement in BP but mental status remained unchanged.  Due to increased work of breathing and increase in oxygenation requirement he was placed on oxygen.  Patient had episode of emesis with intermittent chest pain and shortness of breath.  Chest x-ray obtained and he was transferred to the ICU.  On arrival to the ICU patient was noted to be obtunded and minimally responsive.  He was pale appearing with bluish discoloration to extremities concerning for hypoperfusion/shock. Due to concerns of inability to protect his airway and increased work of breathing, patient was emergently intubated at the bedside.  Post intubation patient became bradycardia and went into PEA cardiac arrest.  CPR initiated as below.`   Please see "Significant Hospital Events" section below for full detailed hospital course.  Pertinent  Medical History   Past Medical History:  Diagnosis Date   Acute blood loss anemia 03/07/2022   Acute respiratory failure due to COVID-19 [facility]) 09/20/2019   Anemia    pernicious anemia   Arrhythmia    patient unaware of diagnosis except heart skips beats   Cancer (HCC)    lip cancer   Complication of anesthesia    unable to void after surgery and had to stay overnight for several days w/ cath   Diabetes mellitus without complication (HCC)    Headache    History of 2019 novel coronavirus disease (COVID-19) 09/26/2019   Formatting of this note might be different from the original. 9/21   History of CVA (cerebrovascular accident) 10/03/2019   Formatting of this note might be different from the original. Right basal ganglia stroke post Covid, 9/21 Right corona radiata infarct, 7/23 Right lateral thalamus, 8/23   History of kidney stones     Hypertension    Kidney stone    Stroke [facility])    UGIB (upper gastrointestinal bleed) 03/07/2022     Micro Data:  9/21: SARS-CoV-2 PCR>>negative 9/21: RVP>> 9/21: Blood culture x2>> 9/21: MRSA PCR>>negative 9/21: Tracheal aspirate>>  Antimicrobials:   Anti-infectives (From admission, onward)    Start     Dose/Rate Route Frequency Ordered Stop   10/01/22 1100  ceFEPIme (MAXIPIME) 2 g in sodium chloride 0.9 % 100 mL IVPB        2 g 200 mL/hr over 30 Minutes Intravenous Every 24 hours 10/01/22 0831     10/01/22 0930  vancomycin (VANCOREADY) IVPB 1500 mg/300 mL        1,500 mg 150 mL/hr over 120 Minutes Intravenous  Once 10/01/22 0831     10/01/22 0900  Ampicillin-Sulbactam (UNASYN) 3 g in sodium chloride 0.9 % 100 mL IVPB  Status:  Discontinued        3 g 200 mL/hr over 30 Minutes Intravenous Every 12 hours 10/01/22 0734 10/01/22 0829        Significant Hospital Events: Including procedures, antibiotic start and stop dates in addition to other pertinent events   2022-10-30: Presented to ED with chest pain.  Admitted by [facility] to [facility]. 9/21: Early this morning decompensated with development of A.fib, chest pain, SOB, and hypotension.  Transferred to ICU requiring emergent intubation.  Suffered PEA Cardiac arrest following intubation, critically ill with multiorgan failure.  Unresponsive off sedation, concern for possible anoxic brain injury.  Code status changed to DNR.  Palliative Care consulted  Interim History / Subjective:  -Seen in the ICU following intubation and cardiac arrest -Critically ill, requiring Levophed -Unresponsive off sedation, currently with no brainstem reflexes  Objective   Blood pressure (!) 128/101, pulse (!) 40, temperature (!) 97 F (36.1 C), temperature source Oral, resp. rate 20, height 6\' 2"  (1.88 m), weight 63.3 kg, SpO2 100%.    Vent Mode: PRVC FiO2 (%):  [60 %-100 %] 60 % Set Rate:  [20 bmp] 20 bmp Vt Set:  [500 mL] 500 mL PEEP:  [5 cmH20-8  cmH20] 8 cmH20 Plateau Pressure:  [11 cmH20-14 cmH20] 14 cmH20   Intake/Output Summary (Last 24 hours) at 10/01/2022 0820 Last data filed at 10/01/2022 0600 Gross per 24 hour  Intake 1272.26 ml  Output --  Net 1272.26 ml   Filed Weights   09/24/2022 1946 10/01/22 0000 10/01/22 0700  Weight: 66.2 kg 63.6 kg 63.3 kg    Examination: General: Critically ill-appearing frail elderly male, laying in bed, intubated, not currently on sedation, no acute distress HENT: Atraumatic, normocephalic, neck supple, no JVD Lungs: Coarse breath sounds throughout, even, nonlabored, synchronous with the vent Cardiovascular: Irregular irregular rhythm, rate controlled, no murmurs, rubs, gallops Abdomen: Soft, nontender, nondistended, no guarding rebound tenderness, bowel sounds positive x 4 Extremities: Cool extremities with cyanosis/mottling of fingers and toes, normal bulk and tone, no deformities, no edema Neuro: Unresponsive, on no sedation, does not withdrawal to painful stimuli, currently no brainstem reflexes (no cough/gag/corneal reflexes, no respiratory drive over the ventilator, pupils minimally responsive) GU: Foley catheter in place  Resolved Hospital Problem list     Assessment & Plan:   #In-Hospital PEA Cardiac Arrest #Shock: Cardiogenic +/- Septic #NSTEMI #Acute Decompensated HFrEF #Atrial Fibrillation PMHx: HTN -Continuous cardiac monitoring -Maintain MAP >65 -Cautious IV fluids -Vasopressors as needed to maintain MAP goal -Trend lactic acid until normalized -Trend HS Troponin until peaked  -BNP is 3,137 -Check Coox panel ~ SvO2 is 42 ~currently holding on inotropes given concern for worsening cardiac demand -Dr. Larinda Buttery discussed with [facility], ICU and advanced heart failure team, patient not a candidate for mechanical cardiac support -Echocardiogram pending -Diuresis as BP and renal function permits ~ holding due to shock and AKI -Cardiology consulted, appreciate  input -Heparin drip for 48 hours per ACS protocol  #Acute Hypoxic Respiratory Failure in setting of Cardiac Arrest, Acute Decompensated HFrEF, bilateral pleural effusions, compressive atelectasis, and concern for Aspiration CT Chest 9/21 with moderate bilateral pleural effusions and bilateral LL compressive atelectasis -Full vent support, implement lung protective strategies -Plateau pressures less than 30 cm H20 -Wean FiO2 & PEEP as tolerated to maintain O2 sats >92% -Follow intermittent Chest X-ray & ABG as needed -Spontaneous Breathing Trials when respiratory parameters met and mental status permits -Implement VAP Bundle -Prn Bronchodilators -ABX as above -Diuresis as BP and renal function permits ~ holding due to shock and AKI  #Acute Kidney Injury #AG Metabolic Acidosis #Lactic Acidosis #Mild Hyponatremia, suspect hypotonic hypervolemic with CHF PMHx: Kidney stones -Monitor I&O's / urinary output -Follow BMP -Ensure adequate renal perfusion -Avoid nephrotoxic agents as able -Replace electrolytes as indicated ~ Pharmacy following for assistance with electrolyte replacement  #Concern for Aspiration -Monitor fever curve -Trend WBC's & Procalcitonin -Follow cultures as above -Start empiric Cefepime and Vancomycin pending cultures & sensitivities  #Elevated LFT's, concern for early developing shock liver CT Abdomen & Pelvis 9/21 with Stable small fluid attenuation cyst in the anterior liver dome. No masses  visualized on this unenhanced exam. Periportal edema again seen. High attenuation gallbladder sludge noted, without signs of cholecystitis or biliary ductal dilatation. -Trend LFT's and coags  #Diabetes Mellitus with Hyperglycemia -CBG's q4h; Target range of 140 to 180 -SSI -Follow ICU Hypo/Hyperglycemia protocol  #Anemia of Chronic Disease -Monitor for S/Sx of bleeding -Trend CBC -Heparin gtt for Anticoagulation/VTE Prophylaxis  -Transfuse for Hgb  <7  #Unresponsive s/p cardiac arrest, concern for possible anoxic brain injury #Sedation needs in setting of mechanical ventilation PMHx: CVA CT Head 9/20 with no acute intracranial abnormality, small old right basal ganglia infarcts UDS is negative -Maintain a RASS goal of 0 to -1 -Propofol if needed to maintain RASS goal ~ currently on NO sedation -Avoid sedating medications as able -Daily wake up assessment -Repeat CT Head is pending -Obtain EEG -Low threshold for Neurology consultation      Patient is critically ill following cardiac arrest with multiorgan failure and concern for anoxic brain injury.  Prognosis is extremely guarded, high risk for further decompensation and subsequent cardiac arrest.  Given current critical illness superimposed on multiple chronic comorbidities and advanced age, overall long-term prognosis is poor.  Patient is now DNR.  Will likely need to get up 72 hours to allow for neuro prognostication.  Palliative care consulted.   Best Practice (right click and "Reselect all SmartList Selections" daily)   Diet/type: NPO DVT prophylaxis: systemic heparin GI prophylaxis: PPI Lines: Central line and yes and it is still needed Foley:  Yes, and it is still needed Code Status:  DNR Last date of multidisciplinary goals of care discussion [9/21]  9/21: Patient's family updated at bedside by Dr. Larinda Buttery  Labs   CBC: Recent Labs  Lab 10/04/2022 1952 10/01/22 0416  WBC 11.7* 10.4  NEUTROABS  --  7.3  HGB 12.3* 12.1*  HCT 36.9* 36.6*  MCV 92.5 93.4  PLT 266 269    Basic Metabolic Panel: Recent Labs  Lab 10/09/2022 1952 09/13/2022 2225 10/01/22 0418  NA 133*  --  132*  K 3.6  --  3.5  CL 98  --  102  CO2 20*  --  16*  GLUCOSE 380*  --  384*  BUN 54*  --  52*  CREATININE 2.42*  --  2.61*  CALCIUM 9.0  --  8.6*  MG  --  2.5*  --    GFR: Estimated Creatinine Clearance: 20.2 mL/min (A) (by C-G formula based on SCr of 2.61 mg/dL (H)). Recent Labs   Lab 09/23/2022 1952 10/01/22 0416 10/01/22 0604  WBC 11.7* 10.4  --   LATICACIDVEN  --   --  8.4*    Liver Function Tests: Recent Labs  Lab 10/01/22 0418  AST 36  ALT 36  ALKPHOS 183*  BILITOT 1.1  PROT 5.9*  ALBUMIN 2.7*   No results for input(s): "LIPASE", "AMYLASE" in the last 168 hours. No results for input(s): "AMMONIA" in the last 168 hours.  ABG    Component Value Date/Time   PHART 7.32 (L) 10/01/2022 0535   PCO2ART 38 10/01/2022 0535   PO2ART 32 (LL) 10/01/2022 0535   HCO3 19.6 (L) 10/01/2022 0535   ACIDBASEDEF 6.0 (H) 10/01/2022 0535   O2SAT PENDING 10/01/2022 0535     Coagulation Profile: Recent Labs  Lab 09/15/2022 1952  INR 2.0*    Cardiac Enzymes: No results for input(s): "CKTOTAL", "CKMB", "CKMBINDEX", "TROPONINI" in the last 168 hours.  HbA1C: Hgb A1c MFr Bld  Date/Time Value Ref Range Status  06/15/2022 07:06  AM 8.8 (H) 4.8 - 5.6 % Final    Comment:    (NOTE)         Prediabetes: 5.7 - 6.4         Diabetes: >6.4         Glycemic control for adults with diabetes: <7.0   03/07/2022 05:15 PM 7.8 (H) 4.8 - 5.6 % Final    Comment:    (NOTE)         Prediabetes: 5.7 - 6.4         Diabetes: >6.4         Glycemic control for adults with diabetes: <7.0     CBG: Recent Labs  Lab 10/01/22 0353 10/01/22 0457 10/01/22 0745  GLUCAP 348* 331* 325*    Review of Systems:   Unable to assess due to intubation/critical illness/AMS   Past Medical History:  He,  has a past medical history of Acute blood loss anemia (03/07/2022), Acute respiratory failure due to COVID-19 [facility]) (09/20/2019), Anemia, Arrhythmia, Cancer (HCC), Complication of anesthesia, Diabetes mellitus without complication (HCC), Headache, History of 2019 novel coronavirus disease (COVID-19) (09/26/2019), History of CVA (cerebrovascular accident) (10/03/2019), History of kidney stones, Hypertension, Kidney stone, Stroke (HCC), and UGIB (upper gastrointestinal bleed) (03/07/2022).    Surgical History:   Past Surgical History:  Procedure Laterality Date   CYSTOSCOPY W/ RETROGRADES Right 11/14/2017   Procedure: CYSTOSCOPY WITH RETROGRADE PYELOGRAM;  Surgeon: Riki Altes, MD;  Location: [facility] ORS;  Service: Urology;  Laterality: Right;   CYSTOSCOPY WITH STENT PLACEMENT Right 11/03/2017   Procedure: CYSTOSCOPY WITH STENT PLACEMENT;  Surgeon: Riki Altes, MD;  Location: [facility] ORS;  Service: Urology;  Laterality: Right;   CYSTOSCOPY/RETROGRADE/URETEROSCOPY Right 11/03/2017   Procedure: CYSTOSCOPY/RETROGRADE/URETEROSCOPY;  Surgeon: Riki Altes, MD;  Location: [facility] ORS;  Service: Urology;  Laterality: Right;   CYSTOSCOPY/URETEROSCOPY/HOLMIUM LASER/STENT PLACEMENT Right 11/14/2017   Procedure: CYSTOSCOPY/URETEROSCOPY/HOLMIUM LASER/STENT Exchange;  Surgeon: Riki Altes, MD;  Location: [facility] ORS;  Service: Urology;  Laterality: Right;  stent exchange   ESOPHAGOGASTRODUODENOSCOPY (EGD) WITH PROPOFOL N/A 03/10/2022   Procedure: ESOPHAGOGASTRODUODENOSCOPY (EGD) WITH PROPOFOL;  Surgeon: Wyline Mood, MD;  Location: [facility] ENDOSCOPY;  Service: Gastroenterology;  Laterality: N/A;   EYE SURGERY Right    cataract extraction.  needed yag laser and still has problems    HAND SURGERY Right 1989   tendons came undone from bones in wrist. metal removed   HERNIA REPAIR Bilateral 2010   inguinal hernias   HERNIA REPAIR       Social History:   reports that he has never smoked. He has never used smokeless tobacco. He reports that he does not drink alcohol and does not use drugs.   Family History:  His family history includes Heart attack in his father; Stroke in his mother.   Allergies Allergies  Allergen Reactions   Mirtazapine Other (See Comments)    Other reaction(s): Dizziness-per wife, Patient has been tolerating 7.5 mg without dizziness.     Home Medications  Prior to Admission medications   Medication Sig Start Date End Date Taking? Authorizing Provider   amLODipine (NORVASC) 5 MG tablet Take 0.5 tablets by mouth at bedtime. Pt takes 2.5 mg 04/28/22 04/28/23 Yes [provider]  apixaban (ELIQUIS) 5 MG TABS tablet Take 1 tablet (5 mg total) by mouth 2 (two) times daily. 06/16/22  Yes Rocky Morel, DO  docusate sodium (COLACE) 250 MG capsule Take 250 mg by mouth daily.   Yes [provider]  LEVEMIR 100  UNIT/ML injection Inject 5-20 Units into the skin every morning. 04/05/19  Yes [provider]  omeprazole (PRILOSEC) 40 MG capsule Take 40 mg by mouth 2 (two) times daily before a meal. 04/16/19  Yes [provider]  tamsulosin (FLOMAX) 0.4 MG CAPS capsule Take 0.4 mg by mouth daily.   Yes [provider]  atorvastatin (LIPITOR) 40 MG tablet Take 1 tablet (40 mg total) by mouth daily. 06/23/22 07/23/22  Tresa Moore, MD  levothyroxine (SYNTHROID) 75 MCG tablet Take 1 tablet (75 mcg total) by mouth daily at 6 (six) AM. Patient not taking: Reported on 10/01/2022 06/17/22   Rocky Morel, DO  simethicone (MYLICON) 125 MG chewable tablet Chew 125 mg by mouth at bedtime. Pt takes in morning    [provider]     Critical care time: 60 minutes     Harlon Ditty, AGACNP-BC [facility] Prefer epic messenger for cross cover needs If after hours, please call E-link

## 2022-10-01 NOTE — Assessment & Plan Note (Signed)
-   We will continue PPI therapy 

## 2022-10-01 NOTE — Progress Notes (Signed)
Notified Eber Jones, [facility] of critical ptt of 177. Pharmacist instructed RN to redraw aptt.

## 2022-10-01 NOTE — H&P (Addendum)
[place]   PATIENT NAME: Kurt Hernandez    MR#:  [id number]  DATE OF BIRTH:  04/15/1940  DATE OF ADMISSION:  Oct 05, 2022  PRIMARY CARE PHYSICIAN: Danella Penton, MD   Patient is coming from: Home  REQUESTING/REFERRING PHYSICIAN: Alfonse Flavors, MD  CHIEF COMPLAINT:   Chief Complaint  Patient presents with   Chest Pain    HISTORY OF PRESENT ILLNESS:  Kurt Hernandez is a 82 y.o. male with medical history significant for hypertension, CVAs, urolithiasis, CVA, type 2 diabetes mellitus, and pernicious anemia, who presented to emergency room with acute onset of midsternal chest pain felt as pressure and tightness, that started 3 days ago after having a fall and syncope at home.  She also had mechanical fall tripping over some items on his porch.  His chest pain has been intermittent since then and persisted today.  He had to associated nausea, vomiting and diaphoresis.  He has been having cough productive of yellow sputum without wheezing.  No fever or chills.  No dysuria, oliguria or hematuria or flank pain.  No paresthesias or focal muscle weakness.  ED Course: When came to the ER, vital signs were within normal.  Labs showed mild hyponatremia and hyperglycemia of 380 with a CO2 of 20 and BUN 54 and creatinine 2.42 Compared to 24/1.97 on 07/01/2022.  Magnesium was 2.5 and high sensitive troponin RI was 2936 and later 2918.  CBC showed WBC of 11.7 with mild anemia.  INR was 2 PTT of 49 and PTT of 23.2.   EKG as reviewed by me : EKG showed normal sinus rhythm with rate of 90 with Q waves anteroseptally and T wave inversion laterally. Imaging: Portable chest x-ray showed small bilateral pleural effusions right greater than left with mild perihilar interstitial pulmonary infiltration possibly mild interstitial edema or airway inflammation.  Noncontrast head CT scan revealed no acute ventricular abnormalities.  It showed small old right basal ganglia infarcts and scattered air within  the deep soft tissue of the neck and venous structures of the cavernous sinuses. Chest CTA revealed the following: 1. Moderate bilateral pleural effusions with compressive atelectasis of the dependent lungs bilaterally. Mild ascites within the visualized upper abdomen. Mild thickening of the peribronchovascular interstitium may reflect trace pulmonary edema and together these findings may relate to changes of cardiogenic failure. 2. Moderate multi-vessel coronary artery calcification. 3. Mild dilation of the thoracic aorta measuring up to 4.2 cm in diameter in its ascending segment. Recommend follow-up ultrasound every 12 months and vascular consultation. 4. Shotty mediastinal adenopathy, nonspecific and may be reactive or inflammatory in nature. Low-grade lymphoproliferative processes could appear similarly. In absence of interval prior examination, PET CT examination or follow-up CT imaging may be helpful in 3 months to document stability or resolution. 5. Nonobstructing right renal calculi. 6. 15 mm hypodense nodule within the right thyroid gland, not well characterized on this examination, but appears stable since prior examination. If indicated, depending on the patient's comorbidities, this could be better assessed with dedicated thyroid sonography.  The patient was given 500 mL IV normal saline bolus, for review aspirin and started on IV heparin with bolus and drip.  He will be admitted to a progressive unit bed for further evaluation and management.  PAST MEDICAL HISTORY:   Past Medical History:  Diagnosis Date   Acute blood loss anemia 03/07/2022   Acute respiratory failure due to COVID-19 (HCC) 09/20/2019   Anemia    pernicious anemia  Arrhythmia    patient unaware of diagnosis except heart skips beats   Cancer (HCC)    lip cancer   Complication of anesthesia    unable to void after surgery and had to stay overnight for several days w/ cath   Diabetes mellitus without  complication (HCC)    Headache    History of 2019 novel coronavirus disease (COVID-19) 09/26/2019   Formatting of this note might be different from the original. 9/21   History of CVA (cerebrovascular accident) 10/03/2019   Formatting of this note might be different from the original. Right basal ganglia stroke post Covid, 9/21 Right corona radiata infarct, 7/23 Right lateral thalamus, 8/23   History of kidney stones    Hypertension    Kidney stone    Stroke [facility])    UGIB (upper gastrointestinal bleed) 03/07/2022    PAST SURGICAL HISTORY:   Past Surgical History:  Procedure Laterality Date   CYSTOSCOPY W/ RETROGRADES Right 11/14/2017   Procedure: CYSTOSCOPY WITH RETROGRADE PYELOGRAM;  Surgeon: Riki Altes, MD;  Location: [facility] ORS;  Service: Urology;  Laterality: Right;   CYSTOSCOPY WITH STENT PLACEMENT Right 11/03/2017   Procedure: CYSTOSCOPY WITH STENT PLACEMENT;  Surgeon: Riki Altes, MD;  Location: [facility] ORS;  Service: Urology;  Laterality: Right;   CYSTOSCOPY/RETROGRADE/URETEROSCOPY Right 11/03/2017   Procedure: CYSTOSCOPY/RETROGRADE/URETEROSCOPY;  Surgeon: Riki Altes, MD;  Location: [facility] ORS;  Service: Urology;  Laterality: Right;   CYSTOSCOPY/URETEROSCOPY/HOLMIUM LASER/STENT PLACEMENT Right 11/14/2017   Procedure: CYSTOSCOPY/URETEROSCOPY/HOLMIUM LASER/STENT Exchange;  Surgeon: Riki Altes, MD;  Location: [facility] ORS;  Service: Urology;  Laterality: Right;  stent exchange   ESOPHAGOGASTRODUODENOSCOPY (EGD) WITH PROPOFOL N/A 03/10/2022   Procedure: ESOPHAGOGASTRODUODENOSCOPY (EGD) WITH PROPOFOL;  Surgeon: Wyline Mood, MD;  Location: [facility] ENDOSCOPY;  Service: Gastroenterology;  Laterality: N/A;   EYE SURGERY Right    cataract extraction.  needed yag laser and still has problems    HAND SURGERY Right 1989   tendons came undone from bones in wrist. metal removed   HERNIA REPAIR Bilateral 2010   inguinal hernias   HERNIA REPAIR      SOCIAL HISTORY:   Social History    Tobacco Use   Smoking status: Never   Smokeless tobacco: Never  Substance Use Topics   Alcohol use: No    Comment: rare    FAMILY HISTORY:   Family History  Problem Relation Age of Onset   Heart attack Father    Stroke Mother     DRUG ALLERGIES:   Allergies  Allergen Reactions   Mirtazapine Other (See Comments)    Other reaction(s): Dizziness-per wife, Patient has been tolerating 7.5 mg without dizziness.    REVIEW OF SYSTEMS:   ROS As per history of present illness. All pertinent systems were reviewed above. Constitutional, HEENT, cardiovascular, respiratory, GI, GU, musculoskeletal, neuro, psychiatric, endocrine, integumentary and hematologic systems were reviewed and are otherwise negative/unremarkable except for positive findings mentioned above in the HPI.   MEDICATIONS AT HOME:   Prior to Admission medications   Medication Sig Start Date End Date Taking? Authorizing Provider  amLODipine (NORVASC) 5 MG tablet Take 1 tablet by mouth at bedtime. 04/28/22 04/28/23  [provider]  apixaban (ELIQUIS) 5 MG TABS tablet Take 1 tablet (5 mg total) by mouth 2 (two) times daily. 06/16/22   Rocky Morel, DO  atorvastatin (LIPITOR) 40 MG tablet Take 1 tablet (40 mg total) by mouth daily. 06/23/22 07/23/22  Tresa Moore, MD  LEVEMIR 100 UNIT/ML injection Inject 5-20  Units into the skin every morning. 04/05/19   [provider]  levothyroxine (SYNTHROID) 75 MCG tablet Take 1 tablet (75 mcg total) by mouth daily at 6 (six) AM. 06/17/22   Rocky Morel, DO  omeprazole (PRILOSEC) 40 MG capsule Take 40 mg by mouth 2 (two) times daily before a meal. 04/16/19   [provider]      VITAL SIGNS:  Blood pressure (!) 106/93, pulse (!) 106, temperature 97.6 F (36.4 C), temperature source Oral, resp. rate 20, height 6\' 2"  (1.88 m), weight 66.2 kg, SpO2 90%.  PHYSICAL EXAMINATION:  Physical Exam  GENERAL:  82 y.o.-year-old Caucasian male patient lying in  the bed with no acute distress.  EYES: Pupils equal, round, reactive to light and accommodation. No scleral icterus. Extraocular muscles intact.  HEENT: Head atraumatic, normocephalic. Oropharynx and nasopharynx clear.  NECK:  Supple, no jugular venous distention. No thyroid enlargement, no tenderness.  LUNGS: Normal breath sounds bilaterally, no wheezing, rales,rhonchi or crepitation. No use of accessory muscles of respiration.  CARDIOVASCULAR: Regular rate and rhythm, S1, S2 normal. No murmurs, rubs, or gallops.  ABDOMEN: Soft, nondistended, nontender. Bowel sounds present. No organomegaly or mass.  EXTREMITIES: No pedal edema, cyanosis, or clubbing.  NEUROLOGIC: Cranial nerves II through XII are intact. Muscle strength 5/5 in all extremities. Sensation intact. Gait not checked.  PSYCHIATRIC: The patient is alert and oriented x 3.  Normal affect and good eye contact. SKIN: No obvious rash, lesion, or ulcer.   LABORATORY PANEL:   CBC Recent Labs  Lab 10-27-2022 1952  WBC 11.7*  HGB 12.3*  HCT 36.9*  PLT 266   ------------------------------------------------------------------------------------------------------------------  Chemistries  Recent Labs  Lab 10-27-22 1952 27-Oct-2022 2225  NA 133*  --   K 3.6  --   CL 98  --   CO2 20*  --   GLUCOSE 380*  --   BUN 54*  --   CREATININE 2.42*  --   CALCIUM 9.0  --   MG  --  2.5*   ------------------------------------------------------------------------------------------------------------------  Cardiac Enzymes No results for input(s): "TROPONINI" in the last 168 hours. ------------------------------------------------------------------------------------------------------------------  RADIOLOGY:  CT Head Wo Contrast  Result Date: 2022/10/27 CLINICAL DATA:  Head trauma and chest pain. EXAM: CT HEAD WITHOUT CONTRAST TECHNIQUE: Contiguous axial images were obtained from the base of the skull through the vertex without intravenous  contrast. RADIATION DOSE REDUCTION: This exam was performed according to the departmental dose-optimization program which includes automated exposure control, adjustment of the mA and/or kV according to patient size and/or use of iterative reconstruction technique. COMPARISON:  Head CT 07/01/2022 FINDINGS: Brain: No evidence of acute infarction, hemorrhage, hydrocephalus, extra-axial collection or mass lesion/mass effect. Again seen is mild diffuse atrophy. Small old infarcts are seen in the right basal ganglia. Vascular: There is scattered air seen within the deep soft tissues of the neck and venous structures of the cavernous sinuses. Skull: Normal. Negative for fracture or focal lesion. Sinuses/Orbits: No acute finding. Other: None. IMPRESSION: 1. No acute intracranial abnormality. 2. 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 3. Scattered air within the deep soft tissues of the neck and venous structures of the cavernous sinuses. Please correlate clinically. Electronically Signed   By: Darliss Cheney M.D.   On: 10/27/22 22:59   CT CHEST WO CONTRAST  Result Date: October 27, 2022 CLINICAL DATA:  Blunt chest trauma, fall EXAM: CT CHEST WITHOUT CONTRAST TECHNIQUE: Multidetector CT imaging of the chest was performed following the standard protocol without IV contrast. RADIATION DOSE  REDUCTION: This exam was performed according to the departmental dose-optimization program which includes automated exposure control, adjustment of the mA and/or kV according to patient size and/or use of iterative reconstruction technique. COMPARISON:  09/14/2021 FINDINGS: Cardiovascular: Cardiac size at the upper limits of normal. Moderate multi-vessel coronary artery calcification. No pericardial effusion. The central pulmonary arteries are enlarged in keeping with changes of pulmonary arterial hypertension. Moderate atherosclerotic calcification is seen within the thoracic aorta. There is mild dilation of the thoracic aorta measuring 4.2  cm in diameter in its ascending segment, and 3.2 cm in diameter in its proximal and distal descending segment. Mediastinum/Nodes: 15 mm hypodense nodule within the right thyroid gland is not well characterized on this examination, but appears stable since prior examination. There is punctate subcutaneous gas noted within the visualized neck soft tissues bilaterally and subclavicular regions bilaterally which may relate to trauma given the provided history. Punctate gas within the mediastinum, however, appears intravascular within the brachiocephalic vein and superior vena cava and likely relates to intravenous injection. No pneumomediastinum. Shotty mediastinal adenopathy has developed with index lymph nodes measuring up to 13 mm in short axis diameter which is nonspecific and may be reactive or inflammatory in nature. Low-grade lymphoproliferative processes could appear similarly. This appears new since prior examination. The esophagus is unremarkable. Lungs/Pleura: Moderate bilateral pleural effusions are present with compressive atelectasis of the dependent lungs bilaterally. Mild thickening of the peribronchovascular interstitium may relate to airway inflammation or trace interstitial pulmonary edema. No pneumothorax. No focal consolidation. No central obstructing lesion. Upper Abdomen: Mild ascites noted within the visualized upper abdomen. Punctate nonobstructing calculi noted within the visualized upper pole the right kidney measuring up to 2 mm. No acute abnormality identified. Musculoskeletal: Osseous structures are age-appropriate. No acute bone abnormality. IMPRESSION: 1. Moderate bilateral pleural effusions with compressive atelectasis of the dependent lungs bilaterally. Mild ascites within the visualized upper abdomen. Mild thickening of the peribronchovascular interstitium may reflect trace pulmonary edema and together these findings may relate to changes of cardiogenic failure. 2. Moderate  multi-vessel coronary artery calcification. 3. Mild dilation of the thoracic aorta measuring up to 4.2 cm in diameter in its ascending segment. Recommend follow-up ultrasound every 12 months and vascular consultation. 4. Shotty mediastinal adenopathy, nonspecific and may be reactive or inflammatory in nature. Low-grade lymphoproliferative processes could appear similarly. In absence of interval prior examination, PET CT examination or follow-up CT imaging may be helpful in 3 months to document stability or resolution. 5. Nonobstructing right renal calculi. 6. 15 mm hypodense nodule within the right thyroid gland, not well characterized on this examination, but appears stable since prior examination. If indicated, depending on the patient's comorbidities, this could be better assessed with dedicated thyroid sonography. Aortic Atherosclerosis (ICD10-I70.0). Electronically Signed   By: Helyn Numbers M.D.   On: 09/24/2022 21:55   DG Chest 1 View  Result Date: 10/02/2022 CLINICAL DATA:  Chest pain EXAM: CHEST  1 VIEW COMPARISON:  06/20/2022 FINDINGS: The lungs are symmetrically well inflated. Small bilateral pleural effusions are present, right greater than left. Mild slightly asymmetric perihilar interstitial pulmonary infiltrate is present, new since prior examination, possibly reflecting mild interstitial pulmonary edema or airway inflammation. No pneumothorax. Cardiac size within normal limits. No acute bone abnormality. IMPRESSION: 1. Small bilateral pleural effusions, right greater than left. 2. Mild perihilar interstitial pulmonary infiltrate, possibly reflecting mild interstitial pulmonary edema or airway inflammation. Electronically Signed   By: Helyn Numbers M.D.   On: 09/14/2022 21:43  IMPRESSION AND PLAN:  Assessment and Plan: * NSTEMI (non-ST elevated myocardial infarction) (HCC) - The patient will be admitted to a progressive unit bed. - We will continue him on IV heparin. - Will be  placed on high-dose statin as well as aspirin. - As needed sublingual nitroglycerin and IV morphine sulfate will be given for pain. - We will add small dose beta-blocker. - Will obtain 2D echo and cardiology consult in AM. - I notified Dr. Welton Flakes (covering [facility]) about the patient.  Acute kidney injury superimposed on chronic kidney disease (HCC) - The patient be hydrated with IV normal saline and will follow BMP. - Will avoid nephrotoxins.  GERD without esophagitis - We will continue PPI therapy.  Type 2 diabetes mellitus with stage 3b chronic kidney disease, without long-term current use of insulin (HCC) - The patient will be placed on supplement coverage with NovoLog. - We will continue basal coverage.  Acquired hypothyroidism - We will continue Synthroid.  Dyslipidemia - We will continue statin therapy.  BPH (benign prostatic hyperplasia) - We will continue Flomax   DVT prophylaxis: IV heparin. Advanced Care Planning:  Code Status: full code.  Family Communication:  The plan of care was discussed in details with the patient (and family). I answered all questions. The patient agreed to proceed with the above mentioned plan. Further management will depend upon hospital course. Disposition Plan: Back to previous home environment Consults called: Cardiology All the records are reviewed and case discussed with ED provider.  Status is: Inpatient  At the time of the admission, it appears that the appropriate admission status for this patient is inpatient.  This is judged to be reasonable and necessary in order to provide the required intensity of service to ensure the patient's safety given the presenting symptoms, physical exam findings and initial radiographic and laboratory data in the context of comorbid conditions.  The patient requires inpatient status due to high intensity of service, high risk of further deterioration and high frequency of surveillance required.  I  certify that at the time of admission, it is my clinical judgment that the patient will require inpatient hospital care extending more than 2 midnights.                            Dispo: The patient is from: Home              Anticipated d/c is to: Home              Patient currently is not medically stable to d/c.              Difficult to place patient: No  Hannah Beat M.D on 10/01/2022 at 4:00 AM  [facility]   From 7 PM-7 AM, contact night-coverage [email or web address]  CC: Primary care physician; Danella Penton, MD

## 2022-10-01 NOTE — Consult Note (Signed)
Kurt Hernandez is a [id number]  Primary Cardiologist: [facility] cardiology Reason for Consultation: Non-STEMI  HPI: This is a 82 year old white male with a past medical history of diabetes and CVA presented to the hospital with shortness of breath and was found to be in non-STEMI.  According to the family and ICU physician patient was found to be unresponsive after being admitted to regular floor requiring CPR and intubation.  I was asked to evaluate the patient as patient was found to have elevated troponin and for further evaluation.  I was called to evaluate the patient this morning around 6 AM.  I went to see the patient right away and discussed with ICU physician as well as the family about the options as patient is intubated and unresponsive to painful stimuli.   Review of Systems: Unable to review the system   Past Medical History:  Diagnosis Date   Acute blood loss anemia 03/07/2022   Acute respiratory failure due to COVID-19 [facility]) 09/20/2019   Anemia    pernicious anemia   Arrhythmia    patient unaware of diagnosis except heart skips beats   Cancer (HCC)    lip cancer   Complication of anesthesia    unable to void after surgery and had to stay overnight for several days w/ cath   Diabetes mellitus without complication (HCC)    Headache    History of 2019 novel coronavirus disease (COVID-19) 09/26/2019   Formatting of this note might be different from the original. 9/21   History of CVA (cerebrovascular accident) 10/03/2019   Formatting of this note might be different from the original. Right basal ganglia stroke post Covid, 9/21 Right corona radiata infarct, 7/23 Right lateral thalamus, 8/23   History of kidney stones    Hypertension    Kidney stone    Stroke [facility])    UGIB (upper gastrointestinal bleed) 03/07/2022    Medications Prior to Admission  Medication Sig Dispense Refill   amLODipine (NORVASC) 5 MG tablet Take 0.5 tablets by mouth at bedtime. Pt takes 2.5 mg      apixaban (ELIQUIS) 5 MG TABS tablet Take 1 tablet (5 mg total) by mouth 2 (two) times daily. 60 tablet 0   docusate sodium (COLACE) 250 MG capsule Take 250 mg by mouth daily.     LEVEMIR 100 UNIT/ML injection Inject 5-20 Units into the skin every morning.     omeprazole (PRILOSEC) 40 MG capsule Take 40 mg by mouth 2 (two) times daily before a meal.     tamsulosin (FLOMAX) 0.4 MG CAPS capsule Take 0.4 mg by mouth daily.     atorvastatin (LIPITOR) 40 MG tablet Take 1 tablet (40 mg total) by mouth daily. 30 tablet 0   levothyroxine (SYNTHROID) 75 MCG tablet Take 1 tablet (75 mcg total) by mouth daily at 6 (six) AM. (Patient not taking: Reported on 10/01/2022) 30 tablet 0   simethicone (MYLICON) 125 MG chewable tablet Chew 125 mg by mouth at bedtime. Pt takes in morning        amLODipine  2.5 mg Oral QHS   [START ON 10/02/2022] aspirin EC  81 mg Oral Daily   atorvastatin  80 mg Oral QHS   Chlorhexidine Gluconate Cloth  6 each Topical Daily   etomidate       guaiFENesin  30 mL Oral Q12H   insulin aspart  0-15 Units Subcutaneous Q4H   insulin detemir  12 Units Subcutaneous q morning   levothyroxine  100 mcg  Oral Q0600   melatonin  5 mg Oral Once   mouth rinse  15 mL Mouth Rinse Q2H   pantoprazole (PROTONIX) IV  40 mg Intravenous Daily   rocuronium bromide        Infusions:  sodium chloride Stopped (10/01/22 0903)   sodium chloride Stopped (10/01/22 0651)   ceFEPime (MAXIPIME) IV 2 g (10/01/22 1110)   epinephrine 0.5 mcg/min (10/01/22 1047)   heparin 1,000 Units/hr (10/01/22 0752)   norepinephrine (LEVOPHED) Adult infusion 20 mcg/min (10/01/22 1102)   promethazine (PHENERGAN) injection (IM or IVPB)     propofol (DIPRIVAN) infusion      Allergies  Allergen Reactions   Mirtazapine Other (See Comments)    Other reaction(s): Dizziness-per Kurt Hernandez, Patient has been tolerating 7.5 mg without dizziness.    Social History   Socioeconomic History   Marital status: Married    Spouse name:  Kurt Hernandez, Kurt Hernandez   Number of children: Not on file   Years of education: Not on file   Highest education level: Not on file  Occupational History   Occupation: cattle farmer    Comment: still working  Tobacco Use   Smoking status: Never   Smokeless tobacco: Never  Vaping Use   Vaping status: Never Used  Substance and Sexual Activity   Alcohol use: No    Comment: rare   Drug use: No   Sexual activity: Yes    Birth control/protection: None  Other Topics Concern   Not on file  Social History Narrative   Not on file   Social Determinants of Health   Financial Resource Strain: Not on file  Food Insecurity: No Food Insecurity (06/21/2022)   Hunger Vital Sign    Worried About Running Out of Food in the Last Year: Never true    Ran Out of Food in the Last Year: Never true  Transportation Needs: No Transportation Needs (06/21/2022)   PRAPARE - Administrator, Civil Service (Medical): No    Lack of Transportation (Non-Medical): No  Physical Activity: Not on file  Stress: Not on file  Social Connections: Not on file  Intimate Partner Violence: Not At Risk (06/21/2022)   Humiliation, Afraid, Rape, and Kick questionnaire    Fear of Current or Ex-Partner: No    Emotionally Abused: No    Physically Abused: No    Sexually Abused: No    Family History  Problem Relation Age of Onset   Heart attack Father    Stroke Mother     PHYSICAL EXAM: Vitals:   10/01/22 1100 10/01/22 1105  BP: 98/74 92/72  Pulse: (!) 27 (!) 28  Resp: 20 20  Temp: (!) 93 F (33.9 C)   SpO2: 98% 98%     Intake/Output Summary (Last 24 hours) at 10/01/2022 1120 Last data filed at 10/01/2022 1000 Gross per 24 hour  Intake 2258.5 ml  Output --  Net 2258.5 ml    General:  Well appearing. No respiratory difficulty HEENT: normal Neck: supple. no JVD. Carotids 2+ bilat; no bruits. No lymphadenopathy or thryomegaly appreciated. Cor: PMI nondisplaced. Regular rate & rhythm. No rubs, gallops or  murmurs. Lungs: clear Abdomen: soft, nontender, nondistended. No hepatosplenomegaly. No bruits or masses. Good bowel sounds. Extremities: no cyanosis, clubbing, rash, edema Neuro: alert & oriented x 3, cranial nerves grossly intact. moves all 4 extremities w/o difficulty. Affect pleasant.  ECG: Atrial fibrillation 90 bpm with nonspecific ST-T changes and old anteroseptal wall MI.  EKG done just after intubation revealed atrial  fibrillation with right bundle branch block and ventricular rate in the 120s.  Results for orders placed or performed during the hospital encounter of 10/30/22 (from the past 24 hour(s))  Basic metabolic panel     Status: Abnormal   Collection Time: 2022/10/30  7:52 PM  Result Value Ref Range   Sodium 133 (L) 135 - 145 mmol/L   Potassium 3.6 3.5 - 5.1 mmol/L   Chloride 98 98 - 111 mmol/L   CO2 20 (L) 22 - 32 mmol/L   Glucose, Bld 380 (H) 70 - 99 mg/dL   BUN 54 (H) 8 - 23 mg/dL   Creatinine, Ser 1.61 (H) 0.61 - 1.24 mg/dL   Calcium 9.0 8.9 - 09.6 mg/dL   GFR, Estimated 26 (L) >60 mL/min   Anion gap 15 5 - 15  CBC     Status: Abnormal   Collection Time: 30-Oct-2022  7:52 PM  Result Value Ref Range   WBC 11.7 (H) 4.0 - 10.5 K/uL   RBC 3.99 (L) 4.22 - 5.81 MIL/uL   Hemoglobin 12.3 (L) 13.0 - 17.0 g/dL   HCT 04.5 (L) 40.9 - 81.1 %   MCV 92.5 80.0 - 100.0 fL   MCH 30.8 26.0 - 34.0 pg   MCHC 33.3 30.0 - 36.0 g/dL   RDW 91.4 78.2 - 95.6 %   Platelets 266 150 - 400 K/uL   nRBC 0.0 0.0 - 0.2 %  Troponin I (High Sensitivity)     Status: Abnormal   Collection Time: 10/30/22  7:52 PM  Result Value Ref Range   Troponin I (High Sensitivity) 2,936 (HH) <18 ng/L  Protime-INR (order if Patient is taking Coumadin / Warfarin)     Status: Abnormal   Collection Time: October 30, 2022  7:52 PM  Result Value Ref Range   Prothrombin Time 23.2 (H) 11.4 - 15.2 seconds   INR 2.0 (H) 0.8 - 1.2  Heparin level     Status: Abnormal   Collection Time: 10-30-22  7:52 PM  Result Value Ref Range    Heparin Unfractionated >1.10 (H) 0.30 - 0.70 IU/mL  Troponin I (High Sensitivity)     Status: Abnormal   Collection Time: 30-Oct-2022 10:25 PM  Result Value Ref Range   Troponin I (High Sensitivity) 2,918 (HH) <18 ng/L  Magnesium     Status: Abnormal   Collection Time: 10/30/22 10:25 PM  Result Value Ref Range   Magnesium 2.5 (H) 1.7 - 2.4 mg/dL  APTT     Status: Abnormal   Collection Time: 10/01/22 12:57 AM  Result Value Ref Range   aPTT 49 (H) 24 - 36 seconds  SARS Coronavirus 2 by RT PCR (hospital order, performed in [facility] hospital lab) *cepheid single result test* Anterior Nasal Swab     Status: None   Collection Time: 10/01/22  3:45 AM   Specimen: Anterior Nasal Swab  Result Value Ref Range   SARS Coronavirus 2 by RT PCR NEGATIVE NEGATIVE  Glucose, capillary     Status: Abnormal   Collection Time: 10/01/22  3:53 AM  Result Value Ref Range   Glucose-Capillary 348 (H) 70 - 99 mg/dL  CBC with Differential/Platelet     Status: Abnormal   Collection Time: 10/01/22  4:16 AM  Result Value Ref Range   WBC 10.4 4.0 - 10.5 K/uL   RBC 3.92 (L) 4.22 - 5.81 MIL/uL   Hemoglobin 12.1 (L) 13.0 - 17.0 g/dL   HCT 21.3 (L) 08.6 - 57.8 %   MCV 93.4  80.0 - 100.0 fL   MCH 30.9 26.0 - 34.0 pg   MCHC 33.1 30.0 - 36.0 g/dL   RDW 16.1 09.6 - 04.5 %   Platelets 269 150 - 400 K/uL   nRBC 0.0 0.0 - 0.2 %   Neutrophils Relative % 71 %   Neutro Abs 7.3 1.7 - 7.7 K/uL   Lymphocytes Relative 21 %   Lymphs Abs 2.2 0.7 - 4.0 K/uL   Monocytes Relative 7 %   Monocytes Absolute 0.8 0.1 - 1.0 K/uL   Eosinophils Relative 1 %   Eosinophils Absolute 0.1 0.0 - 0.5 K/uL   Basophils Relative 0 %   Basophils Absolute 0.0 0.0 - 0.1 K/uL   Immature Granulocytes 0 %   Abs Immature Granulocytes 0.04 0.00 - 0.07 K/uL  Troponin I (High Sensitivity)     Status: Abnormal   Collection Time: 10/01/22  4:18 AM  Result Value Ref Range   Troponin I (High Sensitivity) 3,098 (HH) <18 ng/L  Blood gas, venous      Status: Abnormal   Collection Time: 10/01/22  4:18 AM  Result Value Ref Range   pH, Ven 7.31 7.25 - 7.43   pCO2, Ven 32 (L) 44 - 60 mmHg   pO2, Ven <31 (LL) 32 - 45 mmHg   Bicarbonate 16.1 (L) 20.0 - 28.0 mmol/L   Acid-base deficit 9.0 (H) 0.0 - 2.0 mmol/L   O2 Saturation 19.9 %   Patient temperature 37.0    Collection site VEIN   Comprehensive metabolic panel     Status: Abnormal   Collection Time: 10/01/22  4:18 AM  Result Value Ref Range   Sodium 132 (L) 135 - 145 mmol/L   Potassium 3.5 3.5 - 5.1 mmol/L   Chloride 102 98 - 111 mmol/L   CO2 16 (L) 22 - 32 mmol/L   Glucose, Bld 384 (H) 70 - 99 mg/dL   BUN 52 (H) 8 - 23 mg/dL   Creatinine, Ser 4.09 (H) 0.61 - 1.24 mg/dL   Calcium 8.6 (L) 8.9 - 10.3 mg/dL   Total Protein 5.9 (L) 6.5 - 8.1 g/dL   Albumin 2.7 (L) 3.5 - 5.0 g/dL   AST 36 15 - 41 U/L   ALT 36 0 - 44 U/L   Alkaline Phosphatase 183 (H) 38 - 126 U/L   Total Bilirubin 1.1 0.3 - 1.2 mg/dL   GFR, Estimated 24 (L) >60 mL/min   Anion gap 14 5 - 15  Brain natriuretic peptide     Status: Abnormal   Collection Time: 10/01/22  4:18 AM  Result Value Ref Range   B Natriuretic Peptide 3,137.5 (H) 0.0 - 100.0 pg/mL  Glucose, capillary     Status: Abnormal   Collection Time: 10/01/22  4:57 AM  Result Value Ref Range   Glucose-Capillary 331 (H) 70 - 99 mg/dL  Blood gas, arterial     Status: Abnormal (Preliminary result)   Collection Time: 10/01/22  5:35 AM  Result Value Ref Range   FIO2 100 %   Mode PRESSURE REGULATED VOLUME CONTROL    MECHVT 500 mL   PEEP 5 cm H20   pH, Arterial 7.32 (L) 7.35 - 7.45   pCO2 arterial 38 32 - 48 mmHg   pO2, Arterial 32 (LL) 83 - 108 mmHg   Bicarbonate 19.6 (L) 20.0 - 28.0 mmol/L   Acid-base deficit 6.0 (H) 0.0 - 2.0 mmol/L   O2 Saturation PENDING %   Patient temperature 37.0    Collection  site LEFT BRACHIAL    Mechanical Rate 20   APTT     Status: Abnormal   Collection Time: 10/01/22  6:04 AM  Result Value Ref Range   aPTT 57 (H) 24 -  36 seconds  Beta-hydroxybutyric acid     Status: Abnormal   Collection Time: 10/01/22  6:04 AM  Result Value Ref Range   Beta-Hydroxybutyric Acid 0.31 (H) 0.05 - 0.27 mmol/L  Lactic acid, plasma     Status: Abnormal   Collection Time: 10/01/22  6:04 AM  Result Value Ref Range   Lactic Acid, Venous 8.4 (HH) 0.5 - 1.9 mmol/L  Urine Drug Screen, Qualitative ([facility] only)     Status: None   Collection Time: 10/01/22  6:30 AM  Result Value Ref Range   Tricyclic, Ur Screen NONE DETECTED NONE DETECTED   Amphetamines, Ur Screen NONE DETECTED NONE DETECTED   MDMA (Ecstasy)Ur Screen NONE DETECTED NONE DETECTED   Cocaine Metabolite,Ur [place] NONE DETECTED NONE DETECTED   Opiate, Ur Screen NONE DETECTED NONE DETECTED   Phencyclidine (PCP) Ur S NONE DETECTED NONE DETECTED   Cannabinoid 50 Ng, Ur [place] NONE DETECTED NONE DETECTED   Barbiturates, Ur Screen NONE DETECTED NONE DETECTED   Benzodiazepine, Ur Scrn NONE DETECTED NONE DETECTED   Methadone Scn, Ur NONE DETECTED NONE DETECTED  Glucose, capillary     Status: Abnormal   Collection Time: 10/01/22  7:45 AM  Result Value Ref Range   Glucose-Capillary 325 (H) 70 - 99 mg/dL  Cooxemetry Panel (carboxy, met, total hgb, O2 sat)     Status: Abnormal   Collection Time: 10/01/22  8:17 AM  Result Value Ref Range   Total hemoglobin 11.7 (L) 12.0 - 16.0 g/dL   O2 Saturation 53.6 %   Carboxyhemoglobin 0.7 0.5 - 1.5 %   Methemoglobin <0.7 0.0 - 1.5 %   Total oxygen content 42.6 %  Lactic acid, plasma     Status: Abnormal   Collection Time: 10/01/22  8:30 AM  Result Value Ref Range   Lactic Acid, Venous 4.3 (HH) 0.5 - 1.9 mmol/L  MRSA Next Gen by PCR, Nasal     Status: None   Collection Time: 10/01/22  8:30 AM   Specimen: Nasal Mucosa; Nasal Swab  Result Value Ref Range   MRSA by PCR Next Gen NOT DETECTED NOT DETECTED  Troponin I (High Sensitivity)     Status: Abnormal   Collection Time: 10/01/22  8:30 AM  Result Value Ref Range   Troponin I (High  Sensitivity) 3,155 (HH) <18 ng/L   CT CHEST ABDOMEN PELVIS WO CONTRAST  Result Date: 10/01/2022 CLINICAL DATA:  Sepsis. Status post cardiac arrest. Acute respiratory failure on ventilator. EXAM: CT CHEST, ABDOMEN AND PELVIS WITHOUT CONTRAST TECHNIQUE: Multidetector CT imaging of the chest, abdomen and pelvis was performed following the standard protocol without IV contrast. RADIATION DOSE REDUCTION: This exam was performed according to the departmental dose-optimization program which includes automated exposure control, adjustment of the mA and/or kV according to patient size and/or use of iterative reconstruction technique. 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 Mediastinum/Lymph Nodes: Endotracheal tube and nasogastric tube seen in expected position. No masses or pathologically enlarged lymph nodes identified on this unenhanced exam. Lungs/Pleura: Moderate bilateral pleural effusions, without significant change. Compressive atelectasis in both lower lobes. Resolution previously seen ground-glass opacity in both lungs, consistent with resolving  edema or pneumonitis. Musculoskeletal:  No suspicious bone lesions identified. CT ABDOMEN AND PELVIS FINDINGS Hepatobiliary: Stable small fluid attenuation cyst in the anterior liver dome. No masses visualized on this unenhanced exam. Periportal edema again seen. High attenuation gallbladder sludge noted, without signs of cholecystitis or biliary ductal dilatation. Pancreas: No mass or inflammatory changes identified on this unenhanced exam. Spleen:  Within normal limits in size. Adrenals/Urinary Tract: Bilateral renal parenchymal atrophy again noted. A few tiny right renal calculi are seen. No evidence of ureteral calculi or hydronephrosis. Urinary bladder is empty, with Foley catheter in place. Stomach/Bowel:  Nasogastric tube tip in body of stomach. No evidence of obstruction, inflammatory process, or abnormal fluid collections. Diverticulosis is seen mainly involving the sigmoid colon, however there is no evidence of diverticulitis. Vascular/Lymphatic: No pathologically enlarged lymph nodes identified. No abdominal aortic aneurysm. Reproductive:  Moderately enlarged prostate gland Other: Mild-to-moderate diffuse mesenteric and body wall edema. Mild ascites. Previous bilateral inguinal hernia repairs. No No evidence of recurrent hernia Musculoskeletal:  No suspicious bone lesions identified. IMPRESSION: Moderate bilateral pleural effusions and bilateral lower lobe compressive atelectasis, without significant change. Mild ascites and diffuse mesenteric and body wall edema also seen, consistent with anasarca. Resolution of previously seen ground-glass opacity in both lungs, consistent with resolving edema or pneumonitis. 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 Recommend continued follow-up by CT in 1 year. Tiny right renal calculi. No evidence of ureteral calculi or hydronephrosis. Colonic diverticulosis, without radiographic evidence of diverticulitis. Moderately enlarged prostate. Electronically Signed   By: Danae Orleans M.D.   On: 10/01/2022 09:54   CT HEAD WO CONTRAST ( )  Result Date: 10/01/2022 CLINICAL DATA:  Mental status change.  Sepsis and cardiac arrest. EXAM: CT HEAD WITHOUT CONTRAST TECHNIQUE: Contiguous axial images were obtained from the base of the skull through the vertex without intravenous contrast. RADIATION DOSE REDUCTION: This exam was performed according to the departmental dose-optimization program which includes automated exposure control, adjustment of the mA and/or kV according to patient size and/or use of iterative reconstruction technique. COMPARISON:  10/08/2022 FINDINGS: Brain: No evidence of acute infarction, hemorrhage, hydrocephalus,  extra-axial collection or mass lesion/mass effect. Chronic small vessel ischemia is extensive in the cerebral white matter with both patchy white matter low-density and multiple chronic lacunar infarcts at the deep gray nuclei and deep white matter tracks, worse on the right. Generalized brain atrophy Vascular: No hyperdense vessel or unexpected calcification. Skull: Normal. Negative for fracture or focal lesion. Sinuses/Orbits: No acute finding. IMPRESSION: Atrophy and extensive chronic small vessel disease. Electronically Signed   By: Tiburcio Pea M.D.   On: 10/01/2022 09:41   DG Abd 1 View  Result Date: 10/01/2022 CLINICAL DATA:  Encounter for central line EXAM: ABDOMEN - 1 VIEW COMPARISON:  Earlier today FINDINGS: Enteric tube with tip and side port at the stomach. The bowel gas pattern is normal. No concerning mass effect or gas collection where covered. IMPRESSION: Located enteric tube Electronically Signed   By: Tiburcio Pea M.D.   On: 10/01/2022 06:53   DG Chest Port 1 View  Result Date: 10/01/2022 CLINICAL DATA:  Encounter for central line EXAM: PORTABLE CHEST 1 VIEW COMPARISON:  Earlier today FINDINGS: Right IJ line with tip at the SVC. Endotracheal tube with tip just below the clavicular heads. An enteric tube reaches the diaphragm. Hazy density at the bases from pleural fluid, reaching the apices. No superimposed Kerley lines or air bronchogram. No pneumothorax. Normal heart size. IMPRESSION:  1. Unremarkable hardware.  No pneumothorax. 2. Bilateral layering pleural effusion. Electronically Signed   By: Tiburcio Pea M.D.   On: 10/01/2022 06:52   DG Abd 1 View  Result Date: 10/01/2022 CLINICAL DATA:  82 year old male with history of nausea and vomiting. Respiratory distress. EXAM: ABDOMEN - 1 VIEW COMPARISON:  Chest x-ray 05/21/2018. FINDINGS: Gas and stool are seen scattered throughout the colon extending to the level of the distal rectum. No pathologic distension of small bowel is  noted. No gross evidence of pneumoperitoneum. Multiple soft tissue anchors project over the pelvis bilaterally, apparently from prior bilateral inguinal herniorrhaphy. IMPRESSION: 1. Nonobstructive bowel gas pattern. 2. No pneumoperitoneum. Electronically Signed   By: Trudie Reed M.D.   On: 10/01/2022 05:21   DG Chest Port 1 View  Result Date: 10/01/2022 CLINICAL DATA:  82 year old male with history of respiratory distress. Nausea and vomiting. EXAM: PORTABLE CHEST 1 VIEW COMPARISON:  Chest x-ray Oct 17, 2022. FINDINGS: Lung volumes are low. Bibasilar opacities may reflect areas of atelectasis and/or consolidation. Small bilateral pleural effusions (right greater than left). Extensive skin fold artifact projecting over the right hemithorax incidentally noted. No definite pneumothorax confidently identified. No evidence of pulmonary edema. Heart size is normal. The patient is rotated to the left on today's exam, resulting in distortion of the mediastinal contours and reduced diagnostic sensitivity and specificity for mediastinal pathology. Atherosclerotic calcifications are noted in the thoracic aorta. Transcutaneous defibrillator pads project over the left hemithorax. IMPRESSION: 1. Increasing bibasilar areas of atelectasis and/or consolidation with slight increased size of small bilateral pleural effusions (right greater than left). 2. Aortic atherosclerosis. Electronically Signed   By: Trudie Reed M.D.   On: 10/01/2022 05:20   CT Head Wo Contrast  Result Date: Oct 17, 2022 CLINICAL DATA:  Head trauma and chest pain. EXAM: CT HEAD WITHOUT CONTRAST TECHNIQUE: Contiguous axial images were obtained from the base of the skull through the vertex without intravenous contrast. RADIATION DOSE REDUCTION: This exam was performed according to the departmental dose-optimization program which includes automated exposure control, adjustment of the mA and/or kV according to patient size and/or use of iterative  reconstruction technique. COMPARISON:  Head CT 07/01/2022 FINDINGS: Brain: No evidence of acute infarction, hemorrhage, hydrocephalus, extra-axial collection or mass lesion/mass effect. Again seen is mild diffuse atrophy. Small old infarcts are seen in the right basal ganglia. Vascular: There is scattered air seen within the deep soft tissues of the neck and venous structures of the cavernous sinuses. Skull: Normal. Negative for fracture or focal lesion. Sinuses/Orbits: No acute finding. Other: None. IMPRESSION: 1. No acute intracranial abnormality. 2. 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 3. Scattered air within the deep soft tissues of the neck and venous structures of the cavernous sinuses. Please correlate clinically. Electronically Signed   By: Darliss Cheney M.D.   On: 10/17/22 22:59   CT CHEST WO CONTRAST  Result Date: 10-17-22 CLINICAL DATA:  Blunt chest trauma, fall EXAM: CT CHEST WITHOUT CONTRAST TECHNIQUE: Multidetector CT imaging of the chest was performed following the standard protocol without IV contrast. RADIATION DOSE REDUCTION: This exam was performed according to the departmental dose-optimization program which includes automated exposure control, adjustment of the mA and/or kV according to patient size and/or use of iterative reconstruction technique. COMPARISON:  09/14/2021 FINDINGS: Cardiovascular: Cardiac size at the upper limits of normal. Moderate multi-vessel coronary artery calcification. No pericardial effusion. The central pulmonary arteries are enlarged in keeping with changes of pulmonary arterial hypertension. Moderate atherosclerotic calcification is seen within the thoracic  aorta. There is mild dilation of the thoracic aorta measuring 4.2 cm in diameter in its ascending segment, and 3.2 cm in diameter in its proximal and distal descending segment. Mediastinum/Nodes: 15 mm hypodense nodule within the right thyroid gland is not well characterized on this examination, but  appears stable since prior examination. There is punctate subcutaneous gas noted within the visualized neck soft tissues bilaterally and subclavicular regions bilaterally which may relate to trauma given the provided history. Punctate gas within the mediastinum, however, appears intravascular within the brachiocephalic vein and superior vena cava and likely relates to intravenous injection. No pneumomediastinum. Shotty mediastinal adenopathy has developed with index lymph nodes measuring up to 13 mm in short axis diameter which is nonspecific and may be reactive or inflammatory in nature. Low-grade lymphoproliferative processes could appear similarly. This appears new since prior examination. The esophagus is unremarkable. Lungs/Pleura: Moderate bilateral pleural effusions are present with compressive atelectasis of the dependent lungs bilaterally. Mild thickening of the peribronchovascular interstitium may relate to airway inflammation or trace interstitial pulmonary edema. No pneumothorax. No focal consolidation. No central obstructing lesion. Upper Abdomen: Mild ascites noted within the visualized upper abdomen. Punctate nonobstructing calculi noted within the visualized upper pole the right kidney measuring up to 2 mm. No acute abnormality identified. Musculoskeletal: Osseous structures are age-appropriate. No acute bone abnormality. IMPRESSION: 1. Moderate bilateral pleural effusions with compressive atelectasis of the dependent lungs bilaterally. Mild ascites within the visualized upper abdomen. Mild thickening of the peribronchovascular interstitium may reflect trace pulmonary edema and together these findings may relate to changes of cardiogenic failure. 2. Moderate multi-vessel coronary artery calcification. 3. Mild dilation of the thoracic aorta measuring up to 4.2 cm in diameter in its ascending segment. Recommend follow-up ultrasound every 12 months and vascular consultation. 4. Shotty mediastinal  adenopathy, nonspecific and may be reactive or inflammatory in nature. Low-grade lymphoproliferative processes could appear similarly. In absence of interval prior examination, PET CT examination or follow-up CT imaging may be helpful in 3 months to document stability or resolution. 5. Nonobstructing right renal calculi. 6. 15 mm hypodense nodule within the right thyroid gland, not well characterized on this examination, but appears stable since prior examination. If indicated, depending on the patient's comorbidities, this could be better assessed with dedicated thyroid sonography. Aortic Atherosclerosis (ICD10-I70.0). Electronically Signed   By: Helyn Numbers M.D.   On: 10/06/2022 21:55   DG Chest 1 View  Result Date: 09/16/2022 CLINICAL DATA:  Chest pain EXAM: CHEST  1 VIEW COMPARISON:  06/20/2022 FINDINGS: The lungs are symmetrically well inflated. Small bilateral pleural effusions are present, right greater than left. Mild slightly asymmetric perihilar interstitial pulmonary infiltrate is present, new since prior examination, possibly reflecting mild interstitial pulmonary edema or airway inflammation. No pneumothorax. Cardiac size within normal limits. No acute bone abnormality. IMPRESSION: 1. Small bilateral pleural effusions, right greater than left. 2. Mild perihilar interstitial pulmonary infiltrate, possibly reflecting mild interstitial pulmonary edema or airway inflammation. Electronically Signed   By: Helyn Numbers M.D.   On: 09/26/2022 21:43     ASSESSMENT AND PLAN: Severe hypoxia with respiratory failure requiring intubation and status post prolonged CPR for over 10 minutes.  Patient was hypoxic and may have suffered anoxic brain injury.  Thus not a candidate for cardiac catheterization.  Patient peak troponin is 3100 and  is remaining in the same range.  There is no ST elevation on EKG.  I discussed with the family about the options of conservative versus aggressive management of  non-STEMI.   They have opted to agree with conservative management.  Will advise giving IV heparin for 48 hours.  Continue supportive care.  Will assess the patient daily to decide as to further management such as intervention is needed or not.  Kurt Hernandez

## 2022-10-01 NOTE — Assessment & Plan Note (Signed)
-  We will continue Synthroid.

## 2022-10-01 NOTE — Progress Notes (Signed)
eLink Physician-Brief Progress Note Patient Name: Kurt Hernandez DOB: 03/25/1940 MRN: [id number]   Date of Service  10/01/2022  HPI/Events of Note  81/M presenting with fall and syncope at home. Workup consistent with NSTEMI, and he was started on ASA, statin, and admitted to the hospital. Overnight, pt had sudden decompensation, requiring intubation. Shortly after intubation, pt went into cardiac arrest. CPR immediately started, with ROSC achieved ~10 minutes.   Labs Troponin 2,936 to 2,918 to 3,098 BNP 3,137 CT chest without contrast (prior to code): Moderate bilateral effusiosn with compressive atealectasis, mediastinal lymphadenopathy EKG (5:23AM): Junctional tachycardia, QTc 645  Post intubation CXR: increasing areas of atelectasis/consolidations. Bilateral pleural effusions Post code ABG :7.32, pCO2 38, pO2 32, bicarb 19   eICU Interventions  PEA arrest - Likely cardiac in origin.    - Serial EKG, trend troponin to peak   - Continuing therapy for underlying NSTEMI.    - Plan for echo in AM.    - Pt had gone into wide complex tachycardia post code. Started on amiodarone drip.    - Continue vasopressor support - titrate to maintain MAP >65   - Will follow further cardiology plans/recommendations.  - CT head done pre-code had been negative for acute intracranial abnormalities - Will need to reevaluate neurologic status post-code, determine whether he would be an appropriate candidate for TTM. - Currently intubated, on ventilator support   - Maintain on TV 4-2ml/kg PBW, target plateau pressures <30   - Titrate FIO2 and PEEP to maintain Spo2 >90%   - Serial ABG, will make further vent adjustments as necessary   - Monitor off sedation for now to facilitate evaluation of neurologic status - Continue other supportive measures   - Monitor electrolyes, correct abnormalities as appropriate   - Maintain normoglucemia   - Trend lactate - Overall prognosis poor.         Kurt Hernandez M  Kurt Hernandez 10/01/2022, 6:20 AM

## 2022-10-01 NOTE — Consult Note (Signed)
Consultation Note Date: 10/01/2022   Patient Name: Kurt Hernandez  DOB: [birth year]  MRN: [id number]  Age / Sex: 82 y.o., male  PCP: Danella Penton, MD Referring Physician: Janann Colonel, MD  Reason for Consultation: Establishing goals of care   HPI/Brief Hospital Course: 82 y.o. male  with past medical history of HTN, multiple CVA's, urolithiasis, T2DM and pernicious anemia admitted from home on 10/28/2022 with acute onset of midsternal chest pain.  Hospital course has been complicated by PEA arrest (approximately 10 minutes ACLS prior to ROSC), acute decompensated HFrEF with cardiogenic shock with suspected aspiration with concern for possibly anoxic brain injury.   Palliative medicine was consulted for assisting with goals of care conversations.  Subjective:  Extensive chart review has been completed prior to meeting patient including labs, vital signs, imaging, progress notes, orders, and available advanced directive documents from current and previous encounters.  ICU attending held initial GOC conversations with family-decision made at that time to transition to comfort care. Joined in on conversation with wife Kurt Hernandez and son Kurt Hernandez.  Required mechanical ventilation during PEA arrest, remains intubated, no sedation at this time, on levophed and epi being added.  Introduced myself as a Publishing rights manager as a member of the palliative care team. Explained palliative medicine is specialized medical care for people living with serious illness. It focuses on providing relief from the symptoms and stress of a serious illness. The goal is to improve quality of life for both the patient and the family.   Kurt Hernandez shares a brief life review. She and Kurt Hernandez have been married for over 50 years. Kurt Hernandez is their only son, they have a total of 3 grandchildren. 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  Kurt Hernandez spoke of the  importance of quality of life for Kurt Hernandez, she shares being able to care for himself and be outside is important to Kurt Hernandez.  Kurt Hernandez and Kurt Hernandez able to share their understanding of current medical condition. Aware of cardiac arrest, initially attempts were made to transfer to [facility] declined. We discussed two vasopressors supporting his heart to help maintain adequate blood pressure for perfusion. They are aware of concern for brain injury and aware time needed for outcomes.  Kurt Hernandez confirms DNR status, wishes to continue current plan of care at this time.  I discussed importance of continued conversations with family/support persons and all members of their medical team regarding overall plan of care and treatment options ensuring decisions are in alignment with patients goals of care.  All questions/concerns addressed. Emotional support provided to family. PMT will continue to follow and support patient as needed.  Objective: Primary Diagnoses: Present on Admission:  NSTEMI (non-ST elevated myocardial infarction) (HCC)  Acute kidney injury superimposed on chronic kidney disease (HCC)  Type 2 diabetes mellitus with stage 3b chronic kidney disease, without long-term current use of insulin (HCC)  Acquired hypothyroidism   Vital Signs: BP 115/67   Pulse (!) 43   Temp (!) 96.6 F (35.9 C) (Rectal)   Resp 16   Ht 6\' 2"  (1.88 m)   Wt 72.1 kg   SpO2 97%   BMI 20.41 kg/m  Pain Scale: CPOT POSS *See Group Information*: 1-Acceptable,Awake and alert Pain Score: 0-No pain  IO: Intake/output summary:  Intake/Output Summary (Last 24 hours) at 10/01/2022 1912 Last data filed at 10/01/2022 1800 Gross per 24 hour  Intake 3886.75 ml  Output 50 ml  Net 3836.75 ml  LBM: Last BM Date : 09/29/22 Baseline Weight: Weight: 66.2 kg Most recent weight: Weight: 72.1 kg      Discussed With: ICU attending and nursing staff   Thank you for this consult and allowing Palliative  Medicine to participate in the care of Kurt Hernandez. Palliative medicine will continue to follow and assist as needed.   Time Total: 75 minutes  Time spent includes: Detailed review of medical records (labs, imaging, vital signs), medically appropriate exam (mental status, respiratory, cardiac, skin), discussed with treatment team, counseling and educating patient, family and staff, documenting clinical information, medication management and coordination of care.   Signed by: Leeanne Deed, DNP, AGNP-C Palliative Medicine    Please contact Palliative Medicine Team phone at [phone] for questions and concerns.  For individual provider: See Loretha Stapler

## 2022-10-01 NOTE — Assessment & Plan Note (Signed)
-  We will continue Flomax

## 2022-10-01 NOTE — Progress Notes (Signed)
ANTICOAGULATION CONSULT NOTE  Pharmacy Consult for Heparin Infusion Indication: NSTEMI  Allergies  Allergen Reactions   Mirtazapine Other (See Comments)    Other reaction(s): Dizziness-per wife, Patient has been tolerating 7.5 mg without dizziness.    Patient Measurements: Height: 6\' 2"  (188 cm) Weight: 72.1 kg (158 lb 15.2 oz) IBW/kg (Calculated) : 82.2 Heparin Dosing Weight: 66.2 kg  Vital Signs: Temp: 96.1 F (35.6 C) (09/21 1700) Temp Source: Rectal (09/21 1700) BP: 111/66 (09/21 1740) Pulse Rate: 43 (09/21 1740)  Labs: Recent Labs    10-26-22 1952 26-Oct-2022 2225 10/01/22 0057 10/01/22 0416 10/01/22 0418 10/01/22 0604 10/01/22 0830 10/01/22 1128 10/01/22 1251 10/01/22 1614  HGB 12.3*  --   --  12.1*  --   --   --   --   --   --   HCT 36.9*  --   --  36.6*  --   --   --   --   --   --   PLT 266  --   --  269  --   --   --   --   --   --   APTT  --   --  49*  --   --  57*  --   --   --  177*  LABPROT 23.2*  --   --   --   --   --   --   --   --   --   INR 2.0*  --   --   --   --   --   --   --   --   --   HEPARINUNFRC >1.10*  --   --   --   --   --   --   --   --   --   CREATININE 2.42*  --   --   --  2.61*  --   --  2.45* 2.50*  --   TROPONINIHS 2,936*   < >  --   --  3,098*  --  3,155* 3,011*  --   --    < > = values in this interval not displayed.    Estimated Creatinine Clearance: 24 mL/min (A) (by C-G formula based on SCr of 2.5 mg/dL (H)).   Medical History: Past Medical History:  Diagnosis Date   Acute blood loss anemia 03/07/2022   Acute respiratory failure due to COVID-19 (HCC) 09/20/2019   Anemia    pernicious anemia   Arrhythmia    patient unaware of diagnosis except heart skips beats   Cancer (HCC)    lip cancer   Complication of anesthesia    unable to void after surgery and had to stay overnight for several days w/ cath   Diabetes mellitus without complication (HCC)    Headache    History of 2019 novel coronavirus disease (COVID-19)  09/26/2019   Formatting of this note might be different from the original. 9/21   History of CVA (cerebrovascular accident) 10/03/2019   Formatting of this note might be different from the original. Right basal ganglia stroke post Covid, 9/21 Right corona radiata infarct, 7/23 Right lateral thalamus, 8/23   History of kidney stones    Hypertension    Kidney stone    Stroke [facility])    UGIB (upper gastrointestinal bleed) 03/07/2022   Assessment: Kurt Hernandez is a 82 y.o. male presenting with NSTEMI. PMH significant for hypothyroidism, GERD, T2DM, BPH, HLD, HTN. Patient was on  AC PTA per chart review. Last dose of apixaban 5 mg on 9/20 at unknown time. Pharmacy has been consulted to initiate and manage heparin infusion.   Baseline Labs: HL >1.10, PT 23.2, INR 2.0, Hgb 12.3, Hct 36.9, Plt 266   Goal of Therapy:  Heparin level 0.3-0.7 units/ml aPTT 66-102 seconds Monitor platelets by anticoagulation protocol: Yes    Date Time aPTT/HL Rate/Comment 9/21 0604 57/---  900/subtherapeutic 9/21 1614 177/---  1000/supratherapeutic, STAT repeat 9/21 1706 >200/--- 1000/supratherapeutic  Plan:  Hold infusion x1 hour Resume heparin infusion at 900 units/hr after 1 hour hold Check aPTT in 8 hours Continue to monitor aPTT until HL and aPTT correlate.  Check HL daily for correlation until HL and aPTT correlate. Switch to HL monitoring once HL and aPTT correlate.  Continue to monitor H&H and platelets daily while on heparin infusion    Celene Squibb, PharmD Clinical Pharmacist 10/01/2022 6:06 PM

## 2022-10-01 NOTE — Assessment & Plan Note (Signed)
-   The patient will be admitted to a progressive unit bed. - We will continue him on IV heparin. - Will be placed on high-dose statin as well as aspirin. - As needed sublingual nitroglycerin and IV morphine sulfate will be given for pain. - We will add small dose beta-blocker. - Will obtain 2D echo and cardiology consult in AM. - I notified Dr. Welton Flakes (covering [facility]) about the patient.

## 2022-10-01 NOTE — Progress Notes (Signed)
  Echocardiogram 2D Echocardiogram has been performed. Definity IV ultrasound imaging agent used on this study.  Kurt Hernandez 10/01/2022, 2:48 PM

## 2022-10-01 NOTE — Progress Notes (Signed)
Central line being placed

## 2022-10-01 NOTE — Progress Notes (Signed)
Consult placed for USGPIV for vasopressor infusion. ICU staff at bedside and intubation in process. Staff requested that IV team return later for USGPIV placement since pt has patent PIV in place.

## 2022-10-02 ENCOUNTER — Inpatient Hospital Stay: Payer: Medicare Other

## 2022-10-02 DIAGNOSIS — E1122 Type 2 diabetes mellitus with diabetic chronic kidney disease: Secondary | ICD-10-CM | POA: Diagnosis not present

## 2022-10-02 DIAGNOSIS — N179 Acute kidney failure, unspecified: Secondary | ICD-10-CM | POA: Diagnosis not present

## 2022-10-02 DIAGNOSIS — I214 Non-ST elevation (NSTEMI) myocardial infarction: Secondary | ICD-10-CM | POA: Diagnosis not present

## 2022-10-02 DIAGNOSIS — Z515 Encounter for palliative care: Secondary | ICD-10-CM | POA: Diagnosis not present

## 2022-10-02 LAB — URINALYSIS, ROUTINE W REFLEX MICROSCOPIC
Bilirubin Urine: NEGATIVE
Glucose, UA: NEGATIVE mg/dL
Ketones, ur: NEGATIVE mg/dL
Nitrite: NEGATIVE
Protein, ur: 30 mg/dL — AB
RBC / HPF: >50 RBC/hpf (ref 0–5)
Specific Gravity, Urine: 1.019 (ref 1.005–1.030)
pH: 5 (ref 5.0–8.0)

## 2022-10-02 LAB — GLUCOSE, CAPILLARY
Glucose-Capillary: 109 mg/dL — ABNORMAL HIGH (ref 70–99)
Glucose-Capillary: 129 mg/dL — ABNORMAL HIGH (ref 70–99)
Glucose-Capillary: 183 mg/dL — ABNORMAL HIGH (ref 70–99)
Glucose-Capillary: 218 mg/dL — ABNORMAL HIGH (ref 70–99)
Glucose-Capillary: 24 mg/dL — CL (ref 70–99)
Glucose-Capillary: 33 mg/dL — CL (ref 70–99)
Glucose-Capillary: 33 mg/dL — CL (ref 70–99)
Glucose-Capillary: 66 mg/dL — ABNORMAL LOW (ref 70–99)
Glucose-Capillary: 83 mg/dL (ref 70–99)
Glucose-Capillary: 95 mg/dL (ref 70–99)

## 2022-10-02 LAB — BLOOD GAS, VENOUS
Bicarbonate: 9 mmol/L — ABNORMAL LOW (ref 20.0–28.0)
O2 Saturation: 9 mmol/L — ABNORMAL HIGH (ref 0.0–2.0)
Patient temperature: 37
Patient temperature: 37 %
pCO2, Ven: 32 mmHg — ABNORMAL LOW (ref 44–60)
pH, Ven: 7.31 (ref 7.25–7.43)
pO2, Ven: <31 mmol/L — CL (ref 32–45)

## 2022-10-02 LAB — HEPATIC FUNCTION PANEL
ALT: 76 U/L — ABNORMAL HIGH (ref 0–44)
AST: 68 U/L — ABNORMAL HIGH (ref 15–41)
Albumin: 2.4 g/dL — ABNORMAL LOW (ref 3.5–5.0)
Alkaline Phosphatase: 189 U/L — ABNORMAL HIGH (ref 38–126)
Bilirubin, Direct: 0.4 mg/dL — ABNORMAL HIGH (ref 0.0–0.2)
Indirect Bilirubin: 0.6 mg/dL (ref 0.3–0.9)
Total Bilirubin: 1 mg/dL (ref 0.3–1.2)
Total Protein: 5.6 g/dL — ABNORMAL LOW (ref 6.5–8.1)

## 2022-10-02 LAB — COOXEMETRY PANEL
Carboxyhemoglobin: 1.1 % (ref 0.5–1.5)
Carboxyhemoglobin: 1.1 % (ref 0.5–1.5)
Carboxyhemoglobin: 1.1 % (ref 0.5–1.5)
Methemoglobin: <0.7 % (ref 0.0–1.5)
Methemoglobin: <0.7 % (ref 0.0–1.5)
Methemoglobin: 0.7 % (ref 0.0–1.5)
O2 Saturation: 57.3 %
O2 Saturation: 71.3 %
O2 Saturation: 64.9 %
Total hemoglobin: 11.8 g/dL — ABNORMAL LOW (ref 12.0–16.0)
Total hemoglobin: 10.6 g/dL — ABNORMAL LOW (ref 12.0–16.0)
Total hemoglobin: 11.8 g/dL — ABNORMAL LOW (ref 12.0–16.0)
Total oxygen content: 56.7 %
Total oxygen content: 70.2 %
Total oxygen content: 63.7 %

## 2022-10-02 LAB — BLOOD GAS, ARTERIAL
Acid-base deficit: 13.3 mmol/L — ABNORMAL HIGH (ref 0.0–2.0)
Acid-base deficit: 17 mmol/L — ABNORMAL HIGH (ref 0.0–2.0)
Acid-base deficit: 6 mmol/L — ABNORMAL HIGH (ref 0.0–2.0)
Acid-base deficit: 7 mmol/L — ABNORMAL HIGH (ref 0.0–2.0)
Bicarbonate: 11.4 mmol/L — ABNORMAL LOW (ref 20.0–28.0)
Bicarbonate: 11.9 mmol/L — ABNORMAL LOW (ref 20.0–28.0)
Bicarbonate: 17.8 mmol/L — ABNORMAL LOW (ref 20.0–28.0)
Bicarbonate: 19.6 mmol/L — ABNORMAL LOW (ref 20.0–28.0)
FIO2: 0.3 %
FIO2: 100 %
FIO2: 40 %
FIO2: 50 %
MECHVT: 440 mL
MECHVT: 440 mL
MECHVT: 460 mL
MECHVT: 500 mL
Mechanical Rate: 20
Mechanical Rate: 20
Mechanical Rate: 24
O2 Saturation: 97.4 %
O2 Saturation: 97.8 %
O2 Saturation: 99.1 %
PEEP: 5 cmH2O
PEEP: 5 cmH2O
PEEP: 8 cmH2O
PEEP: 8 cmH2O
Patient temperature: 37
Patient temperature: 37
Patient temperature: 37
Patient temperature: 37
Patient temperature: 37 %
RATE: 20 resp/min
pCO2 arterial: 26 mmHg — ABNORMAL LOW (ref 32–48)
pCO2 arterial: 33 mmHg (ref 32–48)
pCO2 arterial: 35 mmHg (ref 32–48)
pCO2 arterial: 38 mmHg (ref 32–48)
pH, Arterial: 7.12 — CL (ref 7.35–7.45)
pH, Arterial: 7.27 — ABNORMAL LOW (ref 7.35–7.45)
pH, Arterial: 7.32 — ABNORMAL LOW (ref 7.35–7.45)
pH, Arterial: 7.34 — ABNORMAL LOW (ref 7.35–7.45)
pO2, Arterial: 109 mmHg — ABNORMAL HIGH (ref 83–108)
pO2, Arterial: 32 mmHg — CL (ref 83–108)
pO2, Arterial: 89 mmHg (ref 83–108)
pO2, Arterial: 94 mmHg (ref 83–108)

## 2022-10-02 LAB — CBC
HCT: 34.7 % — ABNORMAL LOW (ref 39.0–52.0)
Hemoglobin: 11.4 g/dL — ABNORMAL LOW (ref 13.0–17.0)
MCH: 30.6 pg (ref 26.0–34.0)
MCHC: 32.9 g/dL (ref 30.0–36.0)
MCV: 93.3 fL (ref 80.0–100.0)
Platelets: 305 10*3/uL (ref 150–400)
RBC: 3.72 MIL/uL — ABNORMAL LOW (ref 4.22–5.81)
RDW: 14 % (ref 11.5–15.5)
WBC: 16.1 10*3/uL — ABNORMAL HIGH (ref 4.0–10.5)
nRBC: 0 % (ref 0.0–0.2)

## 2022-10-02 LAB — BASIC METABOLIC PANEL
Anion gap: 8 (ref 5–15)
Anion gap: 14 (ref 5–15)
Anion gap: 19 — ABNORMAL HIGH (ref 5–15)
BUN: 55 mg/dL — ABNORMAL HIGH (ref 8–23)
BUN: 56 mg/dL — ABNORMAL HIGH (ref 8–23)
BUN: 61 mg/dL — ABNORMAL HIGH (ref 8–23)
CO2: 17 mmol/L — ABNORMAL LOW (ref 22–32)
CO2: 16 mmol/L — ABNORMAL LOW (ref 22–32)
CO2: 14 mmol/L — ABNORMAL LOW (ref 22–32)
Calcium: 8.4 mg/dL — ABNORMAL LOW (ref 8.9–10.3)
Calcium: 8.4 mg/dL — ABNORMAL LOW (ref 8.9–10.3)
Calcium: 8.5 mg/dL — ABNORMAL LOW (ref 8.9–10.3)
Chloride: 110 mmol/L (ref 98–111)
Chloride: 105 mmol/L (ref 98–111)
Chloride: 102 mmol/L (ref 98–111)
Creatinine, Ser: 2.63 mg/dL — ABNORMAL HIGH (ref 0.61–1.24)
Creatinine, Ser: 2.96 mg/dL — ABNORMAL HIGH (ref 0.61–1.24)
Creatinine, Ser: 3.18 mg/dL — ABNORMAL HIGH (ref 0.61–1.24)
GFR, Estimated: 24 mL/min — ABNORMAL LOW (ref >60)
GFR, Estimated: 21 mL/min — ABNORMAL LOW (ref >60)
GFR, Estimated: 19 mL/min — ABNORMAL LOW (ref >60)
Glucose, Bld: 111 mg/dL — ABNORMAL HIGH (ref 70–99)
Glucose, Bld: 153 mg/dL — ABNORMAL HIGH (ref 70–99)
Glucose, Bld: 211 mg/dL — ABNORMAL HIGH (ref 70–99)
Potassium: 4.9 mmol/L (ref 3.5–5.1)
Potassium: 5.4 mmol/L — ABNORMAL HIGH (ref 3.5–5.1)
Potassium: 5.6 mmol/L — ABNORMAL HIGH (ref 3.5–5.1)
Sodium: 135 mmol/L (ref 135–145)
Sodium: 135 mmol/L (ref 135–145)
Sodium: 135 mmol/L (ref 135–145)

## 2022-10-02 LAB — MAGNESIUM
Magnesium: 3.2 mg/dL — ABNORMAL HIGH (ref 1.7–2.4)
Magnesium: 2.9 mg/dL — ABNORMAL HIGH (ref 1.7–2.4)

## 2022-10-02 LAB — RENAL FUNCTION PANEL
Albumin: 2.5 g/dL — ABNORMAL LOW (ref 3.5–5.0)
Anion gap: 9 (ref 5–15)
BUN: 54 mg/dL — ABNORMAL HIGH (ref 8–23)
CO2: 16 mmol/L — ABNORMAL LOW (ref 22–32)
Calcium: 8.3 mg/dL — ABNORMAL LOW (ref 8.9–10.3)
Chloride: 108 mmol/L (ref 98–111)
Creatinine, Ser: 2.71 mg/dL — ABNORMAL HIGH (ref 0.61–1.24)
GFR, Estimated: 23 mL/min — ABNORMAL LOW (ref >60)
Glucose, Bld: 104 mg/dL — ABNORMAL HIGH (ref 70–99)
Phosphorus: 4.7 mg/dL — ABNORMAL HIGH (ref 2.5–4.6)
Potassium: 4.9 mmol/L (ref 3.5–5.1)
Sodium: 133 mmol/L — ABNORMAL LOW (ref 135–145)

## 2022-10-02 LAB — SURGICAL PCR SCREEN
MRSA, PCR: POSITIVE — AB
Staphylococcus aureus: POSITIVE — AB

## 2022-10-02 LAB — LACTIC ACID, PLASMA
Lactic Acid, Venous: 1.1 mmol/L (ref 0.5–1.9)
Lactic Acid, Venous: 1.3 mmol/L (ref 0.5–1.9)
Lactic Acid, Venous: 3 mmol/L (ref 0.5–1.9)
Lactic Acid, Venous: 3.9 mmol/L (ref 0.5–1.9)

## 2022-10-02 LAB — PROCALCITONIN: Procalcitonin: 1.7 ng/mL

## 2022-10-02 LAB — APTT
aPTT: 68 seconds — ABNORMAL HIGH (ref 24–36)
aPTT: 72 seconds — ABNORMAL HIGH (ref 24–36)

## 2022-10-02 LAB — HEPARIN LEVEL (UNFRACTIONATED): Heparin Unfractionated: >1.1 IU/mL — ABNORMAL HIGH (ref 0.30–0.70)

## 2022-10-02 LAB — TRIGLYCERIDES: Triglycerides: 20 mg/dL (ref <150)

## 2022-10-02 LAB — PHOSPHORUS: Phosphorus: 6 mg/dL — ABNORMAL HIGH (ref 2.5–4.6)

## 2022-10-02 MED ORDER — ALBUMIN HUMAN 25 % IV SOLN
12.5000 g | Freq: Once | INTRAVENOUS | Status: AC
  Administered 2022-10-02: 12.5 g via INTRAVENOUS
  Filled 2022-10-02: qty 50

## 2022-10-02 MED ORDER — HYDROCORTISONE SOD SUC (PF) 100 MG IJ SOLR
50.0000 mg | Freq: Three times a day (TID) | INTRAMUSCULAR | Status: DC
  Administered 2022-10-02: 50 mg via INTRAVENOUS
  Filled 2022-10-02: qty 2

## 2022-10-02 MED ORDER — LACTATED RINGERS IV BOLUS
500.0000 mL | Freq: Once | INTRAVENOUS | Status: AC
  Administered 2022-10-02: 500 mL via INTRAVENOUS

## 2022-10-02 MED ORDER — MUPIROCIN 2 % EX OINT
1.0000 | TOPICAL_OINTMENT | Freq: Two times a day (BID) | CUTANEOUS | Status: DC
  Administered 2022-10-02: 1 via NASAL
  Filled 2022-10-02: qty 22

## 2022-10-02 MED ORDER — VANCOMYCIN HCL 500 MG/100ML IV SOLN
500.0000 mg | Freq: Once | INTRAVENOUS | Status: AC
  Administered 2022-10-02: 500 mg via INTRAVENOUS
  Filled 2022-10-02: qty 100

## 2022-10-02 MED ORDER — DEXTROSE 50 % IV SOLN
INTRAVENOUS | Status: AC
  Administered 2022-10-02: 50 mL
  Filled 2022-10-02: qty 50

## 2022-10-02 MED ORDER — MIDAZOLAM-SODIUM CHLORIDE 100-0.9 MG/100ML-% IV SOLN
INTRAVENOUS | Status: AC
  Administered 2022-10-02: 0.5 mg/h via INTRAVENOUS
  Filled 2022-10-02: qty 100

## 2022-10-02 MED ORDER — LACTATED RINGERS IV BOLUS
250.0000 mL | Freq: Once | INTRAVENOUS | Status: AC
  Administered 2022-10-02: 250 mL via INTRAVENOUS

## 2022-10-02 MED ORDER — SODIUM BICARBONATE 8.4 % IV SOLN
50.0000 meq | Freq: Once | INTRAVENOUS | Status: AC
  Administered 2022-10-02: 50 meq via INTRAVENOUS

## 2022-10-02 MED ORDER — VITAL AF 1.2 CAL PO LIQD: 1000.0000 mL | ORAL | Status: DC

## 2022-10-02 MED ORDER — LEVOTHYROXINE SODIUM 100 MCG/5ML IV SOLN: 75.0000 ug | Freq: Every day | INTRAVENOUS | Status: DC

## 2022-10-02 MED ORDER — PIPERACILLIN-TAZOBACTAM 3.375 G IVPB
3.3750 g | Freq: Three times a day (TID) | INTRAVENOUS | Status: DC
  Administered 2022-10-02: 3.375 g via INTRAVENOUS
  Filled 2022-10-02 (×2): qty 50

## 2022-10-02 MED ORDER — PIPERACILLIN-TAZOBACTAM 3.375 G IVPB 30 MIN: 3.3750 g | Freq: Three times a day (TID) | INTRAVENOUS | Status: DC

## 2022-10-02 MED ORDER — CHLORHEXIDINE GLUCONATE CLOTH 2 % EX PADS: 6.0000 | MEDICATED_PAD | Freq: Every day | CUTANEOUS | Status: DC

## 2022-10-02 MED ORDER — PRISMASOL BGK 0/2.5 32-2.5 MEQ/L EC SOLN
Status: DC
  Filled 2022-10-02: qty 5000

## 2022-10-02 MED ORDER — PRISMASOL BGK 0/2.5 32-2.5 MEQ/L EC SOLN
Status: DC
  Filled 2022-10-02 (×5): qty 5000

## 2022-10-02 MED ORDER — VANCOMYCIN HCL IN DEXTROSE 1-5 GM/200ML-% IV SOLN: 1000.0000 mg | INTRAVENOUS | Status: DC

## 2022-10-02 MED ORDER — EPINEPHRINE HCL 5 MG/250ML IV SOLN IN NS
0.5000 ug/min | INTRAVENOUS | Status: DC
  Administered 2022-10-02: 0.5 ug/min via INTRAVENOUS
  Filled 2022-10-02: qty 250

## 2022-10-02 MED ORDER — SODIUM CHLORIDE 0.9 % FOR CRRT: INTRAVENOUS_CENTRAL | Status: DC | PRN

## 2022-10-02 MED ORDER — SODIUM BICARBONATE 8.4 % IV SOLN
INTRAVENOUS | Status: AC
  Filled 2022-10-02: qty 50

## 2022-10-02 MED ORDER — POTASSIUM CHLORIDE 10 MEQ/50ML IV SOLN
10.0000 meq | INTRAVENOUS | Status: AC
  Administered 2022-10-02 (×4): 10 meq via INTRAVENOUS
  Filled 2022-10-02 (×4): qty 50

## 2022-10-02 MED ORDER — SODIUM BICARBONATE 8.4 % IV SOLN
100.0000 meq | Freq: Once | INTRAVENOUS | Status: AC
  Administered 2022-10-02: 100 meq via INTRAVENOUS

## 2022-10-02 MED ORDER — DEXTROSE 10 % IV SOLN: INTRAVENOUS | Status: DC

## 2022-10-02 MED ORDER — HEPARIN SODIUM (PORCINE) 1000 UNIT/ML DIALYSIS: 1000.0000 [IU] | INTRAMUSCULAR | Status: DC | PRN

## 2022-10-02 MED ORDER — MIDAZOLAM-SODIUM CHLORIDE 100-0.9 MG/100ML-% IV SOLN: 0.5000 mg/h | INTRAVENOUS | Status: DC

## 2022-10-02 MED ORDER — MAGNESIUM SULFATE 2 GM/50ML IV SOLN
2.0000 g | Freq: Once | INTRAVENOUS | Status: AC
  Administered 2022-10-02: 2 g via INTRAVENOUS
  Filled 2022-10-02: qty 50

## 2022-10-02 MED ORDER — HYDROCORTISONE SOD SUC (PF) 100 MG IJ SOLR
100.0000 mg | Freq: Three times a day (TID) | INTRAMUSCULAR | Status: DC
  Administered 2022-10-02: 100 mg via INTRAVENOUS

## 2022-10-02 MED ORDER — SODIUM CHLORIDE 0.9 % IV SOLN
3.0000 g | Freq: Two times a day (BID) | INTRAVENOUS | Status: DC
  Administered 2022-10-02: 3 g via INTRAVENOUS
  Filled 2022-10-02 (×2): qty 8

## 2022-10-02 MED ORDER — LACTATED RINGERS IV BOLUS: 500.0000 mL | Freq: Once | INTRAVENOUS | Status: DC

## 2022-10-02 MED ORDER — VASOPRESSIN 20 UNITS/100 ML INFUSION FOR SHOCK
0.0000 [IU]/min | INTRAVENOUS | Status: DC
  Administered 2022-10-02 (×2): 0.04 [IU]/min via INTRAVENOUS
  Administered 2022-10-02: 0.03 [IU]/min via INTRAVENOUS
  Filled 2022-10-02 (×3): qty 100

## 2022-10-02 MED ORDER — FUROSEMIDE 10 MG/ML IJ SOLN
10.0000 mg/h | INTRAVENOUS | Status: DC
  Administered 2022-10-02: 4 mg/h via INTRAVENOUS
  Filled 2022-10-02 (×2): qty 20

## 2022-10-02 MED ORDER — VECURONIUM BROMIDE 10 MG IV SOLR
10.0000 mg | INTRAVENOUS | Status: DC | PRN
  Administered 2022-10-02: 10 mg via INTRAVENOUS
  Filled 2022-10-02 (×2): qty 10

## 2022-10-02 MED ORDER — SODIUM BICARBONATE 8.4 % IV SOLN
INTRAVENOUS | Status: AC
  Filled 2022-10-02: qty 100

## 2022-10-02 NOTE — Progress Notes (Signed)
Addendum Patient's clinical condition has worsened.  He is acidotic with bicarbonate of 14 and has developed hyperkalemia with potassium of 5.6.  Creatinine has further worsened to 3.18. Lactic acid increasing at 3.9. Case discussed with ICU team, who is requesting initiation of CRRT. Orders placed.

## 2022-10-02 NOTE — Progress Notes (Signed)
Subjective:  ON: Epinephrine weaned off, Vasopressin added. Furosemide drip started. Patient with improved mentation, moving all four extremities, not following commands.  Objective BP: 114/69 NE 28mcg/min Vasopressin 0.04.  HR: 85bpm  Sat 100% on PRVC 440-8-30%-20  Physical exam GEN intubated sedated. HEENT supple neck, reactive pupils CVS Sinus rhytm, Normal S1, Normal S2.  Pulmonary: Coarse breath sounds bilaterally GI soft nontender nondistended positive bowel sounds Extremities warm no edema   Labs and imaging were reviewed.    Assessment and plan Case of an 82 year old male patient with a past medical history of type 2 diabetes mellitus CVAs in the past presenting with NSTEMI course complicated by cardiogenic shock hypoxic respiratory failure requiring intubation mechanical ventilation on 09/20 and PEA arrest with downtime 10 minutes.   # PEA arrest secondary to # Acute hypoxic respiratory failure in the setting of # Cardiogenic shock with central venous sat 42% bedside ultrasound with severely reduced LV function secondary to # NSTEMI with troponin peaking at 3100 # Possible aspiration pneumonia  # Type 2 diabetes mellitus # Oliguric AKI secondary to hypoperfusion with possible ATN vs cardiorenal. #Hypoxic metabolic encephalopathy.    CNS: Add versed drip. Fentanyl drip for sleep at less than 2. D/C TTM given regain of mental status.  CVS: Norepinephrine and Vaso at 0.04 for MAP greater than 65.  Aspirin 81 mg daily.  Atorvastatin 80 mg daily.  Heparin drip ACS protocol.  Will touch base with cardiology regarding addition of second antiplatelet. Lasix drip at 5mg /hr. Coox panel depending on results consider inotropic support.  Pulmonary: Continue vent support for sats 92 and above. GI: Keep NPO.  Pantoprazole for GI prophylaxis. Renal: Monitor UOP.  MAP greater than 65 support perfusion.  Creatinine currently at 2.7 mg/dL from a baseline of 0.45 mg/dL.  No current indication  for emergent dialysis. Lasix drip for net neg -2L in 24hrs.  Heme: On heparin drip for ACS protocol.  Endo: Glucose within range.   Goals of care: Had extensive goals of care with his wife and his son who were present at bedside.  I explained to them his overall picture with ongoing severe cardiogenic shock and severe reduced cardiac function that led to his PEA arrest with possible hypoxic brain injury.  I explained to them that with his age and what he endured the chances of recovery are difficult.  I also discussed the case with cardiology at [facility] for mechanical cardiac support however it was felt he was a poor candidate.  With overall picture being poor we decided to switch his CODE STATUS to DNR.   I spent 45 minutes caring for this patient today, including preparing to see the patient, obtaining a medical history , reviewing a separately obtained history, performing a medically appropriate examination and/or evaluation, counseling and educating the patient/family/caregiver, ordering medications, tests, or procedures, referring and communicating with other health care professionals (not separately reported), documenting clinical information in the electronic health record, independently interpreting results (not separately reported/billed) and communicating results to the patient/family/caregiver, and care coordination (not separately reported/billed)   Janann Colonel, MD [facility] 10/01/2022 1:52 PM

## 2022-10-02 NOTE — Progress Notes (Signed)
ANTICOAGULATION CONSULT NOTE  Pharmacy Consult for Heparin Infusion Indication: NSTEMI  Allergies  Allergen Reactions   Mirtazapine Other (See Comments)    Other reaction(s): Dizziness-per wife, Patient has been tolerating 7.5 mg without dizziness.    Patient Measurements: Height: 6\' 2"  (188 cm) Weight: 72.1 kg (158 lb 15.2 oz) IBW/kg (Calculated) : 82.2 Heparin Dosing Weight: 66.2 kg  Vital Signs: Temp: 98.2 F (36.8 C) (09/22 0400) Temp Source: Rectal (09/22 0400) BP: 80/59 (09/22 0400) Pulse Rate: 34 (09/22 0400)  Labs: Recent Labs    10-22-22 1952 October 22, 2022 2225 10/01/22 0416 10/01/22 0418 10/01/22 0604 10/01/22 0830 10/01/22 1128 10/01/22 1251 10/01/22 1614 10/01/22 1706 10/01/22 1834 10/02/22 0455  HGB 12.3*  --  12.1*  --   --   --   --   --   --   --   --  11.4*  HCT 36.9*  --  36.6*  --   --   --   --   --   --   --   --  34.7*  PLT 266  --  269  --   --   --   --   --   --   --   --  305  APTT  --    < >  --   --  57*  --   --   --  177* >200*  --   --   LABPROT 23.2*  --   --   --   --   --   --   --   --   --   --   --   INR 2.0*  --   --   --   --   --   --   --   --   --   --   --   HEPARINUNFRC >1.10*  --   --   --   --   --   --   --   --   --   --  >1.10*  CREATININE 2.42*  --   --  2.61*  --   --  2.45* 2.50*  --   --  2.57* 2.63*  TROPONINIHS 2,936*   < >  --  3,098*  --  3,155* 3,011*  --   --   --   --   --    < > = values in this interval not displayed.    Estimated Creatinine Clearance: 22.8 mL/min (A) (by C-G formula based on SCr of 2.63 mg/dL (H)).   Medical History: Past Medical History:  Diagnosis Date   Acute blood loss anemia 03/07/2022   Acute respiratory failure due to COVID-19 [facility]) 09/20/2019   Anemia    pernicious anemia   Arrhythmia    patient unaware of diagnosis except heart skips beats   Cancer (HCC)    lip cancer   Complication of anesthesia    unable to void after surgery and had to stay overnight for several  days w/ cath   Diabetes mellitus without complication (HCC)    Headache    History of 2019 novel coronavirus disease (COVID-19) 09/26/2019   Formatting of this note might be different from the original. 9/21   History of CVA (cerebrovascular accident) 10/03/2019   Formatting of this note might be different from the original. Right basal ganglia stroke post Covid, 9/21 Right corona radiata infarct, 7/23 Right lateral thalamus, 8/23   History of kidney stones  Hypertension    Kidney stone    Stroke [facility])    UGIB (upper gastrointestinal bleed) 03/07/2022   Assessment: FLAVEL COSTNER is a 82 y.o. male presenting with NSTEMI. PMH significant for hypothyroidism, GERD, T2DM, BPH, HLD, HTN. Patient was on [facility] PTA per chart review. Last dose of apixaban 5 mg on 9/20 at unknown time. Pharmacy has been consulted to initiate and manage heparin infusion.   Baseline Labs: HL >1.10, PT 23.2, INR 2.0, Hgb 12.3, Hct 36.9, Plt 266   Goal of Therapy:  Heparin level 0.3-0.7 units/ml aPTT 66-102 seconds Monitor platelets by anticoagulation protocol: Yes    Date Time aPTT/HL Rate/Comment 9/21 0604 57/---  900/subtherapeutic 9/21 1614 177/---  1000/supratherapeutic, STAT repeat 9/21 1706 >200/--- 1000/supratherapeutic 9/22 0455 68 / >1.1 900/therapeutic x 1  Plan:  Continue heparin infusion at 900 units/hr Recheck aPTT in 8 hours to confirm Continue to monitor aPTT until HL and aPTT correlate.  Check HL daily for correlation until HL and aPTT correlate. Switch to HL monitoring once HL and aPTT correlate.  Continue to monitor H&H and platelets daily while on heparin infusion    Otelia Sergeant, PharmD, [facility] 10/02/2022 5:55 AM

## 2022-10-02 NOTE — Consult Note (Addendum)
Pharmacy Antibiotic Note  Kurt Hernandez is a 82 y.o. male admitted on 09/14/2022 with  syncope at home . PMH significant for hypothyroidism, GERD, T2DM, BPH, HLD, HTN. Workup consistent with NSTEMI but CCMD has concern for sepsis with possible pneumonia. Pharmacy has been consulted for vancomycin dosing.  Plan: Day 2 of antibiotics Addendum: Patient now on CRRT. Start vancomycin 1000 mg IV daily. Patient is also on Zosyn 3.375 g IV Q8H Continue to monitor renal function and follow culture results   Height: 6\' 2"  (188 cm) Weight: 72.1 kg (158 lb 15.2 oz) IBW/kg (Calculated) : 82.2  Temp (24hrs), Avg:98.5 F (36.9 C), Min:96.1 F (35.6 C), Max:99.9 F (37.7 C)  Recent Labs  Lab 10/02/2022 1952 10/01/22 0416 10/01/22 0418 10/01/22 0604 10/01/22 0830 10/01/22 1128 10/01/22 1251 10/01/22 1834 10/02/22 0018 10/02/22 0454 10/02/22 0455  WBC 11.7* 10.4  --   --   --   --   --   --   --   --  16.1*  CREATININE 2.42*  --  2.61*  --   --  2.45* 2.50* 2.57*  --   --  2.71*  2.63*  LATICACIDVEN  --   --   --    < > 4.3*  --  2.9* 1.6 1.1 1.3  --    < > = values in this interval not displayed.    Estimated Creatinine Clearance: 22.8 mL/min (A) (by C-G formula based on SCr of 2.63 mg/dL (H)).    Allergies  Allergen Reactions   Mirtazapine Other (See Comments)    Other reaction(s): Dizziness-per wife, Patient has been tolerating 7.5 mg without dizziness.    Antimicrobials this admission: 9/21 Vancomycin x1 9/21 Cefepime  >> 9/22 9/22 Unasyn x1 9/22 Zosyn >> 9/22 Vancomycin >>  Dose adjustments this admission: N/A  Microbiology results: 9/21 BCx: NG1D 9/21 Sputum: few GPC 9/21 MRSA PCR: negative 9/22 BCx: ordered  Thank you for allowing pharmacy to be a part of this patient's care.  Celene Squibb, PharmD Clinical Pharmacist 10/02/2022 4:24 PM

## 2022-10-02 NOTE — Procedures (Signed)
Patient Name: Kurt Hernandez   MRN: [id number]   Date of Birth/ Sex: 10/31/1940 , male      Admission Date: 10/08/2022  Attending Provider: Janann Colonel, MD  Primary Diagnosis: NSTEMI (non-ST elevated myocardial infarction) [employer])    PROCEDURE: U/S Guided Internal jugular central venous catheter   INDICATION (S): Hemodialysis  PROCEDURE OPERATOR: Webb Silversmith NP  CONSENT: Risks of the procedure as well as the alternatives and risks of each were explained to the patient and/or caregiver.  Consent for the procedure was obtained and is signed in the bedside chart  PROCEDURE SUMMARY: A time-out was performed. The patient's <RIGHT> femoral region was prepped and draped in sterile fashion using chlorhexidine scrub. Anesthesia was achieved with 1% lidocaine. The <RIGHT> iFemoral vein was accessed under ultrasound guidance using a finder needle and sheath. Venous blood was withdrawn and the sheath was advanced into the vein and the needle was withdrawn. A guidewire was advanced through the sheath. A small incision was made with a 10-blade scalpel and the sheath was exchanged for a dilator over the guidewire until appropriate dilation was obtained. The dilator was removed and an 21 French central venous triple-lumen catheter was advanced over the guidewire and secured into place with 4 sutures at <30> cm. At time of procedure completion, all ports aspirated and flushed properly. Post-procedure x-ray shows the tip of the catheter within the superior vena cava.  COMPLICATIONS: None; patient tolerated the procedure well. Chest X-ray is ordered to verify placement for internal jugular or subclavian cannulation.   Chest x-ray is not ordered for femoral cannulation.  ESTIMATED BLOOD LOSS None   Webb Silversmith, DNP, CCRN, FNP-C, AGACNP-BC Acute Care & Family Nurse Practitioner  [facility]  See Amion for personal pager PCCM on call pager [phone] until 7  am

## 2022-10-02 NOTE — Progress Notes (Signed)
Daily Progress Note   Patient Name: Kurt Hernandez       Date: 10/02/2022 DOB: 15-Jan-1940  Age: 82 y.o. MRN#: [id number] Attending Physician: Janann Colonel, MD Primary Care Physician: Danella Penton, MD Admit Date: 09/27/2022  Reason for Consultation/Follow-up: Establishing goals of care  HPI/Brief Hospital Review:  82 y.o. male  with past medical history of HTN, multiple CVA's, urolithiasis, T2DM and pernicious anemia admitted from home on 09/17/2022 with acute onset of midsternal chest pain.   Hospital course has been complicated by PEA arrest (approximately 10 minutes ACLS prior to ROSC), acute decompensated HFrEF with cardiogenic shock with suspected aspiration with concern for possibly anoxic brain injury.    Palliative medicine was consulted for assisting with goals of care conversations.   Subjective: Extensive chart review has been completed prior to meeting patient including labs, vital signs, imaging, progress notes, orders, and available advanced directive documents from current and previous encounters.    Visited at bedside with Kurt Hernandez in collaboration with ICU attending. Mr. Shiao moving all extremities and attempts to open eyes to his name being called, sedation started overnight.  Wife, son and daughter in law at bedside during time of visit. ICU attending provided most recent medical updates. At this time seeing small improvements, Ms. Stetzer remains critically ill.  Plan at this time is to continue to provide time for outcomes. Son and wife anticipating making decision regarding continuing with current plan of care versus shifting to comfort care.  PMT to continue to follow for ongoing needs and support.   Care plan was discussed with ICU team and  nursing staff.  Thank you for allowing the Palliative Medicine Team to assist in the care of this patient.  Total time:  25 minutes  Time spent includes: Detailed review of medical records (labs, imaging, vital signs), medically appropriate exam (mental status, respiratory, cardiac, skin), discussed with treatment team, counseling and educating patient, family and staff, documenting clinical information, medication management and coordination of care.  Leeanne Deed, DNP, AGNP-C Palliative Medicine   Please contact Palliative Medicine Team phone at [phone] for questions and concerns.

## 2022-10-02 NOTE — Consult Note (Signed)
PHARMACY CONSULT NOTE - ELECTROLYTES  Pharmacy Consult for Electrolyte Monitoring and Replacement   Recent Labs: Height: 6\' 2"  (188 cm) Weight: 72.1 kg (158 lb 15.2 oz) IBW/kg (Calculated) : 82.2 Estimated Creatinine Clearance: 22.8 mL/min (A) (by C-G formula based on SCr of 2.63 mg/dL (H)). Potassium (mmol/L)  Date Value  10/02/2022 4.9  10/02/2022 4.9   Magnesium (mg/dL)  Date Value  81/19/1478 3.2 (H)   Calcium (mg/dL)  Date Value  29/56/2130 8.4 (L)  10/02/2022 8.3 (L)   Albumin (g/dL)  Date Value  86/57/8469 2.4 (L)  10/02/2022 2.5 (L)   Phosphorus (mg/dL)  Date Value  62/95/2841 4.7 (H)   Sodium (mmol/L)  Date Value  10/02/2022 135  10/02/2022 133 (L)  10/11/2017 142   Corrected Ca: 9.68 mg/dL  Assessment  Kurt Hernandez is a 82 y.o. male presenting with acute onset mid-sternal chest pain. PMH significant for hypertension, type 2 diabetes mellitus, pernicious anemia, CVAs in the past . Pharmacy has been consulted to monitor and replace electrolytes.  Diet: ventilated, no tube feeds currently ordered MIVF: D10 @ 30 mL/hr Pertinent medications: furosemide gtt@4mg /hr  Goal of Therapy: Electrolytes WNL  Plan:  No replacement currently indicated Check BMP, Mg, Phos with AM labs  Thank you for allowing pharmacy to be a part of this patient's care.  Bettey Costa, PharmD Clinical Pharmacist 10/02/2022 7:48 AM

## 2022-10-02 NOTE — Plan of Care (Signed)
Continuing with plan of care. 

## 2022-10-02 NOTE — Progress Notes (Signed)
Patient's B/P continued to decrease, Dr. West Bali ordered LR bolus of 

## 2022-10-02 NOTE — Consult Note (Signed)
Pharmacy Antibiotic Note  Kurt Hernandez is a 82 y.o. male admitted on 10/04/2022 with  syncope at home .  Workup consistent with NSTEMI but CCMD has concern for sepsis with possible pneumonia. Pharmacy has been consulted for Vancomycin and Cefepime dosing.  9/22 Transition Cefepime to Unasyn for aspiration pneumonia.  Plan: Vancomycin 1500mg  IV x 1 ordered as loading dose. Due to AKI, will defer scheduled dosing and use random levels to help assess dosing regimen. Will re-assess in AM on 9/22  Unasyn 3 gm q12h for 5 day per indication and renal fxn.  Height: 6\' 2"  (188 cm) Weight: 72.1 kg (158 lb 15.2 oz) IBW/kg (Calculated) : 82.2  Temp (24hrs), Avg:95.8 F (35.4 C), Min:91.4 F (33 C), Max:98.8 F (37.1 C)  Recent Labs  Lab 09/22/2022 1952 10/01/22 0416 10/01/22 0418 10/01/22 0604 10/01/22 0830 10/01/22 1128 10/01/22 1251 10/01/22 1834 10/02/22 0018  WBC 11.7* 10.4  --   --   --   --   --   --   --   CREATININE 2.42*  --  2.61*  --   --  2.45* 2.50* 2.57*  --   LATICACIDVEN  --   --   --  8.4* 4.3*  --  2.9* 1.6 1.1    Estimated Creatinine Clearance: 23.4 mL/min (A) (by C-G formula based on SCr of 2.57 mg/dL (H)).    Allergies  Allergen Reactions   Mirtazapine Other (See Comments)    Other reaction(s): Dizziness-per wife, Patient has been tolerating 7.5 mg without dizziness.    Antimicrobials this admission: Vancomycin 9/21 >>  Cefepime 9/21 >> 9/22 Unasyn 9/22 >> x 5 days  Dose adjustments this admission: N/A  Microbiology results: 9/21 BCx: collected 9/21 MRSA PCR: pending  Thank you for allowing pharmacy to be a part of this patient's care.  Otelia Sergeant, PharmD, [facility] 10/02/2022 3:59 AM

## 2022-10-02 NOTE — Progress Notes (Signed)
SUBJECTIVE: Patient is intubated and unresponsive with family at bedside.   Vitals:   10/02/22 0850 10/02/22 0900 10/02/22 0930 10/02/22 1148  BP:  (!) 123/91 115/74   Pulse:      Resp: (!) 23 20 19    Temp: 98.6 F (37 C) 98.6 F (37 C) 98.8 F (37.1 C)   TempSrc: Rectal Rectal Rectal   SpO2: 99% 99% 99% 99%  Weight:      Height:        Intake/Output Summary (Last 24 hours) at 10/02/2022 1241 Last data filed at 10/02/2022 1100 Gross per 24 hour  Intake 2857.53 ml  Output 270 ml  Net 2587.53 ml    LABS: Basic Metabolic Panel: Recent Labs    10/01/22 1614 10/01/22 1834 10/02/22 0455  NA  --  137 133*  135  K  --  4.0 4.9  4.9  CL  --  105 108  110  CO2  --  22 16*  17*  GLUCOSE  --  147* 104*  111*  BUN  --  57* 54*  55*  CREATININE  --  2.57* 2.71*  2.63*  CALCIUM  --  8.4* 8.3*  8.4*  MG 2.9*  --  3.2*  PHOS  --   --  4.7*   Liver Function Tests: Recent Labs    10/01/22 1128 10/02/22 0455  AST 129* 68*  ALT 84* 76*  ALKPHOS 199* 189*  BILITOT 1.7* 1.0  PROT 4.9* 5.6*  ALBUMIN 2.4* 2.5*  2.4*   No results for input(s): "LIPASE", "AMYLASE" in the last 72 hours. CBC: Recent Labs    10/01/22 0416 10/02/22 0455  WBC 10.4 16.1*  NEUTROABS 7.3  --   HGB 12.1* 11.4*  HCT 36.6* 34.7*  MCV 93.4 93.3  PLT 269 305   Cardiac Enzymes: No results for input(s): "CKTOTAL", "CKMB", "CKMBINDEX", "TROPONINI" in the last 72 hours. BNP: Invalid input(s): "POCBNP" D-Dimer: No results for input(s): "DDIMER" in the last 72 hours. Hemoglobin A1C: No results for input(s): "HGBA1C" in the last 72 hours. Fasting Lipid Panel: Recent Labs    10/02/22 0455  TRIG 20   Thyroid Function Tests: No results for input(s): "TSH", "T4TOTAL", "T3FREE", "THYROIDAB" in the last 72 hours.  Invalid input(s): "FREET3" Anemia Panel: No results for input(s): "VITAMINB12", "FOLATE", "FERRITIN", "TIBC", "IRON", "RETICCTPCT" in the last 72 hours.   PHYSICAL  EXAM General: Well developed, well nourished, in no acute distress HEENT:  Normocephalic and atramatic Neck:  No JVD.  Lungs: Clear bilaterally to auscultation and percussion. Heart: HRRR . Normal S1 and S2 without gallops or murmurs.  Abdomen: Bowel sounds are positive, abdomen soft and non-tender  Msk:  Back normal, normal gait. Normal strength and tone for age. Extremities: No clubbing, cyanosis or edema.   Neuro: Alert and oriented X 3. Psych:  Good affect, responds appropriately  TELEMETRY: Sinus rhythm at 85 bpm  ASSESSMENT AND PLAN: Non-STEMI/severe LV dysfunction with ejection fraction below 20% with diffuse hypokinesis.  Status post PEA arrest with downtime more than 10 minutes.  Acute hypoxic respiratory failure requiring mechanical ventilation.  Patient currently is maintaining blood pressure with vasopressin.  Discussed with the family that there are limited options for cardiac point of view and will treat the patient conservatively.  Continue heparin for now.   ICD-10-CM   1. NSTEMI (non-ST elevated myocardial infarction) (HCC)  I21.4       Principal Problem:   NSTEMI (non-ST elevated myocardial infarction) (HCC) Active Problems:  Acquired hypothyroidism   Type 2 diabetes mellitus with stage 3b chronic kidney disease, without long-term current use of insulin (HCC)   Acute kidney injury superimposed on chronic kidney disease (HCC)   GERD without esophagitis   BPH (benign prostatic hyperplasia)   Dyslipidemia    Adrian Blackwater, MD, [facility] 10/02/2022 12:41 PM

## 2022-10-02 NOTE — Progress Notes (Signed)
ANTICOAGULATION CONSULT NOTE  Pharmacy Consult for Heparin Infusion Indication: NSTEMI  Allergies  Allergen Reactions   Mirtazapine Other (See Comments)    Other reaction(s): Dizziness-per wife, Patient has been tolerating 7.5 mg without dizziness.    Patient Measurements: Height: 6\' 2"  (188 cm) Weight: 72.1 kg (158 lb 15.2 oz) IBW/kg (Calculated) : 82.2 Heparin Dosing Weight: 66.2 kg  Vital Signs: Temp: 99.7 F (37.6 C) (09/22 1400) Temp Source: Rectal (09/22 1400) BP: 110/73 (09/22 1400) Pulse Rate: 88 (09/22 1400)  Labs: Recent Labs    09/29/2022 1952 09/15/2022 2225 10/01/22 0416 10/01/22 0418 10/01/22 0604 10/01/22 0830 10/01/22 1128 10/01/22 1251 10/01/22 1614 10/01/22 1706 10/01/22 1834 10/02/22 0455 10/02/22 1352  HGB 12.3*  --  12.1*  --   --   --   --   --   --   --   --  11.4*  --   HCT 36.9*  --  36.6*  --   --   --   --   --   --   --   --  34.7*  --   PLT 266  --  269  --   --   --   --   --   --   --   --  305  --   APTT  --    < >  --   --    < >  --   --   --    < > >200*  --  68* 72*  LABPROT 23.2*  --   --   --   --   --   --   --   --   --   --   --   --   INR 2.0*  --   --   --   --   --   --   --   --   --   --   --   --   HEPARINUNFRC >1.10*  --   --   --   --   --   --   --   --   --   --  >1.10*  --   CREATININE 2.42*  --   --  2.61*  --   --  2.45* 2.50*  --   --  2.57* 2.71*  2.63*  --   TROPONINIHS 2,936*   < >  --  3,098*  --  3,155* 3,011*  --   --   --   --   --   --    < > = values in this interval not displayed.    Estimated Creatinine Clearance: 22.8 mL/min (A) (by C-G formula based on SCr of 2.63 mg/dL (H)).   Medical History: Past Medical History:  Diagnosis Date   Acute blood loss anemia 03/07/2022   Acute respiratory failure due to COVID-19 [facility]) 09/20/2019   Anemia    pernicious anemia   Arrhythmia    patient unaware of diagnosis except heart skips beats   Cancer (HCC)    lip cancer   Complication of anesthesia     unable to void after surgery and had to stay overnight for several days w/ cath   Diabetes mellitus without complication (HCC)    Headache    History of 2019 novel coronavirus disease (COVID-19) 09/26/2019   Formatting of this note might be different from the original. 9/21   History of CVA (cerebrovascular accident) 10/03/2019  Formatting of this note might be different from the original. Right basal ganglia stroke post Covid, 9/21 Right corona radiata infarct, 7/23 Right lateral thalamus, 8/23   History of kidney stones    Hypertension    Kidney stone    Stroke [facility])    UGIB (upper gastrointestinal bleed) 03/07/2022   Assessment: Kurt Hernandez is a 82 y.o. male presenting with NSTEMI. PMH significant for hypothyroidism, GERD, T2DM, BPH, HLD, HTN. Patient was on [facility] PTA per chart review. Last dose of apixaban 5 mg on October 30, 2022 at unknown time. Pharmacy has been consulted to initiate and manage heparin infusion.   Baseline Labs: HL >1.10, PT 23.2, INR 2.0, Hgb 12.3, Hct 36.9, Plt 266   Goal of Therapy:  Heparin level 0.3-0.7 units/ml aPTT 66-102 seconds Monitor platelets by anticoagulation protocol: Yes    Date Time aPTT/HL Rate/Comment 9/21 0604 57/---  900/subtherapeutic 9/21 1614 177/---  1000/supratherapeutic, STAT repeat 9/21 1706 >200/--- 1000/supratherapeutic 9/22 0455 68 / >1.1 900/therapeutic x 1 9/22 1352 72  Therapeutic x 2   Plan:  Heparin remains therapeutic  Continue heparin infusion at 900 units/hr Recheck aPTT/HL with AM labs Continue to monitor aPTT until HL and aPTT correlate.  Check HL daily for correlation until HL and aPTT correlate. Switch to HL monitoring once HL and aPTT correlate.  Continue to monitor H&H and platelets daily while on heparin infusion    Sharen Hones, PharmD, BCPS Clinical Pharmacist   10/02/2022 2:43 PM

## 2022-10-02 NOTE — Progress Notes (Signed)
Patient's B/P started to decrease requiring increase in levophed, Dr. West Bali notified and instructed to stop lasix gtt at this time.

## 2022-10-02 NOTE — Progress Notes (Signed)
Patient's lactic acid resulted at 3.0, Dr. West Bali notified and ordered for repeat and to discontinue tube feedings.

## 2022-10-02 NOTE — Progress Notes (Signed)
Patient only had a urine output of for this shift despite changes in medication and IV bolus ordered by MD.  MD notified of continued urine output decline and ordered LR bolus 

## 2022-10-02 NOTE — Progress Notes (Addendum)
Patient remained encephalopathic despite being on minimal sedation. Initial concerns for hypoxic injury however initial CTH is negative and neuro exams with intact reflexes. He was noted to be hypoglycemic with consecutive BG readings of 22 and 30 which was corrected with Dextrose. Due to worsening renal function which could be contributing to metabolic encephalopathy and net intake of +4L, discussed with Nephrology who agreed with initiating Lasix gtt.  Assessment & Plan #Cardiogenic shock #Acute Decompensated Heart failure, EF <20% #Small Pericardial Effusion #Large Pleural Effusion -BP stable on Levo.   Lactic acid is normal. -CVP variable 11-12 -Net input +4L.Start gentle diuresis -Holding other GDMT. -Continue Heparin gtt. Given impaired renal function, overall comorbities and advanced age really not a good candidate for advanced therapies per cards.  Unclear long-term prognosis although mental status is improving  #Toxic Metabolic Encephalopathy~IMPROVING Likely multifactorial in the setting of possible Uremia,cefepime-induced neurotoxicity. Low suspicion for anoxic injury at this time given patient;s neuro status is improving -Discontinue Cefepime due to concern/risk for neurotoxicity in the setting of impaired renal function -Avoid sedative as able -Correct metabolic causes, hypoglycemia and electrolytes -Hold further imaging as he is beginning to respond and moving all extremities. -D/c Epinephrine gtt, start stress dose steroid   #AKI likely ATN in the setting of above #Hypokalemia #metabolic acidosis with Lactic Acidosis~RESOLVED -Trend Lactate -Monitor I&O's / urinary output -Follow BMP -Ensure adequate renal perfusion -Avoid nephrotoxic agents as able -Replace electrolytes as indicated keep k>4, mag>2 -Nephrology consult, discussed with on call Nephrologist Dr. Thedore Mins who agreed with initiating Lasix gtt they will eval in the am, may need dialysis    #T2DM Now with  episodes of hypoglycemia -CBG's q1; Target range of 140 to 180 -Discontinue long acting insulin -Start D10  -Follow ICU Hypo/Hyperglycemia protocol     Webb Silversmith, DNP, CCRN, FNP-C, AGACNP-BC Acute Care & Family Nurse Practitioner  [facility]  See Amion for personal pager PCCM on call pager [phone] until 7 am

## 2022-10-02 NOTE — Consult Note (Signed)
[facility] Consult Note: 10/02/2022    Date of Admission:  23-Oct-2022           Reason for Consult:  AKI   Referring Provider: Janann Colonel, MD Primary Care Provider: Danella Penton, MD   History of Presenting Illness:  Kurt Hernandez is a 82 y.o. male with  medical history of type 2 diabetes mellitus CVAs in the past presenting with NSTEMI course complicated by cardiogenic shock hypoxic respiratory failure requiring intubation mechanical ventilation on 10-23-22 and PEA arrest with downtime 10 minutes.  Patient is currently intubated and sedated. All information is obtained from the chart and his wife who is at bedside.   Baseline Cr od 1.97 from June 2024. Admit creatinine 2.42 which has increased to 2.71 Patient is non oliguric and has a foley catheter    Review of Systems: ROS - unavailable due to patient condition  Past Medical History:  Diagnosis Date   Acute blood loss anemia 03/07/2022   Acute respiratory failure due to COVID-19 [facility]) 09/20/2019   Anemia    pernicious anemia   Arrhythmia    patient unaware of diagnosis except heart skips beats   Cancer (HCC)    lip cancer   Complication of anesthesia    unable to void after surgery and had to stay overnight for several days w/ cath   Diabetes mellitus without complication (HCC)    Headache    History of 2019 novel coronavirus disease (COVID-19) 09/26/2019   Formatting of this note might be different from the original. 9/21   History of CVA (cerebrovascular accident) 10/03/2019   Formatting of this note might be different from the original. Right basal ganglia stroke post Covid, 9/21 Right corona radiata infarct, 7/23 Right lateral thalamus, 8/23   History of kidney stones    Hypertension    Kidney stone    Stroke [facility])    UGIB (upper gastrointestinal bleed) 03/07/2022    Social History   Tobacco Use   Smoking status: Never   Smokeless tobacco: Never  Vaping Use   Vaping  status: Never Used  Substance Use Topics   Alcohol use: No    Comment: rare   Drug use: No    Family History  Problem Relation Age of Onset   Heart attack Father    Stroke Mother      OBJECTIVE: Blood pressure 115/74, pulse (!) 41, temperature 98.8 F (37.1 C), temperature source Rectal, resp. rate 19, height 6\' 2"  (1.88 m), weight 72.1 kg, SpO2 99%.  Physical Exam Gen: Critically ill appearing HEENT: ETT in place Pulmonary: Vent assisted;  Abdomen: soft, non tender Extr: no edema Neuro: sedated  Lab Results Lab Results  Component Value Date   WBC 16.1 (H) 10/02/2022   HGB 11.4 (L) 10/02/2022   HCT 34.7 (L) 10/02/2022   MCV 93.3 10/02/2022   PLT 305 10/02/2022    Lab Results  Component Value Date   CREATININE 2.63 (H) 10/02/2022   CREATININE 2.71 (H) 10/02/2022   BUN 55 (H) 10/02/2022   BUN 54 (H) 10/02/2022   NA 135 10/02/2022   NA 133 (L) 10/02/2022   K 4.9 10/02/2022   K 4.9 10/02/2022   CL 110 10/02/2022   CL 108 10/02/2022   CO2 17 (L) 10/02/2022   CO2 16 (L) 10/02/2022    Lab Results  Component Value Date   ALT 76 (H) 10/02/2022   AST 68 (H) 10/02/2022   ALKPHOS 189 (H) 10/02/2022  BILITOT 1.0 10/02/2022     Microbiology: Recent Results (from the past 240 hour(s))  SARS Coronavirus 2 by RT PCR (hospital order, performed in [facility] hospital lab) *cepheid single result test* Anterior Nasal Swab     Status: None   Collection Time: 10/01/22  3:45 AM   Specimen: Anterior Nasal Swab  Result Value Ref Range Status   SARS Coronavirus 2 by RT PCR NEGATIVE NEGATIVE Final    Comment: (NOTE) SARS-CoV-2 target nucleic acids are NOT DETECTED.  The SARS-CoV-2 RNA is generally detectable in upper and lower respiratory specimens during the acute phase of infection. The lowest concentration of SARS-CoV-2 viral copies this assay can detect is 250 copies / mL. A negative result does not preclude SARS-CoV-2 infection and should not be used as the sole  basis for treatment or other patient management decisions.  A negative result may occur with improper specimen collection / handling, submission of specimen other than nasopharyngeal swab, presence of viral mutation(s) within the areas targeted by this assay, and inadequate number of viral copies (<250 copies / mL). A negative result must be combined with clinical observations, patient history, and epidemiological information.  Fact Sheet for Patients:   RoadLapTop.co.za  Fact Sheet for Healthcare Providers: http://kim-miller.com/  This test is not yet approved or  cleared by the [place] FDA and has been authorized for detection and/or diagnosis of SARS-CoV-2 by FDA under an Emergency Use Authorization (EUA).  This EUA will remain in effect (meaning this test can be used) for the duration of the COVID-19 declaration under Section 564(b)(1) of the Act, 21 U.S.C. section 360bbb-3(b)(1), unless the authorization is terminated or revoked sooner.  Performed at [facility], [address] [place], [place] [address]   Respiratory (~20 pathogens) panel by PCR     Status: None   Collection Time: 10/01/22  3:45 AM   Specimen: Nasopharyngeal Swab; Respiratory  Result Value Ref Range Status   Adenovirus NOT DETECTED NOT DETECTED Final   Coronavirus 229E NOT DETECTED NOT DETECTED Final    Comment: (NOTE) The Coronavirus on the Respiratory Panel, DOES NOT test for the novel  Coronavirus (2019 nCoV)    Coronavirus HKU1 NOT DETECTED NOT DETECTED Final   Coronavirus NL63 NOT DETECTED NOT DETECTED Final   Coronavirus OC43 NOT DETECTED NOT DETECTED Final   Metapneumovirus NOT DETECTED NOT DETECTED Final   Rhinovirus / Enterovirus NOT DETECTED NOT DETECTED Final   Influenza A NOT DETECTED NOT DETECTED Final   Influenza B NOT DETECTED NOT DETECTED Final   Parainfluenza Virus 1 NOT DETECTED NOT DETECTED Final   Parainfluenza Virus 2  NOT DETECTED NOT DETECTED Final   Parainfluenza Virus 3 NOT DETECTED NOT DETECTED Final   Parainfluenza Virus 4 NOT DETECTED NOT DETECTED Final   Respiratory Syncytial Virus NOT DETECTED NOT DETECTED Final   Bordetella pertussis NOT DETECTED NOT DETECTED Final   Bordetella Parapertussis NOT DETECTED NOT DETECTED Final   Chlamydophila pneumoniae NOT DETECTED NOT DETECTED Final   Mycoplasma pneumoniae NOT DETECTED NOT DETECTED Final    Comment: Performed at [facility], [address], [place], [place] [address]  Culture, blood (Routine X 2) w Reflex to ID Panel     Status: None (Preliminary result)   Collection Time: 10/01/22  7:00 AM   Specimen: BLOOD  Result Value Ref Range Status   Specimen Description BLOOD BLOOD LEFT HAND  Final   Special Requests   Final    BOTTLES DRAWN AEROBIC AND  ANAEROBIC Blood Culture adequate volume   Culture   Final    NO GROWTH 1 DAY Performed at [facility], [address] [place], [place] [address]    Report Status PENDING  Incomplete  Culture, blood (Routine X 2) w Reflex to ID Panel     Status: None (Preliminary result)   Collection Time: 10/01/22  7:00 AM   Specimen: BLOOD  Result Value Ref Range Status   Specimen Description BLOOD BLOOD LEFT ARM  Final   Special Requests   Final    BOTTLES DRAWN AEROBIC AND ANAEROBIC Blood Culture adequate volume   Culture   Final    NO GROWTH 1 DAY Performed at [facility], [address], [place], [place] [address]    Report Status PENDING  Incomplete  MRSA Next Gen by PCR, Nasal     Status: None   Collection Time: 10/01/22  8:30 AM   Specimen: Nasal Mucosa; Nasal Swab  Result Value Ref Range Status   MRSA by PCR Next Gen NOT DETECTED NOT DETECTED Final    Comment: (NOTE) The GeneXpert MRSA Assay (FDA approved for NASAL specimens only), is one component of a comprehensive MRSA colonization surveillance program. It is not intended to diagnose MRSA infection nor to guide or  monitor treatment for MRSA infections. Test performance is not FDA approved in patients less than 54 years old. Performed at [facility], [address] [place], [place] [address]   Culture, Respiratory w Gram Stain     Status: None (Preliminary result)   Collection Time: 10/01/22  9:48 AM   Specimen: Tracheal Aspirate; Respiratory  Result Value Ref Range Status   Specimen Description   Final    TRACHEAL ASPIRATE Performed at [facility], [address] [place], [place] [address]    Special Requests   Final    NONE Performed at [facility], [address] [place], [place] [address]    Gram Stain   Final    FEW WBC SEEN FEW SQUAMOUS EPITHELIAL CELLS PRESENT FEW GRAM POSITIVE COCCI    Culture   Final    CULTURE REINCUBATED FOR BETTER GROWTH Performed at [facility], [address] [place], [place] [address]    Report Status PENDING  Incomplete    Medications: Scheduled Meds:  aspirin EC  81 mg Oral Daily   atorvastatin  80 mg Oral QHS   Chlorhexidine Gluconate Cloth  6 each Topical Daily   guaiFENesin  30 mL Oral Q12H   insulin aspart  0-15 Units Subcutaneous Q4H   [START ON 10/09/2022] levothyroxine  75 mcg Intravenous Daily   melatonin  5 mg Oral Once   pantoprazole (PROTONIX) IV  40 mg Intravenous Daily   Continuous Infusions:  sodium chloride Stopped (10/01/22 0651)   ampicillin-sulbactam (UNASYN) IV Stopped (10/02/22 0542)   dextrose 30 mL/hr at 10/02/22 1100   fentaNYL infusion INTRAVENOUS 25 mcg/hr (10/02/22 1100)   furosemide (LASIX) 200 mg in dextrose 5 % 100 mL (2 mg/mL) infusion 5 mg/hr (10/02/22 1100)   heparin 900 Units/hr (10/02/22 1100)   midazolam 0.5 mg/hr (10/02/22 1100)   norepinephrine (LEVOPHED) Adult infusion 32 mcg/min (10/02/22 1100)   promethazine (PHENERGAN) injection (IM or IVPB)     vasopressin 0.04 Units/min (10/02/22 1100)   PRN Meds:.acetaminophen, fentaNYL (SUBLIMAZE) injection, fentaNYL  (SUBLIMAZE) injection, magnesium hydroxide, midazolam, ondansetron (ZOFRAN) IV, mouth rinse  Allergies  Allergen Reactions   Mirtazapine Other (See Comments)    Other  reaction(s): Dizziness-per wife, Patient has been tolerating 7.5 mg without dizziness.    Urinalysis: Recent Labs    09/17/2022 0630  COLORURINE YELLOW*  LABSPEC 1.019  PHURINE 5.0  GLUCOSEU 50*  HGBUR NEGATIVE  BILIRUBINUR NEGATIVE  KETONESUR NEGATIVE  PROTEINUR >=300*  NITRITE NEGATIVE  LEUKOCYTESUR NEGATIVE      Imaging: DG Chest Port 1 View  Result Date: 10/02/2022 CLINICAL DATA:  Acute respiratory failure with hypoxia. EXAM: PORTABLE CHEST 1 VIEW COMPARISON:  10/01/2022 FINDINGS: Right IJ central venous catheter unchanged with tip over the SVC. Endotracheal tube unchanged in adequate position. Enteric tube courses into the region of the stomach and off the film as tip is not visualized. Lungs are adequately inflated with mild hazy density over the medial lung bases/retrocardiac regions possibly due to small effusion/basilar atelectasis. Remainder of the lungs are clear. No pneumothorax. Cardiomediastinal silhouette and remainder of the exam is unchanged. IMPRESSION: 1. Mild hazy density over the medial lung bases/retrocardiac regions possibly due to small effusions/basilar atelectasis. 2. Support apparatus as described. Electronically Signed   By: Elberta Fortis M.D.   On: 10/02/2022 07:57   ECHOCARDIOGRAM COMPLETE  Result Date: 10/01/2022    ECHOCARDIOGRAM REPORT   Patient Name:   Kurt Hernandez Date of Exam: 10/01/2022 Medical Rec #:  [id number]         Height:       74.0 in Accession #:    [id number]        Weight:       139.6 lb Date of Birth:  06-08-1940        BSA:          1.865 m Patient Age:    81 years          BP:           110/80 mmHg Patient Gender: M                 HR:           82 bpm. Exam Location:  [facility] Procedure: 2D Echo and Intracardiac Opacification Agent Indications:     Cardiac Arrest I46.9   History:         Patient has prior history of Echocardiogram examinations, most                  recent 06/14/2022.  Sonographer:     Overton Mam RDCS, FASE Referring Phys:  [id number] Judithe Modest Diagnosing Phys: Adrian Blackwater  Sonographer Comments: Technically challenging study due to limited acoustic windows and echo performed with patient supine and on artificial respirator. Image acquisition challenging due to respiratory motion. Very challenging study, the patient has very  low acoustic windows. All images acquired from the subcostal view. Definity IV ultrasound imaging agent was attempted on this study. IMPRESSIONS  1. Left ventricular ejection fraction, by estimation, is <20%. The left ventricle has severely decreased function. The left ventricle demonstrates global hypokinesis. The left ventricular internal cavity size was severely dilated. There is mild left ventricular hypertrophy. Left ventricular diastolic parameters are consistent with Grade II diastolic dysfunction (pseudonormalization).  2. Right ventricular systolic function is severely reduced. The right ventricular size is severely enlarged. Mildly increased right ventricular wall thickness.  3. Left atrial size was severely dilated.  4. Right atrial size was severely dilated.  5. A small pericardial effusion is present. The pericardial effusion is circumferential. Large pleural effusion in the left lateral region.  6. The mitral valve is grossly normal. Mild  mitral valve regurgitation.  7. Tricuspid valve regurgitation is moderate.  8. The aortic valve is grossly normal. Aortic valve regurgitation is trivial. Aortic valve sclerosis is present, with no evidence of aortic valve stenosis. Conclusion(s)/Recommendation(s): Findings consistent with dilated cardiomyopathy. FINDINGS  Left Ventricle: Left ventricular ejection fraction, by estimation, is <20%. The left ventricle has severely decreased function. The left ventricle demonstrates global  hypokinesis. Definity contrast agent was given IV to delineate the left ventricular endocardial borders. The left ventricular internal cavity size was severely dilated. There is mild left ventricular hypertrophy. Left ventricular diastolic parameters are consistent with Grade II diastolic dysfunction (pseudonormalization). Right Ventricle: The right ventricular size is severely enlarged. Mildly increased right ventricular wall thickness. Right ventricular systolic function is severely reduced. Left Atrium: Left atrial size was severely dilated. Right Atrium: Right atrial size was severely dilated. Pericardium: A small pericardial effusion is present. The pericardial effusion is circumferential. Mitral Valve: The mitral valve is grossly normal. Mild mitral valve regurgitation. Tricuspid Valve: The tricuspid valve is grossly normal. Tricuspid valve regurgitation is moderate. Aortic Valve: The aortic valve is grossly normal. Aortic valve regurgitation is trivial. Aortic valve sclerosis is present, with no evidence of aortic valve stenosis. Pulmonic Valve: The pulmonic valve was grossly normal. Pulmonic valve regurgitation is trivial. Aorta: The aortic root, ascending aorta and aortic arch are all structurally normal, with no evidence of dilitation or obstruction. IAS/Shunts: No atrial level shunt detected by color flow Doppler. Additional Comments: There is a large pleural effusion in the left lateral region.  LEFT VENTRICLE PLAX 2D LVIDd:         5.50 cm LVIDs:         5.10 cm LV PW:         1.00 cm LV IVS:        0.80 cm LVOT diam:     2.00 cm LV SV:         30 LV SV Index:   16 LVOT Area:     3.14 cm  LV Volumes (MOD) LV vol d, MOD A4C: 118.0 ml LV vol s, MOD A4C: 90.0 ml LV SV MOD A4C:     118.0 ml RIGHT VENTRICLE RV Basal diam:  4.90 cm LEFT ATRIUM           Index        RIGHT ATRIUM           Index LA diam:      4.20 cm 2.25 cm/m   RA Area:     24.90 cm LA Vol (A4C): 91.0 ml 48.79 ml/m  RA Volume:   81.70 ml   43.80 ml/m  AORTIC VALVE             PULMONIC VALVE LVOT Vmax:   62.30 cm/s  PV Vmax:       1.22 m/s LVOT Vmean:  41.900 cm/s PV Peak grad:  6.0 mmHg LVOT VTI:    0.095 m  AORTA Ao Root diam: 3.70 cm MITRAL VALVE               TRICUSPID VALVE MV Area (PHT): 5.27 cm    TR Peak grad:   25.4 mmHg MV Decel Time: 144 msec    TR Vmax:        252.00 cm/s MV E velocity: 38.80 cm/s MV A velocity: 36.70 cm/s  SHUNTS MV E/A ratio:  1.06        Systemic VTI:  0.10 m  Systemic Diam: 2.00 cm Adrian Blackwater Electronically signed by Adrian Blackwater Signature Date/Time: 10/01/2022/5:37:36 PM    Final    CT CHEST ABDOMEN PELVIS WO CONTRAST  Result Date: 10/01/2022 CLINICAL DATA:  Sepsis. Status post cardiac arrest. Acute respiratory failure on ventilator. EXAM: CT CHEST, ABDOMEN AND PELVIS WITHOUT CONTRAST TECHNIQUE: Multidetector CT imaging of the chest, abdomen and pelvis was performed following the standard protocol without IV contrast. RADIATION DOSE REDUCTION: This exam was performed according to the departmental dose-optimization program which includes automated exposure control, adjustment of the mA and/or kV according to patient size and/or use of iterative reconstruction technique. 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 Mediastinum/Lymph Nodes: Endotracheal tube and nasogastric tube seen in expected position. No masses or pathologically enlarged lymph nodes identified on this unenhanced exam. Lungs/Pleura: Moderate bilateral pleural effusions, without significant change. Compressive atelectasis in both lower lobes. Resolution previously seen ground-glass opacity in both lungs, consistent with resolving edema or pneumonitis. Musculoskeletal:  No suspicious bone lesions identified. CT ABDOMEN AND PELVIS FINDINGS Hepatobiliary: Stable small fluid  attenuation cyst in the anterior liver dome. No masses visualized on this unenhanced exam. Periportal edema again seen. High attenuation gallbladder sludge noted, without signs of cholecystitis or biliary ductal dilatation. Pancreas: No mass or inflammatory changes identified on this unenhanced exam. Spleen:  Within normal limits in size. Adrenals/Urinary Tract: Bilateral renal parenchymal atrophy again noted. A few tiny right renal calculi are seen. No evidence of ureteral calculi or hydronephrosis. Urinary bladder is empty, with Foley catheter in place. Stomach/Bowel: Nasogastric tube tip in body of stomach. No evidence of obstruction, inflammatory process, or abnormal fluid collections. Diverticulosis is seen mainly involving the sigmoid colon, however there is no evidence of diverticulitis. Vascular/Lymphatic: No pathologically enlarged lymph nodes identified. No abdominal aortic aneurysm. Reproductive:  Moderately enlarged prostate gland Other: Mild-to-moderate diffuse mesenteric and body wall edema. Mild ascites. Previous bilateral inguinal hernia repairs. No No evidence of recurrent hernia Musculoskeletal:  No suspicious bone lesions identified. IMPRESSION: Moderate bilateral pleural effusions and bilateral lower lobe compressive atelectasis, without significant change. Mild ascites and diffuse mesenteric and body wall edema also seen, consistent with anasarca. Resolution of previously seen ground-glass opacity in both lungs, consistent with resolving edema or pneumonitis. 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 Recommend continued follow-up by CT in 1 year. Tiny right renal calculi. No evidence of ureteral calculi or hydronephrosis. Colonic diverticulosis, without radiographic evidence of diverticulitis. Moderately enlarged prostate. Electronically Signed   By: Danae Orleans M.D.   On: 10/01/2022 09:54   CT HEAD WO CONTRAST ( )  Result Date: 10/01/2022 CLINICAL  DATA:  Mental status change.  Sepsis and cardiac arrest. EXAM: CT HEAD WITHOUT CONTRAST TECHNIQUE: Contiguous axial images were obtained from the base of the skull through the vertex without intravenous contrast. RADIATION DOSE REDUCTION: This exam was performed according to the departmental dose-optimization program which includes automated exposure control, adjustment of the mA and/or kV according to patient size and/or use of iterative reconstruction technique. COMPARISON:  October 09, 2022 FINDINGS: Brain: No evidence of acute infarction, hemorrhage, hydrocephalus, extra-axial collection or mass lesion/mass effect. Chronic small vessel ischemia is extensive in the cerebral white matter with both patchy white matter low-density and multiple chronic lacunar infarcts at the deep gray nuclei and deep white matter tracks, worse on the right. Generalized brain atrophy Vascular: No hyperdense  vessel or unexpected calcification. Skull: Normal. Negative for fracture or focal lesion. Sinuses/Orbits: No acute finding. IMPRESSION: Atrophy and extensive chronic small vessel disease. Electronically Signed   By: Tiburcio Pea M.D.   On: 10/01/2022 09:41   DG Abd 1 View  Result Date: 10/01/2022 CLINICAL DATA:  Encounter for central line EXAM: ABDOMEN - 1 VIEW COMPARISON:  Earlier today FINDINGS: Enteric tube with tip and side port at the stomach. The bowel gas pattern is normal. No concerning mass effect or gas collection where covered. IMPRESSION: Located enteric tube Electronically Signed   By: Tiburcio Pea M.D.   On: 10/01/2022 06:53   DG Chest Port 1 View  Result Date: 10/01/2022 CLINICAL DATA:  Encounter for central line EXAM: PORTABLE CHEST 1 VIEW COMPARISON:  Earlier today FINDINGS: Right IJ line with tip at the SVC. Endotracheal tube with tip just below the clavicular heads. An enteric tube reaches the diaphragm. Hazy density at the bases from pleural fluid, reaching the apices. No superimposed Kerley lines or  air bronchogram. No pneumothorax. Normal heart size. IMPRESSION: 1. Unremarkable hardware.  No pneumothorax. 2. Bilateral layering pleural effusion. Electronically Signed   By: Tiburcio Pea M.D.   On: 10/01/2022 06:52   DG Abd 1 View  Result Date: 10/01/2022 CLINICAL DATA:  82 year old male with history of nausea and vomiting. Respiratory distress. EXAM: ABDOMEN - 1 VIEW COMPARISON:  Chest x-ray 05/21/2018. FINDINGS: Gas and stool are seen scattered throughout the colon extending to the level of the distal rectum. No pathologic distension of small bowel is noted. No gross evidence of pneumoperitoneum. Multiple soft tissue anchors project over the pelvis bilaterally, apparently from prior bilateral inguinal herniorrhaphy. IMPRESSION: 1. Nonobstructive bowel gas pattern. 2. No pneumoperitoneum. Electronically Signed   By: Trudie Reed M.D.   On: 10/01/2022 05:21   DG Chest Port 1 View  Result Date: 10/01/2022 CLINICAL DATA:  82 year old male with history of respiratory distress. Nausea and vomiting. EXAM: PORTABLE CHEST 1 VIEW COMPARISON:  Chest x-ray 09/13/2022. FINDINGS: Lung volumes are low. Bibasilar opacities may reflect areas of atelectasis and/or consolidation. Small bilateral pleural effusions (right greater than left). Extensive skin fold artifact projecting over the right hemithorax incidentally noted. No definite pneumothorax confidently identified. No evidence of pulmonary edema. Heart size is normal. The patient is rotated to the left on today's exam, resulting in distortion of the mediastinal contours and reduced diagnostic sensitivity and specificity for mediastinal pathology. Atherosclerotic calcifications are noted in the thoracic aorta. Transcutaneous defibrillator pads project over the left hemithorax. IMPRESSION: 1. Increasing bibasilar areas of atelectasis and/or consolidation with slight increased size of small bilateral pleural effusions (right greater than left). 2. Aortic  atherosclerosis. Electronically Signed   By: Trudie Reed M.D.   On: 10/01/2022 05:20   CT Head Wo Contrast  Result Date: 10/01/2022 CLINICAL DATA:  Head trauma and chest pain. EXAM: CT HEAD WITHOUT CONTRAST TECHNIQUE: Contiguous axial images were obtained from the base of the skull through the vertex without intravenous contrast. RADIATION DOSE REDUCTION: This exam was performed according to the departmental dose-optimization program which includes automated exposure control, adjustment of the mA and/or kV according to patient size and/or use of iterative reconstruction technique. COMPARISON:  Head CT 07/01/2022 FINDINGS: Brain: No evidence of acute infarction, hemorrhage, hydrocephalus, extra-axial collection or mass lesion/mass effect. Again seen is mild diffuse atrophy. Small old infarcts are seen in the right basal ganglia. Vascular: There is scattered air seen within the deep soft tissues of the neck and  venous structures of the cavernous sinuses. Skull: Normal. Negative for fracture or focal lesion. Sinuses/Orbits: No acute finding. Other: None. IMPRESSION: 1. No acute intracranial abnormality. 2. 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 3. Scattered air within the deep soft tissues of the neck and venous structures of the cavernous sinuses. Please correlate clinically. Electronically Signed   By: Darliss Cheney M.D.   On: 09/22/2022 22:59   CT CHEST WO CONTRAST  Result Date: 10/07/2022 CLINICAL DATA:  Blunt chest trauma, fall EXAM: CT CHEST WITHOUT CONTRAST TECHNIQUE: Multidetector CT imaging of the chest was performed following the standard protocol without IV contrast. RADIATION DOSE REDUCTION: This exam was performed according to the departmental dose-optimization program which includes automated exposure control, adjustment of the mA and/or kV according to patient size and/or use of iterative reconstruction technique. COMPARISON:  09/14/2021 FINDINGS: Cardiovascular: Cardiac size at the upper  limits of normal. Moderate multi-vessel coronary artery calcification. No pericardial effusion. The central pulmonary arteries are enlarged in keeping with changes of pulmonary arterial hypertension. Moderate atherosclerotic calcification is seen within the thoracic aorta. There is mild dilation of the thoracic aorta measuring 4.2 cm in diameter in its ascending segment, and 3.2 cm in diameter in its proximal and distal descending segment. Mediastinum/Nodes: 15 mm hypodense nodule within the right thyroid gland is not well characterized on this examination, but appears stable since prior examination. There is punctate subcutaneous gas noted within the visualized neck soft tissues bilaterally and subclavicular regions bilaterally which may relate to trauma given the provided history. Punctate gas within the mediastinum, however, appears intravascular within the brachiocephalic vein and superior vena cava and likely relates to intravenous injection. No pneumomediastinum. Shotty mediastinal adenopathy has developed with index lymph nodes measuring up to 13 mm in short axis diameter which is nonspecific and may be reactive or inflammatory in nature. Low-grade lymphoproliferative processes could appear similarly. This appears new since prior examination. The esophagus is unremarkable. Lungs/Pleura: Moderate bilateral pleural effusions are present with compressive atelectasis of the dependent lungs bilaterally. Mild thickening of the peribronchovascular interstitium may relate to airway inflammation or trace interstitial pulmonary edema. No pneumothorax. No focal consolidation. No central obstructing lesion. Upper Abdomen: Mild ascites noted within the visualized upper abdomen. Punctate nonobstructing calculi noted within the visualized upper pole the right kidney measuring up to 2 mm. No acute abnormality identified. Musculoskeletal: Osseous structures are age-appropriate. No acute bone abnormality. IMPRESSION: 1.  Moderate bilateral pleural effusions with compressive atelectasis of the dependent lungs bilaterally. Mild ascites within the visualized upper abdomen. Mild thickening of the peribronchovascular interstitium may reflect trace pulmonary edema and together these findings may relate to changes of cardiogenic failure. 2. Moderate multi-vessel coronary artery calcification. 3. Mild dilation of the thoracic aorta measuring up to 4.2 cm in diameter in its ascending segment. Recommend follow-up ultrasound every 12 months and vascular consultation. 4. Shotty mediastinal adenopathy, nonspecific and may be reactive or inflammatory in nature. Low-grade lymphoproliferative processes could appear similarly. In absence of interval prior examination, PET CT examination or follow-up CT imaging may be helpful in 3 months to document stability or resolution. 5. Nonobstructing right renal calculi. 6. 15 mm hypodense nodule within the right thyroid gland, not well characterized on this examination, but appears stable since prior examination. If indicated, depending on the patient's comorbidities, this could be better assessed with dedicated thyroid sonography. Aortic Atherosclerosis (ICD10-I70.0). Electronically Signed   By: Helyn Numbers M.D.   On: 09/27/2022 21:55   DG Chest 1 View  Result  Date: 09/27/2022 CLINICAL DATA:  Chest pain EXAM: CHEST  1 VIEW COMPARISON:  06/20/2022 FINDINGS: The lungs are symmetrically well inflated. Small bilateral pleural effusions are present, right greater than left. Mild slightly asymmetric perihilar interstitial pulmonary infiltrate is present, new since prior examination, possibly reflecting mild interstitial pulmonary edema or airway inflammation. No pneumothorax. Cardiac size within normal limits. No acute bone abnormality. IMPRESSION: 1. Small bilateral pleural effusions, right greater than left. 2. Mild perihilar interstitial pulmonary infiltrate, possibly reflecting mild interstitial  pulmonary edema or airway inflammation. Electronically Signed   By: Helyn Numbers M.D.   On: 09/15/2022 21:43      Assessment/Plan:  MANJINDER CARAWAY is a 82 y.o. 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 Patient is currently nonoliguric, serum creatinine is slowly increasing. Electrolytes and volume status are acceptable.  No acute indication for dialysis at present. Currently requiring IV furosemide infusion for volume optimization.  Chronic kidney disease stage IIIb Baseline creatinine of 1.97/GFR 34 from 07/01/2022. CKD risk factors include diabetes, atherosclerosis. Home regimen included atorvastatin for cardiovascular risk reduction. Currently on aspirin, atorvastatin.  BPH, history of urinary incontinence Currently has Foley catheter in place.  Hypotension and acute respiratory failure. Currently requiring vasopressor support and ventilator support as per ICU team. 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   Shadaya Marschner Thedore Mins 10/02/22

## 2022-10-03 LAB — CULTURE, RESPIRATORY W GRAM STAIN

## 2022-10-04 LAB — LIPOPROTEIN A (LPA): Lipoprotein (a): 77.1 nmol/L — ABNORMAL HIGH (ref <75.0)

## 2022-10-06 LAB — CULTURE, BLOOD (ROUTINE X 2)
Culture: NO GROWTH
Culture: NO GROWTH
Culture: NO GROWTH
Culture: NO GROWTH
Special Requests: ADEQUATE
Special Requests: ADEQUATE

## 2022-10-11 NOTE — Progress Notes (Signed)
Pt started on crrt at 2200. Pt became progressively more hypotensive, despite vasopressors at maximum doses. Pt became bradycardic with wide complexes. Unable to doppler pulses. Pt went to asystole at 0035 am. Pronounced at 0035 By Webb Silversmith NP. Family called and came to beside.

## 2022-10-11 NOTE — Progress Notes (Signed)
Terminally extubated patient.

## 2022-10-11 NOTE — Progress Notes (Signed)
I responded to a page from the nurse to provide spiritual support for the patient's family. I arrived at the patient's room where his wife and several family members were present. I provided spiritual support through pastoral presence, sharing words of comfort, and by leading in prayer.    09/26/2022 0109  Spiritual Encounters  Type of Visit Initial  Care provided to: [facility] partners present during encounter Nurse  Referral source Nurse (RN/NT/LPN)  Reason for visit Patient death  OnCall Visit Yes  Interventions  Spiritual Care Interventions Made Established relationship of care and support;Compassionate presence;Prayer    Chaplain Dr Melvyn Novas

## 2022-10-11 NOTE — Death Summary Note (Signed)
DEATH SUMMARY   Patient Details  Name: Kurt Hernandez MRN: [id number] DOB: 1940-03-09  Admission/Discharge Information   Admit Date:  10-10-2022  Date of Death: Date of Death: 2022/10/13  Time of Death: Time of Death: 2033/12/09  Length of Stay: 3  Referring Physician: Danella Penton, MD   Reason(s) for Hospitalization  NSTEMI  Diagnoses  Preliminary cause of death: Cardiogenic shock Secondary Diagnoses (including complications and co-morbidities):  Principal Problem:   NSTEMI (non-ST elevated myocardial infarction) [facility]) Active Problems:   Acquired hypothyroidism   Type 2 diabetes mellitus with stage 3b chronic kidney disease, without long-term current use of insulin (HCC)   Acute kidney injury superimposed on chronic kidney disease (HCC)   GERD without esophagitis   BPH (benign prostatic hyperplasia)   Dyslipidemia   Brief Hospital Course (including significant findings, care, treatment, and services provided and events leading to death)  Kurt Hernandez is a 82 y.o. year old male who presented to the hospital with NSTEMI. Hospital/ICU course complicated by cardiogenic shock, hypoxic respiratory failure requiring intubation mechanical ventilation on October 10, 2022 and PEA arrest with downtime 10 minutes. PEA arrest was thought to be secondary to Acute hypoxic respiratory failure in the setting of Cardiogenic shock with central venous sat 42% bedside ultrasound with severely reduced LV function secondary to  NSTEMI with troponin peaking at 3100, Possible aspiration pneumonia, Oliguric AKI secondary to hypoperfusion with possible ATN vs cardiorenal and Hypoxic metabolic encephalopathy (see progress notes for assessment and plan for each disease process). Code status was discussed with patient's family members given poor prognosis and family decided to change code status from Full to DNR (see IPAL). On 9/22, Patient's clinical condition worsened with severe metabolic acidosis requiring higher  doses of multiple pressors and sodium bicarbonate  for correction. He developed worsening AKI, hyperkalemia ( potassium of 5.6) with worsening lactic acidosis. Case discussed with nephrology who recommended initiating emergent CRRT. On Oct 13, 2022, approximately 2 hours after initiating CRRT, . Pt became progressively hypotensive, despite vasopressors at maximum doses. Pt became bradycardic with wide complexes. Unable to doppler pulses. Pt went to Asyst[facility] at 0035 am. Patient was not resuscitated per family wishes. He passed away shortly and was pronounced at 0035.  Family was notified who came at the bedside.  Pertinent Labs and Studies  Significant Diagnostic Studies DG Chest Port 1 View  Result Date: 10/02/2022 CLINICAL DATA:  10026 Shortness of breath 10026 EXAM: PORTABLE CHEST 1 VIEW COMPARISON:  October 02, 2022 FINDINGS: The cardiomediastinal silhouette is unchanged in contour.ETT tip terminates approximately 2.3 cm above the carina. RIGHT IJ CVC tip terminates over the superior cavoatrial junction. The enteric tube courses through the chest to the abdomen beyond the field-of-view. Small layering bilateral pleural effusions. No pneumothorax. Hazy bibasilar opacities, likely atelectasis. IMPRESSION: 1. Support apparatus as described above. 2. Small layering bilateral pleural effusions with adjacent atelectasis. Electronically Signed   By: Meda Klinefelter M.D.   On: 10/02/2022 17:32   DG Chest Port 1 View  Result Date: 10/02/2022 CLINICAL DATA:  Acute respiratory failure with hypoxia. EXAM: PORTABLE CHEST 1 VIEW COMPARISON:  10/01/2022 FINDINGS: Right IJ central venous catheter unchanged with tip over the SVC. Endotracheal tube unchanged in adequate position. Enteric tube courses into the region of the stomach and off the film as tip is not visualized. Lungs are adequately inflated with mild hazy density over the medial lung bases/retrocardiac regions possibly due to small effusion/basilar atelectasis.  Remainder of the lungs are clear. No pneumothorax. Cardiomediastinal silhouette  and remainder of the exam is unchanged. IMPRESSION: 1. Mild hazy density over the medial lung bases/retrocardiac regions possibly due to small effusions/basilar atelectasis. 2. Support apparatus as described. Electronically Signed   By: Elberta Fortis M.D.   On: 10/02/2022 07:57   ECHOCARDIOGRAM COMPLETE  Result Date: 10/01/2022    ECHOCARDIOGRAM REPORT   Patient Name:   Kurt Hernandez Date of Exam: 10/01/2022 Medical Rec #:  [id number]         Height:       74.0 in Accession #:    [id number]        Weight:       139.6 lb Date of Birth:  [birth year]        BSA:          1.865 m Patient Age:    81 years          BP:           110/80 mmHg Patient Gender: M                 HR:           82 bpm. Exam Location:  [facility] Procedure: 2D Echo and Intracardiac Opacification Agent Indications:     Cardiac Arrest I46.9  History:         Patient has prior history of Echocardiogram examinations, most                  recent 06/14/2022.  Sonographer:     Overton Mam RDCS, FASE Referring Phys:  [id number] Judithe Modest Diagnosing Phys: Adrian Blackwater  Sonographer Comments: Technically challenging study due to limited acoustic windows and echo performed with patient supine and on artificial respirator. Image acquisition challenging due to respiratory motion. Very challenging study, the patient has very  low acoustic windows. All images acquired from the subcostal view. Definity IV ultrasound imaging agent was attempted on this study. IMPRESSIONS  1. Left ventricular ejection fraction, by estimation, is <20%. The left ventricle has severely decreased function. The left ventricle demonstrates global hypokinesis. The left ventricular internal cavity size was severely dilated. There is mild left ventricular hypertrophy. Left ventricular diastolic parameters are consistent with Grade II diastolic dysfunction (pseudonormalization).  2. Right ventricular  systolic function is severely reduced. The right ventricular size is severely enlarged. Mildly increased right ventricular wall thickness.  3. Left atrial size was severely dilated.  4. Right atrial size was severely dilated.  5. A small pericardial effusion is present. The pericardial effusion is circumferential. Large pleural effusion in the left lateral region.  6. The mitral valve is grossly normal. Mild mitral valve regurgitation.  7. Tricuspid valve regurgitation is moderate.  8. The aortic valve is grossly normal. Aortic valve regurgitation is trivial. Aortic valve sclerosis is present, with no evidence of aortic valve stenosis. Conclusion(s)/Recommendation(s): Findings consistent with dilated cardiomyopathy. FINDINGS  Left Ventricle: Left ventricular ejection fraction, by estimation, is <20%. The left ventricle has severely decreased function. The left ventricle demonstrates global hypokinesis. Definity contrast agent was given IV to delineate the left ventricular endocardial borders. The left ventricular internal cavity size was severely dilated. There is mild left ventricular hypertrophy. Left ventricular diastolic parameters are consistent with Grade II diastolic dysfunction (pseudonormalization). Right Ventricle: The right ventricular size is severely enlarged. Mildly increased right ventricular wall thickness. Right ventricular systolic function is severely reduced. Left Atrium: Left atrial size was severely dilated. Right Atrium: Right atrial size was severely dilated. Pericardium: A small pericardial effusion  is present. The pericardial effusion is circumferential. Mitral Valve: The mitral valve is grossly normal. Mild mitral valve regurgitation. Tricuspid Valve: The tricuspid valve is grossly normal. Tricuspid valve regurgitation is moderate. Aortic Valve: The aortic valve is grossly normal. Aortic valve regurgitation is trivial. Aortic valve sclerosis is present, with no evidence of aortic valve  stenosis. Pulmonic Valve: The pulmonic valve was grossly normal. Pulmonic valve regurgitation is trivial. Aorta: The aortic root, ascending aorta and aortic arch are all structurally normal, with no evidence of dilitation or obstruction. IAS/Shunts: No atrial level shunt detected by color flow Doppler. Additional Comments: There is a large pleural effusion in the left lateral region.  LEFT VENTRICLE PLAX 2D LVIDd:         5.50 cm LVIDs:         5.10 cm LV PW:         1.00 cm LV IVS:        0.80 cm LVOT diam:     2.00 cm LV SV:         30 LV SV Index:   16 LVOT Area:     3.14 cm  LV Volumes (MOD) LV vol d, MOD A4C: 118.0 ml LV vol s, MOD A4C: 90.0 ml LV SV MOD A4C:     118.0 ml RIGHT VENTRICLE RV Basal diam:  4.90 cm LEFT ATRIUM           Index        RIGHT ATRIUM           Index LA diam:      4.20 cm 2.25 cm/m   RA Area:     24.90 cm LA Vol (A4C): 91.0 ml 48.79 ml/m  RA Volume:   81.70 ml  43.80 ml/m  AORTIC VALVE             PULMONIC VALVE LVOT Vmax:   62.30 cm/s  PV Vmax:       1.22 m/s LVOT Vmean:  41.900 cm/s PV Peak grad:  6.0 mmHg LVOT VTI:    0.095 m  AORTA Ao Root diam: 3.70 cm MITRAL VALVE               TRICUSPID VALVE MV Area (PHT): 5.27 cm    TR Peak grad:   25.4 mmHg MV Decel Time: 144 msec    TR Vmax:        252.00 cm/s MV E velocity: 38.80 cm/s MV A velocity: 36.70 cm/s  SHUNTS MV E/A ratio:  1.06        Systemic VTI:  0.10 m                            Systemic Diam: 2.00 cm Adrian Blackwater Electronically signed by Adrian Blackwater Signature Date/Time: 10/01/2022/5:37:36 PM    Final    CT CHEST ABDOMEN PELVIS WO CONTRAST  Result Date: 10/01/2022 CLINICAL DATA:  Sepsis. Status post cardiac arrest. Acute respiratory failure on ventilator. EXAM: CT CHEST, ABDOMEN AND PELVIS WITHOUT CONTRAST TECHNIQUE: Multidetector CT imaging of the chest, abdomen and pelvis was performed following the standard protocol without IV contrast. RADIATION DOSE REDUCTION: This exam was performed according to the departmental  dose-optimization program which includes automated exposure control, adjustment of the mA and/or kV according to patient size and/or use of iterative reconstruction technique. 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 Mediastinum/Lymph Nodes: Endotracheal tube and nasogastric tube seen in expected position. No masses or pathologically enlarged lymph nodes identified on this unenhanced exam. Lungs/Pleura: Moderate bilateral pleural effusions, without significant change. Compressive atelectasis in both lower lobes. Resolution previously seen ground-glass opacity in both lungs, consistent with resolving edema or pneumonitis. Musculoskeletal:  No suspicious bone lesions identified. CT ABDOMEN AND PELVIS FINDINGS Hepatobiliary: Stable small fluid attenuation cyst in the anterior liver dome. No masses visualized on this unenhanced exam. Periportal edema again seen. High attenuation gallbladder sludge noted, without signs of cholecystitis or biliary ductal dilatation. Pancreas: No mass or inflammatory changes identified on this unenhanced exam. Spleen:  Within normal limits in size. Adrenals/Urinary Tract: Bilateral renal parenchymal atrophy again noted. A few tiny right renal calculi are seen. No evidence of ureteral calculi or hydronephrosis. Urinary bladder is empty, with Foley catheter in place. Stomach/Bowel: Nasogastric tube tip in body of stomach. No evidence of obstruction, inflammatory process, or abnormal fluid collections. Diverticulosis is seen mainly involving the sigmoid colon, however there is no evidence of diverticulitis. Vascular/Lymphatic: No pathologically enlarged lymph nodes identified. No abdominal aortic aneurysm. Reproductive:  Moderately enlarged prostate gland Other: Mild-to-moderate diffuse mesenteric and body wall edema. Mild ascites.  Previous bilateral inguinal hernia repairs. No No evidence of recurrent hernia Musculoskeletal:  No suspicious bone lesions identified. IMPRESSION: Moderate bilateral pleural effusions and bilateral lower lobe compressive atelectasis, without significant change. Mild ascites and diffuse mesenteric and body wall edema also seen, consistent with anasarca. Resolution of previously seen ground-glass opacity in both lungs, consistent with resolving edema or pneumonitis. 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 Recommend continued follow-up by CT in 1 year. Tiny right renal calculi. No evidence of ureteral calculi or hydronephrosis. Colonic diverticulosis, without radiographic evidence of diverticulitis. Moderately enlarged prostate. Electronically Signed   By: Danae Orleans M.D.   On: 10/01/2022 09:54   CT HEAD WO CONTRAST ( )  Result Date: 10/01/2022 CLINICAL DATA:  Mental status change.  Sepsis and cardiac arrest. EXAM: CT HEAD WITHOUT CONTRAST TECHNIQUE: Contiguous axial images were obtained from the base of the skull through the vertex without intravenous contrast. RADIATION DOSE REDUCTION: This exam was performed according to the departmental dose-optimization program which includes automated exposure control, adjustment of the mA and/or kV according to patient size and/or use of iterative reconstruction technique. COMPARISON:  2022-10-28 FINDINGS: Brain: No evidence of acute infarction, hemorrhage, hydrocephalus, extra-axial collection or mass lesion/mass effect. Chronic small vessel ischemia is extensive in the cerebral white matter with both patchy white matter low-density and multiple chronic lacunar infarcts at the deep gray nuclei and deep white matter tracks, worse on the right. Generalized brain atrophy Vascular: No hyperdense vessel or unexpected calcification. Skull: Normal. Negative for fracture or focal lesion. Sinuses/Orbits: No acute finding. 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 Electronically Signed   By: Tiburcio Pea M.D.   On: 10/01/2022 09:41   DG Abd 1 View  Result Date: 10/01/2022 CLINICAL DATA:  Encounter for central line EXAM: ABDOMEN - 1 VIEW COMPARISON:  Earlier today FINDINGS: Enteric tube with tip and side port at the stomach. The bowel gas pattern is normal. No concerning mass effect or gas collection where covered. IMPRESSION: Located enteric tube Electronically Signed   By: Tiburcio Pea M.D.   On: 10/01/2022 06:53   DG Chest Port 1 View  Result Date: 10/01/2022 CLINICAL DATA:  Encounter for central line EXAM: PORTABLE  CHEST 1 VIEW COMPARISON:  Earlier today FINDINGS: Right IJ line with tip at the SVC. Endotracheal tube with tip just below the clavicular heads. An enteric tube reaches the diaphragm. Hazy density at the bases from pleural fluid, reaching the apices. No superimposed Kerley lines or air bronchogram. No pneumothorax. Normal heart size. IMPRESSION: 1. Unremarkable hardware.  No pneumothorax. 2. Bilateral layering pleural effusion. Electronically Signed   By: Tiburcio Pea M.D.   On: 10/01/2022 06:52   DG Abd 1 View  Result Date: 10/01/2022 CLINICAL DATA:  82 year old male with history of nausea and vomiting. Respiratory distress. EXAM: ABDOMEN - 1 VIEW COMPARISON:  Chest x-ray 05/21/2018. FINDINGS: Gas and stool are seen scattered throughout the colon extending to the level of the distal rectum. No pathologic distension of small bowel is noted. No gross evidence of pneumoperitoneum. Multiple soft tissue anchors project over the pelvis bilaterally, apparently from prior bilateral inguinal herniorrhaphy. IMPRESSION: 1. Nonobstructive bowel gas pattern. 2. No pneumoperitoneum. Electronically Signed   By: Trudie Reed M.D.   On: 10/01/2022 05:21   DG Chest Port 1 View  Result Date: 10/01/2022 CLINICAL DATA:  82 year old male with history of respiratory distress. Nausea and vomiting. EXAM:  PORTABLE CHEST 1 VIEW COMPARISON:  Chest x-ray 09/11/2022. FINDINGS: Lung volumes are low. Bibasilar opacities may reflect areas of atelectasis and/or consolidation. Small bilateral pleural effusions (right greater than left). Extensive skin fold artifact projecting over the right hemithorax incidentally noted. No definite pneumothorax confidently identified. No evidence of pulmonary edema. Heart size is normal. The patient is rotated to the left on today's exam, resulting in distortion of the mediastinal contours and reduced diagnostic sensitivity and specificity for mediastinal pathology. Atherosclerotic calcifications are noted in the thoracic aorta. Transcutaneous defibrillator pads project over the left hemithorax. IMPRESSION: 1. Increasing bibasilar areas of atelectasis and/or consolidation with slight increased size of small bilateral pleural effusions (right greater than left). 2. Aortic atherosclerosis. Electronically Signed   By: Trudie Reed M.D.   On: 10/01/2022 05:20   CT Head Wo Contrast  Result Date: 09/27/2022 CLINICAL DATA:  Head trauma and chest pain. EXAM: CT HEAD WITHOUT CONTRAST TECHNIQUE: Contiguous axial images were obtained from the base of the skull through the vertex without intravenous contrast. RADIATION DOSE REDUCTION: This exam was performed according to the departmental dose-optimization program which includes automated exposure control, adjustment of the mA and/or kV according to patient size and/or use of iterative reconstruction technique. COMPARISON:  Head CT 07/01/2022 FINDINGS: Brain: No evidence of acute infarction, hemorrhage, hydrocephalus, extra-axial collection or mass lesion/mass effect. Again seen is mild diffuse atrophy. Small old infarcts are seen in the right basal ganglia. Vascular: There is scattered air seen within the deep soft tissues of the neck and venous structures of the cavernous sinuses. Skull: Normal. Negative for fracture or focal lesion.  Sinuses/Orbits: No acute finding. Other: None. IMPRESSION: 1. No acute intracranial abnormality. 2. 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 3. Scattered air within the deep soft tissues of the neck and venous structures of the cavernous sinuses. Please correlate clinically. Electronically Signed   By: Darliss Cheney M.D.   On: 10/05/2022 22:59   CT CHEST WO CONTRAST  Result Date: 09/27/2022 CLINICAL DATA:  Blunt chest trauma, fall EXAM: CT CHEST WITHOUT CONTRAST TECHNIQUE: Multidetector CT imaging of the chest was performed following the standard protocol without IV contrast. RADIATION DOSE REDUCTION: This exam was performed according to the departmental dose-optimization program which includes automated exposure control, adjustment of the mA  and/or kV according to patient size and/or use of iterative reconstruction technique. COMPARISON:  09/14/2021 FINDINGS: Cardiovascular: Cardiac size at the upper limits of normal. Moderate multi-vessel coronary artery calcification. No pericardial effusion. The central pulmonary arteries are enlarged in keeping with changes of pulmonary arterial hypertension. Moderate atherosclerotic calcification is seen within the thoracic aorta. There is mild dilation of the thoracic aorta measuring 4.2 cm in diameter in its ascending segment, and 3.2 cm in diameter in its proximal and distal descending segment. Mediastinum/Nodes: 15 mm hypodense nodule within the right thyroid gland is not well characterized on this examination, but appears stable since prior examination. There is punctate subcutaneous gas noted within the visualized neck soft tissues bilaterally and subclavicular regions bilaterally which may relate to trauma given the provided history. Punctate gas within the mediastinum, however, appears intravascular within the brachiocephalic vein and superior vena cava and likely relates to intravenous injection. No pneumomediastinum. Shotty mediastinal adenopathy has developed  with index lymph nodes measuring up to 13 mm in short axis diameter which is nonspecific and may be reactive or inflammatory in nature. Low-grade lymphoproliferative processes could appear similarly. This appears new since prior examination. The esophagus is unremarkable. Lungs/Pleura: Moderate bilateral pleural effusions are present with compressive atelectasis of the dependent lungs bilaterally. Mild thickening of the peribronchovascular interstitium may relate to airway inflammation or trace interstitial pulmonary edema. No pneumothorax. No focal consolidation. No central obstructing lesion. Upper Abdomen: Mild ascites noted within the visualized upper abdomen. Punctate nonobstructing calculi noted within the visualized upper pole the right kidney measuring up to 2 mm. No acute abnormality identified. Musculoskeletal: Osseous structures are age-appropriate. No acute bone abnormality. IMPRESSION: 1. Moderate bilateral pleural effusions with compressive atelectasis of the dependent lungs bilaterally. Mild ascites within the visualized upper abdomen. Mild thickening of the peribronchovascular interstitium may reflect trace pulmonary edema and together these findings may relate to changes of cardiogenic failure. 2. Moderate multi-vessel coronary artery calcification. 3. Mild dilation of the thoracic aorta measuring up to 4.2 cm in diameter in its ascending segment. Recommend follow-up ultrasound every 12 months and vascular consultation. 4. Shotty mediastinal adenopathy, nonspecific and may be reactive or inflammatory in nature. Low-grade lymphoproliferative processes could appear similarly. In absence of interval prior examination, PET CT examination or follow-up CT imaging may be helpful in 3 months to document stability or resolution. 5. Nonobstructing right renal calculi. 6. 15 mm hypodense nodule within the right thyroid gland, not well characterized on this examination, but appears stable since prior  examination. If indicated, depending on the patient's comorbidities, this could be better assessed with dedicated thyroid sonography. Aortic Atherosclerosis (ICD10-I70.0). Electronically Signed   By: Helyn Numbers M.D.   On: 10/07/2022 21:55   DG Chest 1 View  Result Date: 09/16/2022 CLINICAL DATA:  Chest pain EXAM: CHEST  1 VIEW COMPARISON:  06/20/2022 FINDINGS: The lungs are symmetrically well inflated. Small bilateral pleural effusions are present, right greater than left. Mild slightly asymmetric perihilar interstitial pulmonary infiltrate is present, new since prior examination, possibly reflecting mild interstitial pulmonary edema or airway inflammation. No pneumothorax. Cardiac size within normal limits. No acute bone abnormality. IMPRESSION: 1. Small bilateral pleural effusions, right greater than left. 2. Mild perihilar interstitial pulmonary infiltrate, possibly reflecting mild interstitial pulmonary edema or airway inflammation. Electronically Signed   By: Helyn Numbers M.D.   On: 10/05/2022 21:43    Microbiology Recent Results (from the past 240 hour(s))  SARS Coronavirus 2 by RT PCR (hospital order, performed in [facility]  hospital lab) *cepheid single result test* Anterior Nasal Swab     Status: None   Collection Time: 10/01/22  3:45 AM   Specimen: Anterior Nasal Swab  Result Value Ref Range Status   SARS Coronavirus 2 by RT PCR NEGATIVE NEGATIVE Final    Comment: (NOTE) SARS-CoV-2 target nucleic acids are NOT DETECTED.  The SARS-CoV-2 RNA is generally detectable in upper and lower respiratory specimens during the acute phase of infection. The lowest concentration of SARS-CoV-2 viral copies this assay can detect is 250 copies / mL. A negative result does not preclude SARS-CoV-2 infection and should not be used as the sole basis for treatment or other patient management decisions.  A negative result may occur with improper specimen collection / handling, submission of specimen  other than nasopharyngeal swab, presence of viral mutation(s) within the areas targeted by this assay, and inadequate number of viral copies (<250 copies / mL). A negative result must be combined with clinical observations, patient history, and epidemiological information.  Fact Sheet for Patients:   RoadLapTop.co.za  Fact Sheet for Healthcare Providers: http://kim-miller.com/  This test is not yet approved or  cleared by the [place] FDA and has been authorized for detection and/or diagnosis of SARS-CoV-2 by FDA under an Emergency Use Authorization (EUA).  This EUA will remain in effect (meaning this test can be used) for the duration of the COVID-19 declaration under Section 564(b)(1) of the Act, 21 U.S.C. section 360bbb-3(b)(1), unless the authorization is terminated or revoked sooner.  Performed at [facility], [address] [place], [place] [address]   Respiratory (~20 pathogens) panel by PCR     Status: None   Collection Time: 10/01/22  3:45 AM   Specimen: Nasopharyngeal Swab; Respiratory  Result Value Ref Range Status   Adenovirus NOT DETECTED NOT DETECTED Final   Coronavirus 229E NOT DETECTED NOT DETECTED Final    Comment: (NOTE) The Coronavirus on the Respiratory Panel, DOES NOT test for the novel  Coronavirus (2019 nCoV)    Coronavirus HKU1 NOT DETECTED NOT DETECTED Final   Coronavirus NL63 NOT DETECTED NOT DETECTED Final   Coronavirus OC43 NOT DETECTED NOT DETECTED Final   Metapneumovirus NOT DETECTED NOT DETECTED Final   Rhinovirus / Enterovirus NOT DETECTED NOT DETECTED Final   Influenza A NOT DETECTED NOT DETECTED Final   Influenza B NOT DETECTED NOT DETECTED Final   Parainfluenza Virus 1 NOT DETECTED NOT DETECTED Final   Parainfluenza Virus 2 NOT DETECTED NOT DETECTED Final   Parainfluenza Virus 3 NOT DETECTED NOT DETECTED Final   Parainfluenza Virus 4 NOT DETECTED NOT DETECTED Final   Respiratory  Syncytial Virus NOT DETECTED NOT DETECTED Final   Bordetella pertussis NOT DETECTED NOT DETECTED Final   Bordetella Parapertussis NOT DETECTED NOT DETECTED Final   Chlamydophila pneumoniae NOT DETECTED NOT DETECTED Final   Mycoplasma pneumoniae NOT DETECTED NOT DETECTED Final    Comment: Performed at [facility], [address] [place], [place] [address]  Culture, blood (Routine X 2) w Reflex to ID Panel     Status: None (Preliminary result)   Collection Time: 10/01/22  7:00 AM   Specimen: BLOOD  Result Value Ref Range Status   Specimen Description BLOOD BLOOD LEFT HAND  Final   Special Requests   Final    BOTTLES DRAWN AEROBIC AND ANAEROBIC Blood Culture adequate volume   Culture   Final    NO GROWTH 2 DAYS Performed at [facility], [address] [place],  [place] [address]    Report Status PENDING  Incomplete  Culture, blood (Routine X 2) w Reflex to ID Panel     Status: None (Preliminary result)   Collection Time: 10/01/22  7:00 AM   Specimen: BLOOD  Result Value Ref Range Status   Specimen Description BLOOD BLOOD LEFT ARM  Final   Special Requests   Final    BOTTLES DRAWN AEROBIC AND ANAEROBIC Blood Culture adequate volume   Culture   Final    NO GROWTH 2 DAYS Performed at [facility] [address], [place], [place] [address]    Report Status PENDING  Incomplete  MRSA Next Gen by PCR, Nasal     Status: None   Collection Time: 10/01/22  8:30 AM   Specimen: Nasal Mucosa; Nasal Swab  Result Value Ref Range Status   MRSA by PCR Next Gen NOT DETECTED NOT DETECTED Final    Comment: (NOTE) The GeneXpert MRSA Assay (FDA approved for NASAL specimens only), is one component of a comprehensive MRSA colonization surveillance program. It is not intended to diagnose MRSA infection nor to guide or monitor treatment for MRSA infections. Test performance is not FDA approved in patients less than 18 years old. Performed at [facility], [address] [place], [place] [address]   Culture, Respiratory w Gram Stain     Status: None (Preliminary result)   Collection Time: 10/01/22  9:48 AM   Specimen: Tracheal Aspirate; Respiratory  Result Value Ref Range Status   Specimen Description   Final    TRACHEAL ASPIRATE Performed at [facility], [address] [place], [place] [address]    Special Requests   Final    NONE Performed at [facility], [address] [place], [place] [address]    Gram Stain   Final    FEW WBC SEEN FEW SQUAMOUS EPITHELIAL CELLS PRESENT FEW GRAM POSITIVE COCCI    Culture   Final    CULTURE REINCUBATED FOR BETTER GROWTH Performed at [facility], [address] [place], [place] [address]    Report Status PENDING  Incomplete  Surgical pcr screen     Status: Abnormal   Collection Time: 10/02/22  3:49 PM   Specimen: Nasal Mucosa; Nasal Swab  Result Value Ref Range Status   MRSA, PCR POSITIVE (A) NEGATIVE Final    Comment: CRITICAL RESULT CALLED TO, READ BACK BY AND VERIFIED WITH:  Windle Guard RN AT 1829 10/02/22 JG    Staphylococcus aureus POSITIVE (A) NEGATIVE Final    Comment: (NOTE) The Xpert SA Assay (FDA approved for NASAL specimens in patients 68 years of age and older), is one component of a comprehensive surveillance program. It is not intended to diagnose infection nor to guide or monitor treatment. Performed at [facility], [address] [place], [place] [address]   Culture, blood (Routine X 2) w Reflex to ID Panel     Status: None (Preliminary result)   Collection Time: 10/02/22  4:11 PM   Specimen: BLOOD  Result Value Ref Range Status   Specimen Description BLOOD BLOOD LEFT HAND  Final   Special Requests   Final    BOTTLES DRAWN AEROBIC AND ANAEROBIC Blood Culture results may not be optimal due to an inadequate volume of blood received in culture bottles   Culture   Final    NO GROWTH < 12 HOURS Performed at [facility],  [address] [place], [place] [address]  Report Status PENDING  Incomplete  Culture, blood (Routine X 2) w Reflex to ID Panel     Status: None (Preliminary result)   Collection Time: 10/02/22  7:46 PM   Specimen: BLOOD RIGHT ARM  Result Value Ref Range Status   Specimen Description BLOOD RIGHT ARM  Final   Special Requests   Final    BOTTLES DRAWN AEROBIC AND ANAEROBIC Blood Culture results may not be optimal due to an inadequate volume of blood received in culture bottles   Culture   Final    NO GROWTH < 12 HOURS Performed at [facility], [address] [place], [place] [address]    Report Status PENDING  Incomplete    Lab Basic Metabolic Panel: Recent Labs  Lab 09/23/2022 2225 10/01/22 0418 10/01/22 1128 10/01/22 1251 10/01/22 1614 10/01/22 1834 10/02/22 0455 10/02/22 1549 10/02/22 1843  NA  --    < > 134* 134*  --  137 133*  135 135 135  K  --    < > 3.4* 3.4*  --  4.0 4.9  4.9 5.4* 5.6*  CL  --    < > 103 104  --  105 108  110 105 102  CO2  --    < > 20* 21*  --  22 16*  17* 16* 14*  GLUCOSE  --    < > 285* 276*  --  147* 104*  111* 153* 211*  BUN  --    < > 54* 54*  --  57* 54*  55* 56* 61*  CREATININE  --    < > 2.45* 2.50*  --  2.57* 2.71*  2.63* 2.96* 3.18*  CALCIUM  --    < > 8.4* 8.6*  --  8.4* 8.3*  8.4* 8.4* 8.5*  MG 2.5*  --  2.9*  --  2.9*  --  3.2* 2.9*  --   PHOS  --   --   --   --   --   --  4.7* 6.0*  --    < > = values in this interval not displayed.   Liver Function Tests: Recent Labs  Lab 10/01/22 0418 10/01/22 1128 10/02/22 0455  AST 36 129* 68*  ALT 36 84* 76*  ALKPHOS 183* 199* 189*  BILITOT 1.1 1.7* 1.0  PROT 5.9* 4.9* 5.6*  ALBUMIN 2.7* 2.4* 2.5*  2.4*   No results for input(s): "LIPASE", "AMYLASE" in the last 168 hours. No results for input(s): "AMMONIA" in the last 168 hours. CBC: Recent Labs  Lab 10/08/2022 1952 10/01/22 0416 10/02/22 0455  WBC 11.7* 10.4 16.1*  NEUTROABS  --  7.3  --   HGB 12.3*  12.1* 11.4*  HCT 36.9* 36.6* 34.7*  MCV 92.5 93.4 93.3  PLT 266 269 305   Cardiac Enzymes: No results for input(s): "CKTOTAL", "CKMB", "CKMBINDEX", "TROPONINI" in the last 168 hours. Sepsis Labs: Recent Labs  Lab 09/28/2022 1952 10/01/22 0416 10/01/22 0604 10/02/22 0018 10/02/22 0454 10/02/22 0455 10/02/22 1550 10/02/22 1843  PROCALCITON  --   --   --   --   --  1.70  --   --   WBC 11.7* 10.4  --   --   --  16.1*  --   --   LATICACIDVEN  --   --    < > 1.1 1.3  --  3.0* 3.9*   < > = values in this interval not displayed.    Procedures/Operations  See procedure  notes     Webb Silversmith, DNP, CCRN, FNP-C, AGACNP-BC Acute Care & Family Nurse Practitioner  [facility]  See Amion for personal pager PCCM on call pager [phone] until 7 am

## 2022-10-11 DEATH — deceased

## 2022-11-04 ENCOUNTER — Ambulatory Visit: Payer: PRIVATE HEALTH INSURANCE
